# Patient Record
Sex: Male | Born: 1940
Health system: Southern US, Community
[De-identification: ages and names within clinical notes are randomized; demographics above are authoritative.]

## PROBLEM LIST (undated history)

## (undated) DIAGNOSIS — M48062 Spinal stenosis, lumbar region with neurogenic claudication: Secondary | ICD-10-CM

## (undated) DIAGNOSIS — Z974 Presence of external hearing-aid: Secondary | ICD-10-CM

## (undated) DIAGNOSIS — R011 Cardiac murmur, unspecified: Secondary | ICD-10-CM

## (undated) DIAGNOSIS — K5792 Diverticulitis of intestine, part unspecified, without perforation or abscess without bleeding: Secondary | ICD-10-CM

## (undated) DIAGNOSIS — M1710 Unilateral primary osteoarthritis, unspecified knee: Secondary | ICD-10-CM

## (undated) DIAGNOSIS — H9190 Unspecified hearing loss, unspecified ear: Secondary | ICD-10-CM

## (undated) DIAGNOSIS — M179 Osteoarthritis of knee, unspecified: Secondary | ICD-10-CM

## (undated) DIAGNOSIS — I10 Essential (primary) hypertension: Secondary | ICD-10-CM

## (undated) DIAGNOSIS — E785 Hyperlipidemia, unspecified: Secondary | ICD-10-CM

## (undated) DIAGNOSIS — R7303 Prediabetes: Secondary | ICD-10-CM

## (undated) DIAGNOSIS — K279 Peptic ulcer, site unspecified, unspecified as acute or chronic, without hemorrhage or perforation: Secondary | ICD-10-CM

## (undated) HISTORY — DX: Osteoarthritis of knee, unspecified: M17.9

## (undated) HISTORY — DX: Unspecified hearing loss, unspecified ear: H91.90

## (undated) HISTORY — DX: Unilateral primary osteoarthritis, unspecified knee: M17.10

## (undated) HISTORY — PX: OTHER SURGICAL HISTORY: SHX169

## (undated) HISTORY — DX: Hyperlipidemia, unspecified: E78.5

## (undated) HISTORY — DX: Essential (primary) hypertension: I10

---

## 1964-04-25 HISTORY — PX: TONSILLECTOMY AND ADENOIDECTOMY: SUR1326

## 2007-11-12 ENCOUNTER — Ambulatory Visit: Payer: Self-pay | Admitting: Gastroenterology

## 2008-04-25 HISTORY — PX: OTHER SURGICAL HISTORY: SHX169

## 2010-04-27 ENCOUNTER — Ambulatory Visit: Payer: Self-pay | Admitting: Internal Medicine

## 2010-05-26 HISTORY — PX: MENISCUS DEBRIDEMENT: SHX5178

## 2010-05-31 ENCOUNTER — Ambulatory Visit: Payer: Self-pay | Admitting: Anesthesiology

## 2010-06-02 ENCOUNTER — Ambulatory Visit: Payer: Self-pay | Admitting: General Practice

## 2010-07-25 HISTORY — PX: RETINAL LASER PROCEDURE: SHX2339

## 2010-08-06 ENCOUNTER — Ambulatory Visit: Payer: Self-pay | Admitting: Ophthalmology

## 2010-11-11 ENCOUNTER — Ambulatory Visit: Payer: Self-pay | Admitting: Ophthalmology

## 2010-11-17 ENCOUNTER — Ambulatory Visit: Payer: Self-pay | Admitting: Ophthalmology

## 2010-11-24 HISTORY — PX: CATARACT EXTRACTION W/ INTRAOCULAR LENS IMPLANT: SHX1309

## 2011-06-20 ENCOUNTER — Ambulatory Visit: Payer: Self-pay | Admitting: General Practice

## 2011-06-20 LAB — URINALYSIS, COMPLETE
Bilirubin,UR: NEGATIVE
Glucose,UR: NEGATIVE mg/dL (ref 0–75)
Ketone: NEGATIVE
Nitrite: NEGATIVE
Ph: 6 (ref 4.5–8.0)
Protein: NEGATIVE
RBC,UR: <1 /HPF (ref 0–5)
Specific Gravity: 1.014 (ref 1.003–1.030)
WBC UR: <1 /HPF (ref 0–5)

## 2011-06-20 LAB — BASIC METABOLIC PANEL
Calcium, Total: 9.2 mg/dL (ref 8.5–10.1)
Co2: 29 mmol/L (ref 21–32)
EGFR (African American): >60
EGFR (Non-African Amer.): >60
Glucose: 98 mg/dL (ref 65–99)
Osmolality: 278 (ref 275–301)
Potassium: 4 mmol/L (ref 3.5–5.1)
Sodium: 138 mmol/L (ref 136–145)

## 2011-06-20 LAB — CBC
HCT: 42.3 % (ref 40.0–52.0)
HGB: 14.1 g/dL (ref 13.0–18.0)
MCH: 28.8 pg (ref 26.0–34.0)
MCHC: 33.3 g/dL (ref 32.0–36.0)
MCV: 87 fL (ref 80–100)
RBC: 4.88 10*6/uL (ref 4.40–5.90)
RDW: 14.1 % (ref 11.5–14.5)
WBC: 8.4 10*3/uL (ref 3.8–10.6)

## 2011-06-20 LAB — APTT: Activated PTT: 26.2 secs (ref 23.6–35.9)

## 2011-06-21 LAB — URINE CULTURE

## 2011-06-24 HISTORY — PX: JOINT REPLACEMENT: SHX530

## 2011-07-06 ENCOUNTER — Inpatient Hospital Stay: Payer: Self-pay | Admitting: General Practice

## 2011-07-06 LAB — PLATELET COUNT: Platelet: 262 10*3/uL (ref 150–440)

## 2011-07-06 LAB — CREATININE, SERUM
EGFR (African American): >60
EGFR (Non-African Amer.): >60

## 2011-07-07 LAB — BASIC METABOLIC PANEL
Anion Gap: 8 (ref 7–16)
Calcium, Total: 8.2 mg/dL — ABNORMAL LOW (ref 8.5–10.1)
Chloride: 98 mmol/L (ref 98–107)
Co2: 29 mmol/L (ref 21–32)

## 2011-07-07 LAB — HEMOGLOBIN: HGB: 12 g/dL — ABNORMAL LOW (ref 13.0–18.0)

## 2011-07-07 LAB — PLATELET COUNT: Platelet: 221 10*3/uL (ref 150–440)

## 2011-07-08 LAB — BASIC METABOLIC PANEL
Anion Gap: 10 (ref 7–16)
Calcium, Total: 8.5 mg/dL (ref 8.5–10.1)
Chloride: 99 mmol/L (ref 98–107)
Co2: 28 mmol/L (ref 21–32)
Creatinine: 0.85 mg/dL (ref 0.60–1.30)
EGFR (African American): >60
EGFR (Non-African Amer.): >60
Glucose: 116 mg/dL — ABNORMAL HIGH (ref 65–99)
Osmolality: 275 (ref 275–301)
Potassium: 4 mmol/L (ref 3.5–5.1)
Sodium: 137 mmol/L (ref 136–145)

## 2011-07-27 ENCOUNTER — Encounter: Payer: Self-pay | Admitting: Internal Medicine

## 2011-07-27 ENCOUNTER — Ambulatory Visit (INDEPENDENT_AMBULATORY_CARE_PROVIDER_SITE_OTHER): Payer: Medicare Other | Admitting: Internal Medicine

## 2011-07-27 VITALS — BP 128/70 | HR 69 | Temp 98.5°F | Ht 70.5 in | Wt 249.0 lb

## 2011-07-27 DIAGNOSIS — I10 Essential (primary) hypertension: Secondary | ICD-10-CM

## 2011-07-27 DIAGNOSIS — E785 Hyperlipidemia, unspecified: Secondary | ICD-10-CM

## 2011-07-27 DIAGNOSIS — M1710 Unilateral primary osteoarthritis, unspecified knee: Secondary | ICD-10-CM

## 2011-07-27 DIAGNOSIS — M179 Osteoarthritis of knee, unspecified: Secondary | ICD-10-CM

## 2011-07-27 NOTE — Assessment & Plan Note (Signed)
BP Readings from Last 3 Encounters:  07/27/11 128/70   Good control No changes needed Will request and review past records

## 2011-07-27 NOTE — Patient Instructions (Signed)
Please request records from [facility]---3 years notes and labs, all consults and x-rays and procedures

## 2011-07-27 NOTE — Assessment & Plan Note (Signed)
Dicussed primary prevention Will continue the statin No sure he needs fish oil or niacin Will review next time

## 2011-07-27 NOTE — Progress Notes (Signed)
Subjective:    Patient ID: Evan Giles, male    DOB: Sep 19, 1940, 71 y.o.   MRN: [id number]  HPI Here to establish Had 3 different doctors in 3 years at [facility]  Has high blood pressure that goes back 3 years Does try to exercise regularly at [employer] (though not since knee surgery) Has tried to eat better---weight down 12# in past 3 months  Has high cholesterol Rx for about 3 years Had trouble with pravastatin and one other generic  Takes it only 3 days per week  Osteoarthritis in left knee Just had TKR 3 weeks ago--Dr [facility]  Current Outpatient Prescriptions on File Prior to Visit  Medication Sig Dispense Refill  . CRESTOR 5 MG tablet Take 5 mg by mouth daily.       Marland Kitchen lisinopril-hydrochlorothiazide (PRINZIDE,ZESTORETIC) 20-12.5 MG per tablet Take 1 tablet by mouth daily.        No Known Allergies  Past Medical History  Diagnosis Date  . Hyperlipidemia   . Hypertension   . Osteoarthrosis of knee     Past Surgical History  Procedure Date  . Meniscus debridement 2/12    Dr Dorise Bullion  . Joint replacement 3/12    Left knee---Dr Hooten  . Retinal laser procedure 4/12    Dr Monte Fantasia  . Cataract extraction w/ intraocular lens implant 8/12  . Tonsillectomy and adenoidectomy 1966    Family History  Problem Relation Age of Onset  . Heart disease Father   . Stroke Father     not certain about this  . Cancer Sister     unknown  . Diabetes Maternal Grandmother     History   Social History  . Marital Status: Married    Spouse Name: N/A    Number of Children: 2  . 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 Smoking status: Never Smoker   . Smokeless tobacco: Never Used  . Alcohol Use: No  . Drug Use: No  . Sexually Active: Not on file   Other Topics Concern  . Not on file   Social History Narrative   I son and 1 daughterNo living willRequests  wife as health care POAWould accept resuscitationProbably would accept feeding tube   Review of Systems  Constitutional: Negative for fatigue and unexpected weight change.       Has lost some weight trying to eat better  HENT: Positive for hearing loss. Negative for dental problem.        Regular with dentist  Eyes:       Retina surgery and cataract surgery on left eye  Respiratory: Negative for cough, chest tightness and shortness of breath.   Cardiovascular: Negative for chest pain, palpitations and leg swelling.  Gastrointestinal: Positive for constipation. Negative for nausea, vomiting and blood in stool.       Some constipation with the pain meds  Genitourinary: Negative for frequency and difficulty urinating.  Musculoskeletal: Positive for arthralgias. Negative for back pain.  Skin: Negative for rash.       Sees derm regularly Actinics Rx'd regularly  Neurological: Negative for dizziness, syncope, light-headedness and headaches.  Hematological: Negative for adenopathy. Does not bruise/bleed easily.  Psychiatric/Behavioral: Positive for sleep disturbance. Negative for dysphoric mood. The patient is not nervous/anxious.        Some sleep problems due to the surgery  Objective:   Physical Exam  Constitutional: He appears well-developed and well-nourished. No distress.  HENT:  Mouth/Throat: Oropharynx is clear and moist. No oropharyngeal exudate.  Eyes: Conjunctivae and EOM are normal. Pupils are equal, round, and reactive to light.  Neck: Normal range of motion. Neck supple. No thyromegaly present.  Cardiovascular: Normal rate, regular rhythm, normal heart sounds and intact distal pulses.  Exam reveals no gallop.   No murmur heard. Pulmonary/Chest: Effort normal and breath sounds normal. No respiratory distress. He has no wheezes. He has no rales.  Abdominal: Soft. There is no tenderness.  Musculoskeletal: He exhibits no edema and no tenderness.  Lymphadenopathy:    He  has no cervical adenopathy.  Skin: No rash noted. No erythema.       Multiple benign lesions--mostly seb keratoses, several nevi  Psychiatric: He has a normal mood and affect. His behavior is normal. Judgment and thought content normal.          Assessment & Plan:

## 2011-07-27 NOTE — Assessment & Plan Note (Signed)
Doing well post-op 

## 2011-08-23 ENCOUNTER — Encounter: Payer: Self-pay | Admitting: Internal Medicine

## 2012-04-04 ENCOUNTER — Encounter: Payer: Self-pay | Admitting: Internal Medicine

## 2012-04-04 ENCOUNTER — Ambulatory Visit (INDEPENDENT_AMBULATORY_CARE_PROVIDER_SITE_OTHER): Payer: Medicare Other | Admitting: Internal Medicine

## 2012-04-04 VITALS — BP 138/76 | HR 71 | Temp 98.4°F | Ht 71.0 in | Wt 262.0 lb

## 2012-04-04 DIAGNOSIS — I10 Essential (primary) hypertension: Secondary | ICD-10-CM

## 2012-04-04 DIAGNOSIS — E785 Hyperlipidemia, unspecified: Secondary | ICD-10-CM

## 2012-04-04 DIAGNOSIS — Z0000 Encounter for general adult medical examination without abnormal findings: Secondary | ICD-10-CM

## 2012-04-04 DIAGNOSIS — E669 Obesity, unspecified: Secondary | ICD-10-CM

## 2012-04-04 LAB — CBC WITH DIFFERENTIAL/PLATELET
Basophils Relative: 0.5 % (ref 0.0–3.0)
Eosinophils Absolute: 0.3 10*3/uL (ref 0.0–0.7)
HCT: 41.7 % (ref 39.0–52.0)
Lymphs Abs: 1.7 10*3/uL (ref 0.7–4.0)
MCHC: 33.9 g/dL (ref 30.0–36.0)
MCV: 85.6 fl (ref 78.0–100.0)
Monocytes Absolute: 0.7 10*3/uL (ref 0.1–1.0)
Neutrophils Relative %: 68.5 % (ref 43.0–77.0)
Platelets: 292 10*3/uL (ref 150.0–400.0)

## 2012-04-04 LAB — BASIC METABOLIC PANEL
BUN: 15 mg/dL (ref 6–23)
CO2: 29 mEq/L (ref 19–32)
Chloride: 103 mEq/L (ref 96–112)
Creatinine, Ser: 1 mg/dL (ref 0.4–1.5)

## 2012-04-04 LAB — HEPATIC FUNCTION PANEL
ALT: 13 U/L (ref 0–53)
Albumin: 4.2 g/dL (ref 3.5–5.2)
Total Bilirubin: 0.6 mg/dL (ref 0.3–1.2)

## 2012-04-04 LAB — TSH: TSH: 0.95 u[IU]/mL (ref 0.35–5.50)

## 2012-04-04 LAB — LDL CHOLESTEROL, DIRECT: Direct LDL: 135.9 mg/dL

## 2012-04-04 LAB — LIPID PANEL: Cholesterol: 194 mg/dL (ref 0–200)

## 2012-04-04 MED ORDER — LOSARTAN POTASSIUM-HCTZ 100-12.5 MG PO TABS: 1.0000 | ORAL_TABLET | Freq: Every day | ORAL | Status: DC

## 2012-04-04 NOTE — Assessment & Plan Note (Signed)
Discussed  Gave booklet--"On your Way to Fitness"

## 2012-04-04 NOTE — Assessment & Plan Note (Signed)
Healthy but out of shape Discussed PSA--he doesn't want to check UTD with colon and imms

## 2012-04-04 NOTE — Assessment & Plan Note (Signed)
BP Readings from Last 3 Encounters:  04/04/12 138/76  07/27/11 128/70   Will change to losartan due to cough
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
## 2012-04-04 NOTE — Assessment & Plan Note (Signed)
Takes crestor 3 days per week Check labs

## 2012-06-09 ENCOUNTER — Other Ambulatory Visit: Payer: Self-pay

## 2013-01-21 ENCOUNTER — Other Ambulatory Visit: Payer: Self-pay

## 2013-01-21 MED ORDER — ROSUVASTATIN CALCIUM 5 MG PO TABS: 5.0000 mg | ORAL_TABLET | Freq: Every day | ORAL | Status: DC

## 2013-01-21 NOTE — Telephone Encounter (Signed)
Pt request 90 day rx sent to [facility] 5 mg taking one tab daily. Advised done.

## 2013-02-28 ENCOUNTER — Other Ambulatory Visit: Payer: Self-pay

## 2013-04-04 ENCOUNTER — Encounter: Payer: Self-pay | Admitting: Internal Medicine

## 2013-04-04 ENCOUNTER — Ambulatory Visit (INDEPENDENT_AMBULATORY_CARE_PROVIDER_SITE_OTHER): Payer: Medicare Other | Admitting: Internal Medicine

## 2013-04-04 VITALS — BP 140/80 | HR 86 | Temp 98.5°F | Ht 71.0 in | Wt 257.0 lb

## 2013-04-04 DIAGNOSIS — I10 Essential (primary) hypertension: Secondary | ICD-10-CM

## 2013-04-04 DIAGNOSIS — R7303 Prediabetes: Secondary | ICD-10-CM | POA: Insufficient documentation

## 2013-04-04 DIAGNOSIS — Z0000 Encounter for general adult medical examination without abnormal findings: Secondary | ICD-10-CM

## 2013-04-04 DIAGNOSIS — E785 Hyperlipidemia, unspecified: Secondary | ICD-10-CM

## 2013-04-04 DIAGNOSIS — R7301 Impaired fasting glucose: Secondary | ICD-10-CM

## 2013-04-04 LAB — BASIC METABOLIC PANEL
BUN: 18 mg/dL (ref 6–23)
CO2: 30 mEq/L (ref 19–32)
Calcium: 9.1 mg/dL (ref 8.4–10.5)
Chloride: 103 mEq/L (ref 96–112)
GFR: 72.46 mL/min (ref >60.00)
Glucose, Bld: 115 mg/dL — ABNORMAL HIGH (ref 70–99)
Sodium: 139 mEq/L (ref 135–145)

## 2013-04-04 LAB — CBC WITH DIFFERENTIAL/PLATELET
Basophils Absolute: 0 10*3/uL (ref 0.0–0.1)
Eosinophils Absolute: 0.3 10*3/uL (ref 0.0–0.7)
HCT: 40.4 % (ref 39.0–52.0)
Hemoglobin: 13.4 g/dL (ref 13.0–17.0)
Lymphocytes Relative: 25.7 % (ref 12.0–46.0)
Lymphs Abs: 2.1 10*3/uL (ref 0.7–4.0)
MCHC: 33.3 g/dL (ref 30.0–36.0)
Monocytes Relative: 10.7 % (ref 3.0–12.0)
Platelets: 315 10*3/uL (ref 150.0–400.0)
RDW: 14 % (ref 11.5–14.6)

## 2013-04-04 LAB — HEMOGLOBIN A1C: Hgb A1c MFr Bld: 6.4 % (ref 4.6–6.5)

## 2013-04-04 LAB — HEPATIC FUNCTION PANEL
Albumin: 4.3 g/dL (ref 3.5–5.2)
Alkaline Phosphatase: 62 U/L (ref 39–117)
Total Protein: 7.2 g/dL (ref 6.0–8.3)

## 2013-04-04 LAB — LIPID PANEL
Cholesterol: 201 mg/dL — ABNORMAL HIGH (ref 0–200)
Total CHOL/HDL Ratio: 6

## 2013-04-04 LAB — LDL CHOLESTEROL, DIRECT: Direct LDL: 138.5 mg/dL

## 2013-04-04 LAB — TSH: TSH: 0.42 u[IU]/mL (ref 0.35–5.50)

## 2013-04-04 MED ORDER — LOSARTAN POTASSIUM-HCTZ 100-12.5 MG PO TABS: 1.0000 | ORAL_TABLET | Freq: Every day | ORAL | Status: DC

## 2013-04-04 NOTE — Assessment & Plan Note (Signed)
BP Readings from Last 3 Encounters:  04/04/13 140/80  04/04/12 138/76  07/27/11 128/70   Cough with lisinopril---thought he had itching with losartan so went back to lisinopril  Now wants to go back to losartan

## 2013-04-04 NOTE — Progress Notes (Signed)
Pre-visit discussion using our clinic review tool. No additional management support is needed unless otherwise documented below in the visit note.  

## 2013-04-04 NOTE — Patient Instructions (Signed)
DASH Diet  The DASH diet stands for "Dietary Approaches to Stop Hypertension." It is a healthy eating plan that has been shown to reduce high blood pressure (hypertension) in as little as 14 days, while also possibly providing other significant health benefits. These other health benefits include reducing the risk of breast cancer after menopause and reducing the risk of type 2 diabetes, heart disease, colon cancer, and stroke. Health benefits also include weight loss and slowing kidney failure in patients with chronic kidney disease.   DIET GUIDELINES  · Limit salt (sodium). Your diet should contain less than 1500 mg of sodium daily.  · Limit refined or processed carbohydrates. Your diet should include mostly whole grains. Desserts and added sugars should be used sparingly.  · Include small amounts of heart-healthy fats. These types of fats include nuts, oils, and tub margarine. Limit saturated and trans fats. These fats have been shown to be harmful in the body.  CHOOSING FOODS   The following food groups are based on a 2000 calorie diet. See your Registered Dietitian for individual calorie needs.  Grains and Grain Products (6 to 8 servings daily)  · Eat More Often: Whole-wheat bread, brown rice, whole-grain or wheat pasta, quinoa, popcorn without added fat or salt (air popped).  · Eat Less Often: White bread, white pasta, white rice, cornbread.  Vegetables (4 to 5 servings daily)  · Eat More Often: Fresh, frozen, and canned vegetables. Vegetables may be raw, steamed, roasted, or grilled with a minimal amount of fat.  · Eat Less Often/Avoid: Creamed or fried vegetables. Vegetables in a cheese sauce.  Fruit (4 to 5 servings daily)  · Eat More Often: All fresh, canned (in natural juice), or frozen fruits. Dried fruits without added sugar. One hundred percent fruit juice (½ cup [237 mL] daily).  · Eat Less Often: Dried fruits with added sugar. Canned fruit in light or heavy syrup.  Lean Meats, Fish, and Poultry (2  servings or less daily. One serving is 3 to 4 oz [85-114 g]).  · Eat More Often: Ninety percent or leaner ground beef, tenderloin, sirloin. Round cuts of beef, chicken breast, turkey breast. All fish. Grill, bake, or broil your meat. Nothing should be fried.  · Eat Less Often/Avoid: Fatty cuts of meat, turkey, or chicken leg, thigh, or wing. Fried cuts of meat or fish.  Dairy (2 to 3 servings)  · Eat More Often: Low-fat or fat-free milk, low-fat plain or light yogurt, reduced-fat or part-skim cheese.  · Eat Less Often/Avoid: Milk (whole, 2%). Whole milk yogurt. Full-fat cheeses.  Nuts, Seeds, and Legumes (4 to 5 servings per week)  · Eat More Often: All without added salt.  · Eat Less Often/Avoid: Salted nuts and seeds, canned beans with added salt.  Fats and Sweets (limited)  · Eat More Often: Vegetable oils, tub margarines without trans fats, sugar-free gelatin. Mayonnaise and salad dressings.  · Eat Less Often/Avoid: Coconut oils, palm oils, butter, stick margarine, cream, half and half, cookies, candy, pie.  FOR MORE INFORMATION  The Dash Diet Eating Plan: www.dashdiet.org  Document Released: 03/31/2011 Document Revised: 07/04/2011 Document Reviewed: 03/31/2011  Exit[employer]® Patient Information ©2014 [employer].

## 2013-04-04 NOTE — Assessment & Plan Note (Signed)
Will check A1c
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
## 2013-04-04 NOTE — Assessment & Plan Note (Addendum)
Healthy but still needs to work on fitness NO PSA due to age

## 2013-04-04 NOTE — Assessment & Plan Note (Signed)
No problems with statin 

## 2013-12-14 ENCOUNTER — Other Ambulatory Visit: Payer: Self-pay | Admitting: Internal Medicine

## 2014-04-07 ENCOUNTER — Ambulatory Visit (INDEPENDENT_AMBULATORY_CARE_PROVIDER_SITE_OTHER): Payer: Medicare Other | Admitting: Internal Medicine

## 2014-04-07 ENCOUNTER — Encounter: Payer: Self-pay | Admitting: Internal Medicine

## 2014-04-07 VITALS — BP 128/70 | HR 64 | Temp 98.5°F | Ht 71.5 in | Wt 261.0 lb

## 2014-04-07 DIAGNOSIS — R7301 Impaired fasting glucose: Secondary | ICD-10-CM

## 2014-04-07 DIAGNOSIS — I10 Essential (primary) hypertension: Secondary | ICD-10-CM

## 2014-04-07 DIAGNOSIS — Z23 Encounter for immunization: Secondary | ICD-10-CM

## 2014-04-07 DIAGNOSIS — Z0000 Encounter for general adult medical examination without abnormal findings: Secondary | ICD-10-CM

## 2014-04-07 DIAGNOSIS — E785 Hyperlipidemia, unspecified: Secondary | ICD-10-CM

## 2014-04-07 MED ORDER — ATORVASTATIN CALCIUM 20 MG PO TABS: 20.0000 mg | ORAL_TABLET | Freq: Every day | ORAL | Status: DC

## 2014-04-07 NOTE — Progress Notes (Signed)
Pre visit review using our clinic review tool, if applicable. No additional management support is needed unless otherwise documented below in the visit note. 

## 2014-04-07 NOTE — Addendum Note (Signed)
Addended by: Despina Hidden on: 04/07/2014 12:51 PM   Modules accepted: Orders, Medications

## 2014-04-07 NOTE — Assessment & Plan Note (Signed)
BP Readings from Last 3 Encounters:  04/07/14 128/70  04/04/13 140/80  04/04/12 138/76   Good control No changes needed

## 2014-04-07 NOTE — Patient Instructions (Addendum)
Please set up blood work in about 6 weeks--- Met C, CBC, free T4, lipid, A1c   DASH Eating Plan DASH stands for "Dietary Approaches to Stop Hypertension." The DASH eating plan is a healthy eating plan that has been shown to reduce high blood pressure (hypertension). Additional health benefits may include reducing the risk of type 2 diabetes mellitus, heart disease, and stroke. The DASH eating plan may also help with weight loss. WHAT DO I NEED TO KNOW ABOUT THE DASH EATING PLAN? For the DASH eating plan, you will follow these general guidelines:  Choose foods with a percent daily value for sodium of less than 5% (as listed on the food label).  Use salt-free seasonings or herbs instead of table salt or sea salt.  Check with your health care provider or pharmacist before using salt substitutes.  Eat lower-sodium products, often labeled as "lower sodium" or "no salt added."  Eat fresh foods.  Eat more vegetables, fruits, and low-fat dairy products.  Choose whole grains. Look for the word "whole" as the first word in the ingredient list.  Choose fish and skinless chicken or [place] more often than red meat. Limit fish, poultry, and meat to 6 oz (170 g) each day.  Limit sweets, desserts, sugars, and sugary drinks.  Choose heart-healthy fats.  Limit cheese to 1 oz (28 g) per day.  Eat more home-cooked food and less restaurant, buffet, and fast food.  Limit fried foods.  Cook foods using methods other than frying.  Limit canned vegetables. If you do use them, rinse them well to decrease the sodium.  When eating at a restaurant, ask that your food be prepared with less salt, or no salt if possible. WHAT FOODS CAN I EAT? Seek help from a dietitian for individual calorie needs. Grains Whole grain or whole wheat bread. Brown rice. Whole grain or whole wheat pasta. Quinoa, bulgur, and whole grain cereals. Low-sodium cereals. Corn or whole wheat flour tortillas. Whole grain cornbread.  Whole grain crackers. Low-sodium crackers. Vegetables Fresh or frozen vegetables (raw, steamed, roasted, or grilled). Low-sodium or reduced-sodium tomato and vegetable juices. Low-sodium or reduced-sodium tomato sauce and paste. Low-sodium or reduced-sodium canned vegetables.  Fruits All fresh, canned (in natural juice), or frozen fruits. Meat and Other Protein Products Ground beef (85% or leaner), grass-fed beef, or beef trimmed of fat. Skinless chicken or [place]. Ground chicken or [place]. Pork trimmed of fat. All fish and seafood. Eggs. Dried beans, peas, or lentils. Unsalted nuts and seeds. Unsalted canned beans. Dairy Low-fat dairy products, such as skim or 1% milk, 2% or reduced-fat cheeses, low-fat ricotta or cottage cheese, or plain low-fat yogurt. Low-sodium or reduced-sodium cheeses. Fats and Oils Tub margarines without trans fats. Light or reduced-fat mayonnaise and salad dressings (reduced sodium). Avocado. Safflower, olive, or canola oils. Natural peanut or almond butter. Other Unsalted popcorn and pretzels. The items listed above may not be a complete list of recommended foods or beverages. Contact your dietitian for more options. WHAT FOODS ARE NOT RECOMMENDED? Grains White bread. White pasta. White rice. Refined cornbread. Bagels and croissants. Crackers that contain trans fat. Vegetables Creamed or fried vegetables. Vegetables in a cheese sauce. Regular canned vegetables. Regular canned tomato sauce and paste. Regular tomato and vegetable juices. Fruits Dried fruits. Canned fruit in light or heavy syrup. Fruit juice. Meat and Other Protein Products Fatty cuts of meat. Ribs, chicken wings, bacon, sausage, bologna, salami, chitterlings, fatback, hot dogs, bratwurst, and packaged luncheon meats. Salted nuts and seeds. Canned  beans with salt. Dairy Whole or 2% milk, cream, half-and-half, and cream cheese. Whole-fat or sweetened yogurt. Full-fat cheeses or blue cheese. Nondairy  creamers and whipped toppings. Processed cheese, cheese spreads, or cheese curds. Condiments Onion and garlic salt, seasoned salt, table salt, and sea salt. Canned and packaged gravies. Worcestershire sauce. Tartar sauce. Barbecue sauce. Teriyaki sauce. Soy sauce, including reduced sodium. Steak sauce. Fish sauce. Oyster sauce. Cocktail sauce. Horseradish. Ketchup and mustard. Meat flavorings and tenderizers. Bouillon cubes. Hot sauce. Tabasco sauce. Marinades. Taco seasonings. Relishes. Fats and Oils Butter, stick margarine, lard, shortening, ghee, and bacon fat. Coconut, palm kernel, or palm oils. Regular salad dressings. Other Pickles and olives. Salted popcorn and pretzels. The items listed above may not be a complete list of foods and beverages to avoid. Contact your dietitian for more information. WHERE CAN I FIND MORE INFORMATION? National Heart, Lung, and Blood Institute: travelstabloid.com Document Released: 03/31/2011 Document Revised: 08/26/2013 Document Reviewed: 02/13/2013 [facility] Patient Information 2015 [place], [place]. This information is not intended to replace advice given to you by your health care provider. Make sure you discuss any questions you have with your health care provider.

## 2014-04-07 NOTE — Assessment & Plan Note (Signed)
Hopefully not diabetic  Will check labs soon

## 2014-04-07 NOTE — Assessment & Plan Note (Signed)
I have personally reviewed the Medicare Annual Wellness questionnaire and have noted 1. The patient's medical and social history 2. Their use of alcohol, tobacco or illicit drugs 3. Their current medications and supplements 4. The patient's functional ability including ADL's, fall risks, home safety risks and hearing or visual             impairment. 5. Diet and physical activities 6. Evidence for depression or mood disorders  The patients weight, height, BMI and visual acuity have been recorded in the chart I have made referrals, counseling and provided education to the patient based review of the above and I have provided the pt with a written personalized care plan for preventive services.  I have provided you with a copy of your personalized plan for preventive services. Please take the time to review along with your updated medication list.  Will give prevnar Colonoscopy due 2019 Discussed fitness and lifestyle No PSA due to age

## 2014-04-07 NOTE — Assessment & Plan Note (Signed)
He requests change to generic LDL was ~130 last year Will change to atorvastatin 20

## 2014-04-07 NOTE — Progress Notes (Signed)
Subjective:    Patient ID: Evan Giles, male    DOB: July 15, 1940, 73 y.o.   MRN: [id number]  HPI Here for Medicare wellness and physical Reviewed form and advanced directives Reviewed other physicians No alcohol or tobacco Does do some exercise 5 days per week. Plans to start "diet" Hearing is poor---got hearing aides Vision is fine Independent with all instrumental ADLs No cognitive problems No falls No depression or anhedonia  Discussed his sugars Needs to work on his eating Fairly consistent with exercise  Current Outpatient Prescriptions on File Prior to Visit  Medication Sig Dispense Refill  . aspirin 81 MG tablet Take 81 mg by mouth daily.    . CRESTOR 5 MG tablet TAKE ONE TABLET BY MOUTH EVERY DAY 90 tablet 0  . losartan-hydrochlorothiazide (HYZAAR) 100-12.5 MG per tablet Take 1 tablet by mouth daily. 90 tablet 3   No current facility-administered medications on file prior to visit.    No Known Allergies  Past Medical History  Diagnosis Date  . Hyperlipidemia   . Hypertension   . Osteoarthrosis of knee     Past Surgical History  Procedure Laterality Date  . Meniscus debridement  2/12    Dr Roberts Gaudy  . Joint replacement  3/13    Left knee---Dr Hooten  . Retinal laser procedure  4/12    Dr Darrick Grinder  . Cataract extraction w/ intraocular lens implant  8/12  . Tonsillectomy and adenoidectomy  1966    Family History  Problem Relation Age of Onset  . Heart disease Father   . Stroke Father     not certain about this  . Cancer Sister     unknown  . Diabetes Maternal Grandmother     History   Social History  . Marital Status: Married    Spouse Name: N/A    Number of Children: 2  . 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 Smoking status: Never Smoker   . Smokeless tobacco: Never Used  . Alcohol Use: No  . Drug Use: No  . Sexual  Activity: Not on file   Other Topics Concern  . Not on file   Social History Narrative   I son and 1 daughter   No living will   Requests wife as health care POA--plans to formalize   Would accept resuscitation   Probably would accept feeding tube--at least for a limited time   Review of Systems  Constitutional: Negative for fatigue and unexpected weight change.  HENT: Negative for dental problem, hearing loss and tinnitus.        Regular with dentist  Eyes: Negative for visual disturbance.       No diplopia or unilateral vision loss  Respiratory: Negative for cough, chest tightness and shortness of breath.   Cardiovascular: Negative for chest pain, palpitations and leg swelling.  Gastrointestinal: Negative for nausea and vomiting.       Rare heartburn --tums helps (once a month or less) Colonoscopy 4/09--benign  Endocrine: Negative for polydipsia and polyuria.  Genitourinary: Negative for urgency, frequency and difficulty urinating.       No sexual problems  Musculoskeletal: Negative for back pain, joint swelling and arthralgias.  Skin: Negative for rash.       Sees Dr Phillip Heal for derm---excision of benign lesion on back in past year  Allergic/Immunologic: Negative for environmental allergies and immunocompromised state.  Neurological: Negative for dizziness, syncope, weakness, light-headedness, numbness and headaches.  Hematological: Negative for adenopathy. Does not bruise/bleed easily.  Psychiatric/Behavioral: Negative for suicidal ideas and dysphoric mood. The patient is not nervous/anxious.        Objective:   Physical Exam  Constitutional: He is oriented to person, place, and time. He appears well-developed and well-nourished. No distress.  HENT:  Head: Normocephalic and atraumatic.  Right Ear: External ear normal.  Left Ear: External ear normal.  Mouth/Throat: Oropharynx is clear and moist. No oropharyngeal exudate.  Eyes: Conjunctivae and EOM are normal. Pupils are  equal, round, and reactive to light.  Neck: Normal range of motion. Neck supple. No thyromegaly present.  Cardiovascular: Normal rate, regular rhythm, normal heart sounds and intact distal pulses.  Exam reveals no gallop.   No murmur heard. Pulmonary/Chest: Effort normal and breath sounds normal. No respiratory distress. He has no wheezes. He has no rales.  Abdominal: Soft. There is no tenderness.  Musculoskeletal: He exhibits no edema or tenderness.  Lymphadenopathy:    He has no cervical adenopathy.  Neurological: He is alert and oriented to person, place, and time.  President-- "Obama, Bush, CLinton" [phone] [id number] Recall 3/3  Skin: No rash noted. No erythema.  Psychiatric: He has a normal mood and affect. His behavior is normal.          Assessment & Plan:

## 2014-04-29 ENCOUNTER — Other Ambulatory Visit: Payer: Self-pay | Admitting: Internal Medicine

## 2014-04-29 DIAGNOSIS — M5412 Radiculopathy, cervical region: Secondary | ICD-10-CM | POA: Diagnosis not present

## 2014-04-29 DIAGNOSIS — M9903 Segmental and somatic dysfunction of lumbar region: Secondary | ICD-10-CM | POA: Diagnosis not present

## 2014-04-29 DIAGNOSIS — M5136 Other intervertebral disc degeneration, lumbar region: Secondary | ICD-10-CM | POA: Diagnosis not present

## 2014-04-29 DIAGNOSIS — M9901 Segmental and somatic dysfunction of cervical region: Secondary | ICD-10-CM | POA: Diagnosis not present

## 2014-05-19 ENCOUNTER — Other Ambulatory Visit: Payer: Self-pay | Admitting: Internal Medicine

## 2014-05-19 ENCOUNTER — Other Ambulatory Visit: Payer: Medicare Other

## 2014-05-19 DIAGNOSIS — I10 Essential (primary) hypertension: Secondary | ICD-10-CM

## 2014-06-03 DIAGNOSIS — M5412 Radiculopathy, cervical region: Secondary | ICD-10-CM | POA: Diagnosis not present

## 2014-06-03 DIAGNOSIS — M9903 Segmental and somatic dysfunction of lumbar region: Secondary | ICD-10-CM | POA: Diagnosis not present

## 2014-06-03 DIAGNOSIS — M9901 Segmental and somatic dysfunction of cervical region: Secondary | ICD-10-CM | POA: Diagnosis not present

## 2014-06-03 DIAGNOSIS — M5136 Other intervertebral disc degeneration, lumbar region: Secondary | ICD-10-CM | POA: Diagnosis not present

## 2014-07-02 DIAGNOSIS — M9901 Segmental and somatic dysfunction of cervical region: Secondary | ICD-10-CM | POA: Diagnosis not present

## 2014-07-02 DIAGNOSIS — M5412 Radiculopathy, cervical region: Secondary | ICD-10-CM | POA: Diagnosis not present

## 2014-07-02 DIAGNOSIS — M5136 Other intervertebral disc degeneration, lumbar region: Secondary | ICD-10-CM | POA: Diagnosis not present

## 2014-07-02 DIAGNOSIS — M9903 Segmental and somatic dysfunction of lumbar region: Secondary | ICD-10-CM | POA: Diagnosis not present

## 2014-07-30 DIAGNOSIS — M5136 Other intervertebral disc degeneration, lumbar region: Secondary | ICD-10-CM | POA: Diagnosis not present

## 2014-07-30 DIAGNOSIS — M9903 Segmental and somatic dysfunction of lumbar region: Secondary | ICD-10-CM | POA: Diagnosis not present

## 2014-07-30 DIAGNOSIS — M9901 Segmental and somatic dysfunction of cervical region: Secondary | ICD-10-CM | POA: Diagnosis not present

## 2014-07-30 DIAGNOSIS — M5412 Radiculopathy, cervical region: Secondary | ICD-10-CM | POA: Diagnosis not present

## 2014-08-11 DIAGNOSIS — D2371 Other benign neoplasm of skin of right lower limb, including hip: Secondary | ICD-10-CM | POA: Diagnosis not present

## 2014-08-11 DIAGNOSIS — L821 Other seborrheic keratosis: Secondary | ICD-10-CM | POA: Diagnosis not present

## 2014-08-17 NOTE — Discharge Summary (Signed)
PATIENT NAME:  Evan Giles, Evan Giles MR#:  [id number] DATE OF BIRTH:  [birth year]  DATE OF ADMISSION:  07/06/2011 DATE OF DISCHARGE:  07/09/2011  ADMITTING DIAGNOSIS: Degenerative arthrosis of the left knee.   DISCHARGE DIAGNOSIS: Degenerative arthrosis of the left knee.   HISTORY: The patient is a pleasant 74 year old who has been followed at [facility] for progression of left knee discomfort. The patient is status post left knee arthroscopy for a partial medial meniscectomy as well as chondroplasty. At that time he was noted to have grade IV changes of chondromalacia to the medial compartment space and grade 3 changes to the patellofemoral compartment. He had reported relative constant pain and swelling of the knee that was aggravated with activities. His pain was noted to be increased with walking and prolonged standing. He had localized most of the pain along the medial aspect of the knee. At the time of surgery, he was not using any ambulatory aid. The patient had denied any gross locking or giving away of the knee. He had not seen any significant improvement in his condition despite activity modification as well as over-the-counter anti-inflammatory medications. The pain had increased to the point that it was significantly interfering with his activities of daily living. X-rays taken in the clinic showed narrowing of the medial cartilage space with associated varus alignment. Osteophyte formation was noted as well as subchondral sclerosis. After a discussion of the risks and benefits of surgical intervention, the patient expressed his understanding of the risks and benefits and agreed for plans for surgical intervention.   PROCEDURE: Left total knee arthroplasty using computer-assisted navigation.   ANESTHESIA:  Femoral nerve block with spinal.   SOFT TISSUE RELEASE: Anterior cruciate ligament, posterior cruciate ligament, deep and superficial medial collateral ligament, as well as the  patellofemoral ligament.   IMPLANTS UTILIZED: DePuy PFC Sigma size five posterior stabilized femoral component (cemented), size six MBT tibial component (cemented), 41 mm three pegged oval dome patella (cemented), and a 10 mm stabilized rotating platform polyethylene insert.   HOSPITAL COURSE: The patient tolerated the procedure very well. He had no complications. He was then taken to the PAC-U where he was stabilized and then transferred to the orthopedic floor. The patient began receiving anticoagulation therapy of Lovenox 30 mg subcutaneous every 12 hours per anesthesia and pharmacy protocol. He was fitted with TED stockings bilaterally. These were allowed to be removed one hour per eight hour shift. The left one was applied on day two following removal of the Hemovac and dressing change. He was also fitted with the AV-I compression foot pump set at 80 mmHg. His calves have been nontender, free of any evidence of any deep venous thromboses. Negative Homans sign. Heels were elevated off the bed using rolled towels. Heels have been nontender.   The patient has denied any chest pain or shortness of breath. Vital signs have been stable. He has been afebrile. Hemodynamically, he was stable. No transfusions were given other than the Autovac transfusion given the first six hours postoperatively.   Physical therapy was initiated on day one for gait training and transfers. He has done extremely well. Upon being discharged, was ambulating greater than 200 feet. He was able to go up and down four sets of steps. He was independent with bed to chair transfers. Occupational therapy was also initiated on day one for activities of daily living and assistive devices.   DISPOSITION: The patient was noted to be nauseous on several occasions during the hospital course, but  not on a constant basis. Upon being discharged, this appeared to have cleared.   The patient's IV, catheter, and Hemovac were all discontinued on day  two along with a dressing change. The Polar Care was reapplied to the surgical leg maintaining a temperature of 40 to 50 degrees Fahrenheit. The wound has been free of any drainage or any signs of infection.   DISPOSITION: The patient is being discharged to home in improved stable condition.   DISCHARGE INSTRUCTIONS:  1. He will continue weight-bearing as tolerated.  2. Continue to use a walker until cleared by physical therapy to go to a quad cane.  3. He will wear TED stockings during the day but may remove these at night.  4. He will receive home health physical therapy.  5. He is placed on a regular diet.  6. He is to resume his regular medications that he was on prior to admission.  7. He was given a prescription for Lovenox 40 mg subcutaneously daily for 14 days, then discontinue and begin taking one 81 mg enteric-coated aspirin.  8. He was given a prescription for oxycodone 1 to 2 tablets every 4 to 6 hours p.r.n. for pain.  9. The patient has a follow-up appointment in two weeks for dressing change. He is to call the clinic sooner if any temperatures of 101.5 or greater or excessive bleeding.   PAST MEDICAL HISTORY:  1. Chickenpox. 2. Hypertension.  3. Hyperlipidemia.   ____________________________ Vance Peper, PA jrw:ap D: 07/09/2011 07:54:25 ET T: 07/09/2011 14:14:58 ET JOB#: [id number]  cc: Vance Peper, PA, <Dictator> Earnesteen Birnie PA ELECTRONICALLY SIGNED 07/12/2011 20:52

## 2014-08-17 NOTE — Op Note (Signed)
PATIENT NAME:  Evan Giles, Evan Giles MR#:  [id number] DATE OF BIRTH:  Mar 01, 1941  DATE OF PROCEDURE:  07/06/2011  PREOPERATIVE DIAGNOSIS: Degenerative arthrosis of the left knee.   POSTOPERATIVE DIAGNOSIS: Degenerative arthrosis of the left  knee.   PROCEDURE PERFORMED: Left total knee arthroplasty using computer-assisted navigation.   SURGEON: Laurice Record. Holley Bouche., MD  ASSISTANT: Vance Peper, PA-C (required to maintain retraction throughout the procedure)   ANESTHESIA: Femoral nerve block and spinal.   ESTIMATED BLOOD LOSS: 50 mL.   FLUIDS REPLACED: 1300 mL of crystalloid.   TOURNIQUET TIME: 100 minutes.   DRAINS: Two medium drains to reinfusion system.   SOFT TISSUE RELEASES: Anterior cruciate ligament, posterior cruciate ligament, deep and superficial medial collateral ligament, patellofemoral ligament.   IMPLANTS UTILIZED: DePuy PFC Sigma size 5 posterior stabilized femoral component (cemented), size 6 MBT tibial component (cemented), 41 mm three-peg oval dome patella (cemented), and a 10 mm stabilized rotating platform polyethylene insert.   INDICATIONS FOR SURGERY: The patient is a 74 year old male who has been seen for complaints of progressive left knee pain. X-rays demonstrated significant degenerative changes with tricompartmental arthrosis and relative varus deformity. After a discussion of the risks and benefits of surgical intervention, the patient expressed understanding of the risks and benefits and agreed with plans for surgical intervention.   PROCEDURE IN DETAIL: The patient was brought into the Operating Room, and after adequate femoral nerve block and spinal anesthesia was achieved, a tourniquet was placed on the patient's upper left thigh. The patient's left knee and leg were cleaned and prepped with alcohol and DuraPrep and draped in the usual sterile fashion. A "timeout" was performed as per usual protocol. The left lower extremity was exsanguinated using an Esmarch,  and the tourniquet was inflated to 300 mmHg. An anterior longitudinal incision was made, followed by a standard mid vastus approach. A small effusion was evacuated. The deep fibers of the medial collateral ligament were elevated in a subperiosteal fashion off the medial flare of the tibia so as to maintain a continuous soft tissue sleeve. The patella was subluxed laterally and the patellofemoral ligament was incised. Inspection of the knee demonstrated severe degenerative changes most notably to the medial compartment. Anterior and posterior cruciate ligaments were excised. Two 4.0 mm Schanz pins were inserted into the femur and into the tibia for attachment of the array of spheres used for computer-assisted navigation. Hip center was identified using the circumduction technique. Distal landmarks were mapped using the computer. The distal femur and proximal tibia were mapped using the computer. The distal femoral cutting guide was positioned using computer-assisted navigation so as to achieve a 5-degree distal valgus cut. Cut was performed and verified using the computer. The distal femur was sized, and it was felt that a size 5 femoral component was appropriate. A size 5 cutting guide was positioned using computer-assisted navigation, and the anterior cut was performed and verified using the computer. This was followed by completion of the posterior and chamfer cuts. The cutting guide for the femoral box was positioned, and a femoral box cut was performed.   Attention was then directed to the proximal tibia. Medial and lateral menisci were excised. The extramedullary tibial cutting guide was positioned using computer-assisted navigation so as to achieve 0-degree varus-valgus alignment and 0-degree posterior slope. Cut was performed and verified using the computer. The proximal tibia was sized, and it was felt that a size 6 tibial tray was appropriate. Tibial and femoral trials were inserted, followed by insertion  of a 10 mm polyethylene insert. The knee was felt to be tight medially. A Cobb elevator was used to elevate the superficial fibers of the medial collateral ligament. This allowed for good medial and lateral soft tissue balancing both in full extension and in 90 degrees of flexion. Finally, the patella was cut and prepared so as to accommodate a 41 mm three-peg oval dome patella. Patellar trial was placed, and the knee was placed through a range of motion. Excellent patellar tracking was appreciated. The femoral trial was removed after debridement of osteophyte off of the posterior medial femoral condyle. The central post hole for the tibial component was reamed followed by insertion of a keel punch. The tibial trial was then removed. Cut surfaces of bone were irrigated with copious amounts of normal saline with antibiotic solution using pulsatile lavage and then suctioned dry. Polymethylmethacrylate cement was prepared in the usual fashion using a vacuum mixer. Cement was applied to the cut surface of the proximal tibia as well as along the undersurface of a size 6 MBT tibial component. The tibial component was positioned and impacted into place. Excess cement was removed using freer elevators. Cement was then applied to the cut surface of the femur as well as along the posterior flanges of a size 5 posterior stabilized femoral component. The femoral component was positioned and impacted into place. Excess cement was removed using freer elevators. A 10 mm polyethylene trial was inserted, and the knee was brought in full extension with steady axial compression applied. Finally, cement was applied to the backside of a 41 mm three-peg oval dome patella, and the patellar component was positioned and patellar clamp applied. Excess cement was removed using freer elevators.   After adequate curing of cement, the tourniquet was deflated after a total tourniquet time of 100 minutes. Good hemostasis was appreciated. The  knee was irrigated with copious amounts of normal saline with antibiotic solution using pulsatile lavage and then suctioned dry. The knee was inspected for any residual cement debris, and 30 mL of 0.25% Marcaine with epinephrine was injected along the posterior capsule. A 10 mm stabilized rotating platform polyethylene insert was inserted, and the knee was placed through a range of motion. Excellent patellar tracking was appreciated, and good medial and lateral soft tissue balancing was appreciated both in full extension and in 90 degrees of flexion.   Two medium drains were placed in the wound bed and brought out through a separate stab incision to be attached to a reinfusion system. The medial parapatellar portion of the incision was reapproximated using interrupted sutures of #1 Vicryl. The subcutaneous tissue was approximated in layers using first #0 Vicryl, followed by #2-0 Vicryl. Skin was closed with skin staples. A sterile dressing was applied.   The patient tolerated the procedure well. He was transported to the recovery room in stable condition.    ____________________________ Laurice Record. Holley Bouche., MD [email or web address]:cbb D: 07/06/2011 13:55:20 ET T: 07/06/2011 16:06:24 ET JOB#: [id number]  cc: Jeneen Rinks P. Holley Bouche., MD, <Dictator> Laurice Record Holley Bouche MD ELECTRONICALLY SIGNED 07/07/2011 18:10

## 2014-08-26 DIAGNOSIS — Z96652 Presence of left artificial knee joint: Secondary | ICD-10-CM | POA: Diagnosis not present

## 2014-08-26 DIAGNOSIS — M25552 Pain in left hip: Secondary | ICD-10-CM | POA: Diagnosis not present

## 2014-09-04 DIAGNOSIS — M5136 Other intervertebral disc degeneration, lumbar region: Secondary | ICD-10-CM | POA: Diagnosis not present

## 2014-09-04 DIAGNOSIS — M5412 Radiculopathy, cervical region: Secondary | ICD-10-CM | POA: Diagnosis not present

## 2014-09-04 DIAGNOSIS — M9903 Segmental and somatic dysfunction of lumbar region: Secondary | ICD-10-CM | POA: Diagnosis not present

## 2014-09-04 DIAGNOSIS — M9901 Segmental and somatic dysfunction of cervical region: Secondary | ICD-10-CM | POA: Diagnosis not present

## 2014-09-16 DIAGNOSIS — Z961 Presence of intraocular lens: Secondary | ICD-10-CM | POA: Diagnosis not present

## 2014-10-01 DIAGNOSIS — M9903 Segmental and somatic dysfunction of lumbar region: Secondary | ICD-10-CM | POA: Diagnosis not present

## 2014-10-01 DIAGNOSIS — M5136 Other intervertebral disc degeneration, lumbar region: Secondary | ICD-10-CM | POA: Diagnosis not present

## 2014-10-01 DIAGNOSIS — M5412 Radiculopathy, cervical region: Secondary | ICD-10-CM | POA: Diagnosis not present

## 2014-10-01 DIAGNOSIS — M9901 Segmental and somatic dysfunction of cervical region: Secondary | ICD-10-CM | POA: Diagnosis not present

## 2014-10-29 DIAGNOSIS — M9903 Segmental and somatic dysfunction of lumbar region: Secondary | ICD-10-CM | POA: Diagnosis not present

## 2014-10-29 DIAGNOSIS — M5136 Other intervertebral disc degeneration, lumbar region: Secondary | ICD-10-CM | POA: Diagnosis not present

## 2014-10-29 DIAGNOSIS — M5412 Radiculopathy, cervical region: Secondary | ICD-10-CM | POA: Diagnosis not present

## 2014-10-29 DIAGNOSIS — M9901 Segmental and somatic dysfunction of cervical region: Secondary | ICD-10-CM | POA: Diagnosis not present

## 2014-11-06 DIAGNOSIS — M5412 Radiculopathy, cervical region: Secondary | ICD-10-CM | POA: Diagnosis not present

## 2014-11-06 DIAGNOSIS — M9903 Segmental and somatic dysfunction of lumbar region: Secondary | ICD-10-CM | POA: Diagnosis not present

## 2014-11-06 DIAGNOSIS — M5136 Other intervertebral disc degeneration, lumbar region: Secondary | ICD-10-CM | POA: Diagnosis not present

## 2014-11-06 DIAGNOSIS — M9901 Segmental and somatic dysfunction of cervical region: Secondary | ICD-10-CM | POA: Diagnosis not present

## 2014-11-17 DIAGNOSIS — S61411A Laceration without foreign body of right hand, initial encounter: Secondary | ICD-10-CM | POA: Diagnosis not present

## 2014-11-17 DIAGNOSIS — Z23 Encounter for immunization: Secondary | ICD-10-CM | POA: Diagnosis not present

## 2014-11-25 DIAGNOSIS — M5412 Radiculopathy, cervical region: Secondary | ICD-10-CM | POA: Diagnosis not present

## 2014-11-25 DIAGNOSIS — M9901 Segmental and somatic dysfunction of cervical region: Secondary | ICD-10-CM | POA: Diagnosis not present

## 2014-11-25 DIAGNOSIS — M5136 Other intervertebral disc degeneration, lumbar region: Secondary | ICD-10-CM | POA: Diagnosis not present

## 2014-11-25 DIAGNOSIS — M9903 Segmental and somatic dysfunction of lumbar region: Secondary | ICD-10-CM | POA: Diagnosis not present

## 2014-11-27 DIAGNOSIS — S61411A Laceration without foreign body of right hand, initial encounter: Secondary | ICD-10-CM | POA: Diagnosis not present

## 2014-12-30 DIAGNOSIS — M5412 Radiculopathy, cervical region: Secondary | ICD-10-CM | POA: Diagnosis not present

## 2014-12-30 DIAGNOSIS — M9903 Segmental and somatic dysfunction of lumbar region: Secondary | ICD-10-CM | POA: Diagnosis not present

## 2014-12-30 DIAGNOSIS — M5136 Other intervertebral disc degeneration, lumbar region: Secondary | ICD-10-CM | POA: Diagnosis not present

## 2014-12-30 DIAGNOSIS — M9901 Segmental and somatic dysfunction of cervical region: Secondary | ICD-10-CM | POA: Diagnosis not present

## 2015-01-28 DIAGNOSIS — D485 Neoplasm of uncertain behavior of skin: Secondary | ICD-10-CM | POA: Diagnosis not present

## 2015-01-28 DIAGNOSIS — C44629 Squamous cell carcinoma of skin of left upper limb, including shoulder: Secondary | ICD-10-CM | POA: Diagnosis not present

## 2015-01-28 DIAGNOSIS — L249 Irritant contact dermatitis, unspecified cause: Secondary | ICD-10-CM | POA: Diagnosis not present

## 2015-01-28 DIAGNOSIS — Z872 Personal history of diseases of the skin and subcutaneous tissue: Secondary | ICD-10-CM | POA: Diagnosis not present

## 2015-01-28 DIAGNOSIS — Z1283 Encounter for screening for malignant neoplasm of skin: Secondary | ICD-10-CM | POA: Diagnosis not present

## 2015-02-03 DIAGNOSIS — M9903 Segmental and somatic dysfunction of lumbar region: Secondary | ICD-10-CM | POA: Diagnosis not present

## 2015-02-03 DIAGNOSIS — M9901 Segmental and somatic dysfunction of cervical region: Secondary | ICD-10-CM | POA: Diagnosis not present

## 2015-02-03 DIAGNOSIS — M5136 Other intervertebral disc degeneration, lumbar region: Secondary | ICD-10-CM | POA: Diagnosis not present

## 2015-02-03 DIAGNOSIS — M5412 Radiculopathy, cervical region: Secondary | ICD-10-CM | POA: Diagnosis not present

## 2015-03-16 DIAGNOSIS — C44629 Squamous cell carcinoma of skin of left upper limb, including shoulder: Secondary | ICD-10-CM | POA: Diagnosis not present

## 2015-03-16 DIAGNOSIS — C44622 Squamous cell carcinoma of skin of right upper limb, including shoulder: Secondary | ICD-10-CM | POA: Diagnosis not present

## 2015-04-02 DIAGNOSIS — M9901 Segmental and somatic dysfunction of cervical region: Secondary | ICD-10-CM | POA: Diagnosis not present

## 2015-04-02 DIAGNOSIS — M5412 Radiculopathy, cervical region: Secondary | ICD-10-CM | POA: Diagnosis not present

## 2015-04-02 DIAGNOSIS — M5136 Other intervertebral disc degeneration, lumbar region: Secondary | ICD-10-CM | POA: Diagnosis not present

## 2015-04-02 DIAGNOSIS — M9903 Segmental and somatic dysfunction of lumbar region: Secondary | ICD-10-CM | POA: Diagnosis not present

## 2015-04-14 ENCOUNTER — Encounter: Payer: Self-pay | Admitting: Internal Medicine

## 2015-04-14 ENCOUNTER — Ambulatory Visit (INDEPENDENT_AMBULATORY_CARE_PROVIDER_SITE_OTHER): Payer: Medicare Other | Admitting: Internal Medicine

## 2015-04-14 VITALS — BP 138/70 | HR 61 | Temp 97.8°F | Ht 72.0 in | Wt 260.0 lb

## 2015-04-14 DIAGNOSIS — Z0000 Encounter for general adult medical examination without abnormal findings: Secondary | ICD-10-CM | POA: Diagnosis not present

## 2015-04-14 DIAGNOSIS — R7301 Impaired fasting glucose: Secondary | ICD-10-CM | POA: Diagnosis not present

## 2015-04-14 DIAGNOSIS — I10 Essential (primary) hypertension: Secondary | ICD-10-CM | POA: Diagnosis not present

## 2015-04-14 DIAGNOSIS — Z7189 Other specified counseling: Secondary | ICD-10-CM

## 2015-04-14 DIAGNOSIS — E785 Hyperlipidemia, unspecified: Secondary | ICD-10-CM

## 2015-04-14 LAB — COMPREHENSIVE METABOLIC PANEL
ALT: 17 U/L (ref 0–53)
AST: 22 U/L (ref 0–37)
Albumin: 4.2 g/dL (ref 3.5–5.2)
Alkaline Phosphatase: 77 U/L (ref 39–117)
BUN: 21 mg/dL (ref 6–23)
CALCIUM: 9.3 mg/dL (ref 8.4–10.5)
CHLORIDE: 102 meq/L (ref 96–112)
CO2: 29 meq/L (ref 19–32)
Creatinine, Ser: 0.91 mg/dL (ref 0.40–1.50)
GFR: 86.86 mL/min (ref >60.00)
Glucose, Bld: 102 mg/dL — ABNORMAL HIGH (ref 70–99)
POTASSIUM: 4.3 meq/L (ref 3.5–5.1)
Sodium: 137 mEq/L (ref 135–145)
Total Bilirubin: 0.5 mg/dL (ref 0.2–1.2)
Total Protein: 7.1 g/dL (ref 6.0–8.3)

## 2015-04-14 LAB — LIPID PANEL
CHOLESTEROL: 185 mg/dL (ref 0–200)
HDL: 35.1 mg/dL — ABNORMAL LOW (ref >39.00)
NonHDL: 149.75
TRIGLYCERIDES: 207 mg/dL — AB (ref 0.0–149.0)
Total CHOL/HDL Ratio: 5
VLDL: 41.4 mg/dL — AB (ref 0.0–40.0)

## 2015-04-14 LAB — LDL CHOLESTEROL, DIRECT: LDL DIRECT: 122 mg/dL

## 2015-04-14 LAB — CBC WITH DIFFERENTIAL/PLATELET
BASOS ABS: 0 10*3/uL (ref 0.0–0.1)
Basophils Relative: 0.6 % (ref 0.0–3.0)
EOS ABS: 0.3 10*3/uL (ref 0.0–0.7)
EOS PCT: 4 % (ref 0.0–5.0)
HCT: 43 % (ref 39.0–52.0)
HEMOGLOBIN: 14.1 g/dL (ref 13.0–17.0)
LYMPHS ABS: 1.9 10*3/uL (ref 0.7–4.0)
LYMPHS PCT: 23.8 % (ref 12.0–46.0)
MCHC: 32.7 g/dL (ref 30.0–36.0)
MCV: 86.1 fl (ref 78.0–100.0)
MONO ABS: 0.8 10*3/uL (ref 0.1–1.0)
Monocytes Relative: 10.3 % (ref 3.0–12.0)
NEUTROS PCT: 61.3 % (ref 43.0–77.0)
Neutro Abs: 5 10*3/uL (ref 1.4–7.7)
Platelets: 296 10*3/uL (ref 150.0–400.0)
RBC: 4.99 Mil/uL (ref 4.22–5.81)
RDW: 14 % (ref 11.5–15.5)
WBC: 8.2 10*3/uL (ref 4.0–10.5)

## 2015-04-14 LAB — HEMOGLOBIN A1C: Hgb A1c MFr Bld: 6.5 % (ref 4.6–6.5)

## 2015-04-14 NOTE — Assessment & Plan Note (Signed)
BP Readings from Last 3 Encounters:  04/14/15 138/70  04/07/14 128/70  04/04/13 140/80   Good control No change needed

## 2015-04-14 NOTE — Assessment & Plan Note (Signed)
I have personally reviewed the Medicare Annual Wellness questionnaire and have noted 1. The patient's medical and social history 2. Their use of alcohol, tobacco or illicit drugs 3. Their current medications and supplements 4. The patient's functional ability including ADL's, fall risks, home safety risks and hearing or visual             impairment. 5. Diet and physical activities 6. Evidence for depression or mood disorders  The patients weight, height, BMI and visual acuity have been recorded in the chart I have made referrals, counseling and provided education to the patient based review of the above and I have provided the pt with a written personalized care plan for preventive services.  I have provided you with a copy of your personalized plan for preventive services. Please take the time to review along with your updated medication list.  UTD on imms Colon due 2019 No PSA due to age Discussed fitness, etc

## 2015-04-14 NOTE — Assessment & Plan Note (Signed)
Will check labs

## 2015-04-14 NOTE — Progress Notes (Signed)
Pre visit review using our clinic review tool, if applicable. No additional management support is needed unless otherwise documented below in the visit note. 

## 2015-04-14 NOTE — Patient Instructions (Signed)
DASH Eating Plan  DASH stands for "Dietary Approaches to Stop Hypertension." The DASH eating plan is a healthy eating plan that has been shown to reduce high blood pressure (hypertension). Additional health benefits may include reducing the risk of type 2 diabetes mellitus, heart disease, and stroke. The DASH eating plan may also help with weight loss.  WHAT DO I NEED TO KNOW ABOUT THE DASH EATING PLAN?  For the DASH eating plan, you will follow these general guidelines:  · Choose foods with a percent daily value for sodium of less than 5% (as listed on the food label).  · Use salt-free seasonings or herbs instead of table salt or sea salt.  · Check with your health care provider or pharmacist before using salt substitutes.  · Eat lower-sodium products, often labeled as "lower sodium" or "no salt added."  · Eat fresh foods.  · Eat more vegetables, fruits, and low-fat dairy products.  · Choose whole grains. Look for the word "whole" as the first word in the ingredient list.  · Choose fish and skinless chicken or turkey more often than red meat. Limit fish, poultry, and meat to 6 oz (170 g) each day.  · Limit sweets, desserts, sugars, and sugary drinks.  · Choose heart-healthy fats.  · Limit cheese to 1 oz (28 g) per day.  · Eat more home-cooked food and less restaurant, buffet, and fast food.  · Limit fried foods.  · Cook foods using methods other than frying.  · Limit canned vegetables. If you do use them, rinse them well to decrease the sodium.  · When eating at a restaurant, ask that your food be prepared with less salt, or no salt if possible.  WHAT FOODS CAN I EAT?  Seek help from a dietitian for individual calorie needs.  Grains  Whole grain or whole wheat bread. Brown rice. Whole grain or whole wheat pasta. Quinoa, bulgur, and whole grain cereals. Low-sodium cereals. Corn or whole wheat flour tortillas. Whole grain cornbread. Whole grain crackers. Low-sodium crackers.  Vegetables  Fresh or frozen vegetables  (raw, steamed, roasted, or grilled). Low-sodium or reduced-sodium tomato and vegetable juices. Low-sodium or reduced-sodium tomato sauce and paste. Low-sodium or reduced-sodium canned vegetables.   Fruits  All fresh, canned (in natural juice), or frozen fruits.  Meat and Other Protein Products  Ground beef (85% or leaner), grass-fed beef, or beef trimmed of fat. Skinless chicken or turkey. Ground chicken or turkey. Pork trimmed of fat. All fish and seafood. Eggs. Dried beans, peas, or lentils. Unsalted nuts and seeds. Unsalted canned beans.  Dairy  Low-fat dairy products, such as skim or 1% milk, 2% or reduced-fat cheeses, low-fat ricotta or cottage cheese, or plain low-fat yogurt. Low-sodium or reduced-sodium cheeses.  Fats and Oils  Tub margarines without trans fats. Light or reduced-fat mayonnaise and salad dressings (reduced sodium). Avocado. Safflower, olive, or canola oils. Natural peanut or almond butter.  Other  Unsalted popcorn and pretzels.  The items listed above may not be a complete list of recommended foods or beverages. Contact your dietitian for more options.  WHAT FOODS ARE NOT RECOMMENDED?  Grains  White bread. White pasta. White rice. Refined cornbread. Bagels and croissants. Crackers that contain trans fat.  Vegetables  Creamed or fried vegetables. Vegetables in a cheese sauce. Regular canned vegetables. Regular canned tomato sauce and paste. Regular tomato and vegetable juices.  Fruits  Dried fruits. Canned fruit in light or heavy syrup. Fruit juice.  Meat and Other Protein   Products  Fatty cuts of meat. Ribs, chicken wings, bacon, sausage, bologna, salami, chitterlings, fatback, hot dogs, bratwurst, and packaged luncheon meats. Salted nuts and seeds. Canned beans with salt.  Dairy  Whole or 2% milk, cream, half-and-half, and cream cheese. Whole-fat or sweetened yogurt. Full-fat cheeses or blue cheese. Nondairy creamers and whipped toppings. Processed cheese, cheese spreads, or cheese  curds.  Condiments  Onion and garlic salt, seasoned salt, table salt, and sea salt. Canned and packaged gravies. Worcestershire sauce. Tartar sauce. Barbecue sauce. Teriyaki sauce. Soy sauce, including reduced sodium. Steak sauce. Fish sauce. Oyster sauce. Cocktail sauce. Horseradish. Ketchup and mustard. Meat flavorings and tenderizers. Bouillon cubes. Hot sauce. Tabasco sauce. Marinades. Taco seasonings. Relishes.  Fats and Oils  Butter, stick margarine, lard, shortening, ghee, and bacon fat. Coconut, palm kernel, or palm oils. Regular salad dressings.  Other  Pickles and olives. Salted popcorn and pretzels.  The items listed above may not be a complete list of foods and beverages to avoid. Contact your dietitian for more information.  WHERE CAN I FIND MORE INFORMATION?  National Heart, Lung, and [employer] Institute: www.nhlbi.nih.gov/health/health-topics/topics/dash/     This information is not intended to replace advice given to you by your health care provider. Make sure you discuss any questions you have with your health care provider.     Document Released: 03/31/2011 Document Revised: 05/02/2014 Document Reviewed: 02/13/2013  Elsevier Interactive Patient Education ©2016 [employer] Inc.

## 2015-04-14 NOTE — Assessment & Plan Note (Signed)
Getting documents notarized soon

## 2015-04-14 NOTE — Assessment & Plan Note (Signed)
Doing okay on atorvastatin

## 2015-04-14 NOTE — Progress Notes (Signed)
Subjective:    Patient ID: Evan Giles, male    DOB: Aug 09, 1940, 74 y.o.   MRN: [id number]  HPI Here for Medicare wellness and follow up of chronic medical conditions Reviewed form and advanced directives Reviewed other doctors No tobacco or alcohol Tries to exercise regularly--- was busy with work in past 2 months Vision fine Hearing aides---they help No falls No depression or anhedonia Independent with instrumental ADLs No apparent cognitive problems  No new concerns Still works as IT trainer  No chest pain No SOB or change in exercise tolerance No dizziness or syncope No edema No palpitations  On statin No myalgias or GI problems  Current Outpatient Prescriptions on File Prior to Visit  Medication Sig Dispense Refill  . aspirin 81 MG tablet Take 81 mg by mouth daily.    Marland Kitchen atorvastatin (LIPITOR) 20 MG tablet Take 1 tablet (20 mg total) by mouth daily. 90 tablet 3  . CRESTOR 5 MG tablet TAKE ONE TABLET BY MOUTH EVERY DAY 90 tablet 3  . losartan-hydrochlorothiazide (HYZAAR) 100-12.5 MG per tablet TAKE ONE TABLET BY MOUTH EVERY DAY 90 tablet 3   No current facility-administered medications on file prior to visit.    Allergies  Allergen Reactions  . Hydrocodone Itching    Past Medical History  Diagnosis Date  . Hyperlipidemia   . Hypertension   . Osteoarthrosis of knee     Past Surgical History  Procedure Laterality Date  . Meniscus debridement  2/12    Dr Roberts Gaudy  . Joint replacement  3/13    Left knee---Dr Hooten  . Retinal laser procedure  4/12    Dr Darrick Grinder  . Cataract extraction w/ intraocular lens implant  8/12  . Tonsillectomy and adenoidectomy  1966    Family History  Problem Relation Age of Onset  . Heart disease Father   . Stroke Father     not certain about this  . Cancer Sister     unknown  . Diabetes Maternal Grandmother     Social History   Social History  . Marital Status: Married    Spouse Name: N/A  .  Number of Children: 2  . 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 Smoking status: Never Smoker   . Smokeless tobacco: Never Used  . Alcohol Use: No  . Drug Use: No  . Sexual Activity: Not on file   Other Topics Concern  . Not on file   Social History Narrative   I son and 1 daughter   Has living will   Requests wife as health care POA--plans to formalize (just needs notary)   Would accept resuscitation   Probably would accept feeding tube--at least for a limited time   Review of Systems Sleeps fair Appetite is "too good" Weight is stable Wears seat belt Teeth are fine--regular with dentist Bowels are fine Voids okay. Stream is okay. Stable nocturia x 1-2 Some left shoulder pain--known bone spur. No meds Has appt tomorrow with derm--needs cream and UV Rx    Objective:   Physical Exam  Constitutional: He is oriented to person, place, and time. He appears well-developed and well-nourished. No distress.  HENT:  Mouth/Throat: Oropharynx is clear and moist. No oropharyngeal exudate.  Neck: Normal range of motion. Neck supple. No thyromegaly present.  Cardiovascular: Normal rate, regular rhythm, normal heart sounds and intact distal pulses.  Exam reveals no gallop.   No murmur heard. Pulmonary/Chest: Effort normal and breath sounds normal. No respiratory distress. He has no wheezes. He has no rales.  Abdominal: Soft. There is no tenderness.  Musculoskeletal: He exhibits no edema or tenderness.  Lymphadenopathy:    He has no cervical adenopathy.  Neurological: He is alert and oriented to person, place, and time.  President-- "Obama, Bush, CLinton" [phone] [id number] Recall 3/3  Skin: No erythema.  Psychiatric: He has a normal mood and affect. His behavior is normal.          Assessment & Plan:

## 2015-04-15 DIAGNOSIS — C44629 Squamous cell carcinoma of skin of left upper limb, including shoulder: Secondary | ICD-10-CM | POA: Diagnosis not present

## 2015-04-15 DIAGNOSIS — C44622 Squamous cell carcinoma of skin of right upper limb, including shoulder: Secondary | ICD-10-CM | POA: Diagnosis not present

## 2015-04-21 ENCOUNTER — Other Ambulatory Visit: Payer: Self-pay | Admitting: Internal Medicine

## 2015-05-06 DIAGNOSIS — M9901 Segmental and somatic dysfunction of cervical region: Secondary | ICD-10-CM | POA: Diagnosis not present

## 2015-05-06 DIAGNOSIS — M5412 Radiculopathy, cervical region: Secondary | ICD-10-CM | POA: Diagnosis not present

## 2015-05-06 DIAGNOSIS — M5136 Other intervertebral disc degeneration, lumbar region: Secondary | ICD-10-CM | POA: Diagnosis not present

## 2015-05-06 DIAGNOSIS — M9903 Segmental and somatic dysfunction of lumbar region: Secondary | ICD-10-CM | POA: Diagnosis not present

## 2015-05-19 DIAGNOSIS — L2089 Other atopic dermatitis: Secondary | ICD-10-CM | POA: Diagnosis not present

## 2015-05-19 DIAGNOSIS — L821 Other seborrheic keratosis: Secondary | ICD-10-CM | POA: Diagnosis not present

## 2015-05-19 DIAGNOSIS — C44622 Squamous cell carcinoma of skin of right upper limb, including shoulder: Secondary | ICD-10-CM | POA: Diagnosis not present

## 2015-05-19 DIAGNOSIS — C44629 Squamous cell carcinoma of skin of left upper limb, including shoulder: Secondary | ICD-10-CM | POA: Diagnosis not present

## 2015-05-26 DIAGNOSIS — C44622 Squamous cell carcinoma of skin of right upper limb, including shoulder: Secondary | ICD-10-CM | POA: Diagnosis not present

## 2015-05-26 DIAGNOSIS — C44629 Squamous cell carcinoma of skin of left upper limb, including shoulder: Secondary | ICD-10-CM | POA: Diagnosis not present

## 2015-06-09 DIAGNOSIS — M9903 Segmental and somatic dysfunction of lumbar region: Secondary | ICD-10-CM | POA: Diagnosis not present

## 2015-06-09 DIAGNOSIS — M5136 Other intervertebral disc degeneration, lumbar region: Secondary | ICD-10-CM | POA: Diagnosis not present

## 2015-06-09 DIAGNOSIS — M9901 Segmental and somatic dysfunction of cervical region: Secondary | ICD-10-CM | POA: Diagnosis not present

## 2015-06-09 DIAGNOSIS — M5412 Radiculopathy, cervical region: Secondary | ICD-10-CM | POA: Diagnosis not present

## 2015-07-02 DIAGNOSIS — M5136 Other intervertebral disc degeneration, lumbar region: Secondary | ICD-10-CM | POA: Diagnosis not present

## 2015-07-02 DIAGNOSIS — M9903 Segmental and somatic dysfunction of lumbar region: Secondary | ICD-10-CM | POA: Diagnosis not present

## 2015-07-02 DIAGNOSIS — M5412 Radiculopathy, cervical region: Secondary | ICD-10-CM | POA: Diagnosis not present

## 2015-07-02 DIAGNOSIS — M9901 Segmental and somatic dysfunction of cervical region: Secondary | ICD-10-CM | POA: Diagnosis not present

## 2015-07-27 ENCOUNTER — Other Ambulatory Visit: Payer: Self-pay | Admitting: Internal Medicine

## 2015-07-29 DIAGNOSIS — M5412 Radiculopathy, cervical region: Secondary | ICD-10-CM | POA: Diagnosis not present

## 2015-07-29 DIAGNOSIS — M9901 Segmental and somatic dysfunction of cervical region: Secondary | ICD-10-CM | POA: Diagnosis not present

## 2015-07-29 DIAGNOSIS — M9903 Segmental and somatic dysfunction of lumbar region: Secondary | ICD-10-CM | POA: Diagnosis not present

## 2015-07-29 DIAGNOSIS — M5136 Other intervertebral disc degeneration, lumbar region: Secondary | ICD-10-CM | POA: Diagnosis not present

## 2015-08-06 DIAGNOSIS — G8929 Other chronic pain: Secondary | ICD-10-CM | POA: Diagnosis not present

## 2015-08-06 DIAGNOSIS — M25561 Pain in right knee: Secondary | ICD-10-CM | POA: Diagnosis not present

## 2015-08-06 DIAGNOSIS — M2391 Unspecified internal derangement of right knee: Secondary | ICD-10-CM | POA: Diagnosis not present

## 2015-08-06 DIAGNOSIS — Z96652 Presence of left artificial knee joint: Secondary | ICD-10-CM | POA: Diagnosis not present

## 2015-08-10 ENCOUNTER — Other Ambulatory Visit: Payer: Self-pay | Admitting: Orthopedic Surgery

## 2015-08-10 DIAGNOSIS — G8929 Other chronic pain: Secondary | ICD-10-CM

## 2015-08-10 DIAGNOSIS — M25561 Pain in right knee: Principal | ICD-10-CM

## 2015-08-11 ENCOUNTER — Ambulatory Visit
Admission: RE | Admit: 2015-08-11 | Discharge: 2015-08-11 | Disposition: A | Discharge status: Home / Self Care | Payer: Medicare Other | Source: Ambulatory Visit | Attending: Orthopedic Surgery | Admitting: Orthopedic Surgery

## 2015-08-11 DIAGNOSIS — M659 Synovitis and tenosynovitis, unspecified: Secondary | ICD-10-CM | POA: Insufficient documentation

## 2015-08-11 DIAGNOSIS — M1711 Unilateral primary osteoarthritis, right knee: Secondary | ICD-10-CM | POA: Diagnosis not present

## 2015-08-11 DIAGNOSIS — G8929 Other chronic pain: Secondary | ICD-10-CM | POA: Diagnosis not present

## 2015-08-11 DIAGNOSIS — S83281A Other tear of lateral meniscus, current injury, right knee, initial encounter: Secondary | ICD-10-CM | POA: Insufficient documentation

## 2015-08-11 DIAGNOSIS — M25561 Pain in right knee: Secondary | ICD-10-CM | POA: Insufficient documentation

## 2015-08-11 DIAGNOSIS — M25461 Effusion, right knee: Secondary | ICD-10-CM | POA: Diagnosis not present

## 2015-08-17 DIAGNOSIS — C44622 Squamous cell carcinoma of skin of right upper limb, including shoulder: Secondary | ICD-10-CM | POA: Diagnosis not present

## 2015-08-17 DIAGNOSIS — Z872 Personal history of diseases of the skin and subcutaneous tissue: Secondary | ICD-10-CM | POA: Diagnosis not present

## 2015-08-17 DIAGNOSIS — Z1283 Encounter for screening for malignant neoplasm of skin: Secondary | ICD-10-CM | POA: Diagnosis not present

## 2015-08-17 DIAGNOSIS — C44629 Squamous cell carcinoma of skin of left upper limb, including shoulder: Secondary | ICD-10-CM | POA: Diagnosis not present

## 2015-08-17 DIAGNOSIS — D485 Neoplasm of uncertain behavior of skin: Secondary | ICD-10-CM | POA: Diagnosis not present

## 2015-08-17 DIAGNOSIS — Z08 Encounter for follow-up examination after completed treatment for malignant neoplasm: Secondary | ICD-10-CM | POA: Diagnosis not present

## 2015-08-18 DIAGNOSIS — M2391 Unspecified internal derangement of right knee: Secondary | ICD-10-CM | POA: Diagnosis not present

## 2015-08-24 HISTORY — PX: OTHER SURGICAL HISTORY: SHX169

## 2015-08-26 DIAGNOSIS — M9901 Segmental and somatic dysfunction of cervical region: Secondary | ICD-10-CM | POA: Diagnosis not present

## 2015-08-26 DIAGNOSIS — M5412 Radiculopathy, cervical region: Secondary | ICD-10-CM | POA: Diagnosis not present

## 2015-08-26 DIAGNOSIS — M5136 Other intervertebral disc degeneration, lumbar region: Secondary | ICD-10-CM | POA: Diagnosis not present

## 2015-08-26 DIAGNOSIS — M9903 Segmental and somatic dysfunction of lumbar region: Secondary | ICD-10-CM | POA: Diagnosis not present

## 2015-08-31 ENCOUNTER — Encounter
Admission: RE | Admit: 2015-08-31 | Discharge: 2015-08-31 | Disposition: A | Discharge status: Home / Self Care | Payer: Medicare Other | Source: Ambulatory Visit | Attending: Orthopedic Surgery | Admitting: Orthopedic Surgery

## 2015-08-31 DIAGNOSIS — M9901 Segmental and somatic dysfunction of cervical region: Secondary | ICD-10-CM | POA: Diagnosis not present

## 2015-08-31 DIAGNOSIS — M9903 Segmental and somatic dysfunction of lumbar region: Secondary | ICD-10-CM | POA: Diagnosis not present

## 2015-08-31 DIAGNOSIS — M5136 Other intervertebral disc degeneration, lumbar region: Secondary | ICD-10-CM | POA: Diagnosis not present

## 2015-08-31 DIAGNOSIS — M5412 Radiculopathy, cervical region: Secondary | ICD-10-CM | POA: Diagnosis not present

## 2015-08-31 DIAGNOSIS — I10 Essential (primary) hypertension: Secondary | ICD-10-CM | POA: Diagnosis not present

## 2015-08-31 DIAGNOSIS — Z01812 Encounter for preprocedural laboratory examination: Secondary | ICD-10-CM | POA: Insufficient documentation

## 2015-08-31 HISTORY — DX: Peptic ulcer, site unspecified, unspecified as acute or chronic, without hemorrhage or perforation: K27.9

## 2015-08-31 LAB — POTASSIUM: Potassium: 4 mmol/L (ref 3.5–5.1)

## 2015-08-31 NOTE — Patient Instructions (Signed)
  Your procedure is scheduled on: 09-07-15 [facility]) Report to [facility] To find out your arrival time please call [phone] between 1PM - 3PM on 09-04-15 (FRIDAY)  Remember: Instructions that are not followed completely may result in serious medical risk, up to and including death, or upon the discretion of your surgeon and anesthesiologist your surgery may need to be rescheduled.    _X___ 1. Do not eat food or drink liquids after midnight. No gum chewing or hard candies.     _X___ 2. No Alcohol for 24 hours before or after surgery.   ____ 3. Bring all medications with you on the day of surgery if instructed.    _X___ 4. Notify your doctor if there is any change in your medical condition     (cold, fever, infections).     Do not wear jewelry, make-up, hairpins, clips or nail polish.  Do not wear lotions, powders, or perfumes. You may wear deodorant.  Do not shave 48 hours prior to surgery. Men may shave face and neck.  Do not bring valuables to the hospital.    [facility] is not responsible for any belongings or valuables.               Contacts, dentures or bridgework may not be worn into surgery.  Leave your suitcase in the car. After surgery it may be brought to your room.  For patients admitted to the hospital, discharge time is determined by your  treatment team.   Patients discharged the day of surgery will not be allowed to drive home.   Please read over the following fact sheets that you were given:     _X___ Take these medicines the morning of surgery with A SIP OF WATER:    1. CRESTOR (ROSUVASTATIN)  2. MAGNESIUM  3.   4.  5.  6.  ____ Fleet Enema (as directed)   _X___ Use CHG Soap as directed  ____ Use inhalers on the day of surgery  ____ Stop metformin 2 days prior to surgery    ____ Take 1/2 of usual insulin dose the night before surgery and none on the morning of surgery.   _X___ Stop Coumadin/Plavix/aspirin-STOP ASPIRIN  AS DIRECTED(10 DAYS PRIOR TO SURGERY)  _X___ Stop Anti-inflammatories-NO NSAIDS OR ASPIRIN PRODUCTS-TYLENOL OK TO TAKE   ____ Stop supplements until after surgery.    ____ Bring C-Pap to the hospital.

## 2015-09-07 ENCOUNTER — Encounter
Admission: RE | Disposition: A | Discharge status: Home / Self Care | Payer: Self-pay | Source: Ambulatory Visit | Attending: Orthopedic Surgery

## 2015-09-07 ENCOUNTER — Ambulatory Visit: Payer: Medicare Other | Admitting: Anesthesiology

## 2015-09-07 ENCOUNTER — Encounter: Payer: Self-pay | Admitting: *Deleted

## 2015-09-07 ENCOUNTER — Ambulatory Visit
Admission: RE | Admit: 2015-09-07 | Discharge: 2015-09-07 | Disposition: A | Discharge status: Home / Self Care | Payer: Medicare Other | Source: Ambulatory Visit | Attending: Orthopedic Surgery | Admitting: Orthopedic Surgery

## 2015-09-07 DIAGNOSIS — Z8249 Family history of ischemic heart disease and other diseases of the circulatory system: Secondary | ICD-10-CM | POA: Insufficient documentation

## 2015-09-07 DIAGNOSIS — Z96652 Presence of left artificial knee joint: Secondary | ICD-10-CM | POA: Diagnosis not present

## 2015-09-07 DIAGNOSIS — Z79899 Other long term (current) drug therapy: Secondary | ICD-10-CM | POA: Insufficient documentation

## 2015-09-07 DIAGNOSIS — M2391 Unspecified internal derangement of right knee: Secondary | ICD-10-CM | POA: Diagnosis present

## 2015-09-07 DIAGNOSIS — Z8619 Personal history of other infectious and parasitic diseases: Secondary | ICD-10-CM | POA: Diagnosis not present

## 2015-09-07 DIAGNOSIS — E785 Hyperlipidemia, unspecified: Secondary | ICD-10-CM | POA: Diagnosis not present

## 2015-09-07 DIAGNOSIS — Z7982 Long term (current) use of aspirin: Secondary | ICD-10-CM | POA: Diagnosis not present

## 2015-09-07 DIAGNOSIS — X58XXXA Exposure to other specified factors, initial encounter: Secondary | ICD-10-CM | POA: Diagnosis not present

## 2015-09-07 DIAGNOSIS — Z885 Allergy status to narcotic agent status: Secondary | ICD-10-CM | POA: Insufficient documentation

## 2015-09-07 DIAGNOSIS — S83281A Other tear of lateral meniscus, current injury, right knee, initial encounter: Secondary | ICD-10-CM | POA: Insufficient documentation

## 2015-09-07 DIAGNOSIS — M94261 Chondromalacia, right knee: Secondary | ICD-10-CM | POA: Diagnosis not present

## 2015-09-07 DIAGNOSIS — S83241A Other tear of medial meniscus, current injury, right knee, initial encounter: Secondary | ICD-10-CM | POA: Insufficient documentation

## 2015-09-07 DIAGNOSIS — Z9889 Other specified postprocedural states: Secondary | ICD-10-CM | POA: Diagnosis not present

## 2015-09-07 DIAGNOSIS — M23211 Derangement of anterior horn of medial meniscus due to old tear or injury, right knee: Secondary | ICD-10-CM | POA: Diagnosis not present

## 2015-09-07 DIAGNOSIS — I10 Essential (primary) hypertension: Secondary | ICD-10-CM | POA: Diagnosis not present

## 2015-09-07 DIAGNOSIS — M2241 Chondromalacia patellae, right knee: Secondary | ICD-10-CM | POA: Diagnosis not present

## 2015-09-07 DIAGNOSIS — M23241 Derangement of anterior horn of lateral meniscus due to old tear or injury, right knee: Secondary | ICD-10-CM | POA: Diagnosis not present

## 2015-09-07 HISTORY — PX: KNEE ARTHROSCOPY: SHX127

## 2015-09-07 SURGERY — ARTHROSCOPY, KNEE
Anesthesia: General | Site: Knee | Laterality: Right | Wound class: Clean

## 2015-09-07 MED ORDER — FENTANYL CITRATE (PF) 100 MCG/2ML IJ SOLN
INTRAMUSCULAR | Status: DC | PRN
  Administered 2015-09-07: 50 ug via INTRAVENOUS
  Administered 2015-09-07: 100 ug via INTRAVENOUS

## 2015-09-07 MED ORDER — LIDOCAINE HCL (CARDIAC) 20 MG/ML IV SOLN
INTRAVENOUS | Status: DC | PRN
  Administered 2015-09-07: 100 mg via INTRAVENOUS

## 2015-09-07 MED ORDER — BUPIVACAINE-EPINEPHRINE (PF) 0.25% -1:200000 IJ SOLN
INTRAMUSCULAR | Status: DC | PRN
  Administered 2015-09-07: 25 mL via PERINEURAL

## 2015-09-07 MED ORDER — CHLORHEXIDINE GLUCONATE 4 % EX LIQD: 60.0000 mL | Freq: Once | CUTANEOUS | Status: DC

## 2015-09-07 MED ORDER — DEXTROSE 5 % IV SOLN
3.0000 g | INTRAVENOUS | Status: AC
  Administered 2015-09-07: 3 g via INTRAVENOUS
  Filled 2015-09-07: qty 3000

## 2015-09-07 MED ORDER — FAMOTIDINE 20 MG PO TABS
ORAL_TABLET | ORAL | Status: AC
  Administered 2015-09-07: 20 mg via ORAL
  Filled 2015-09-07: qty 1

## 2015-09-07 MED ORDER — FENTANYL CITRATE (PF) 100 MCG/2ML IJ SOLN: 25.0000 ug | INTRAMUSCULAR | Status: DC | PRN

## 2015-09-07 MED ORDER — DEXAMETHASONE SODIUM PHOSPHATE 10 MG/ML IJ SOLN
INTRAMUSCULAR | Status: DC | PRN
  Administered 2015-09-07: 10 mg via INTRAVENOUS

## 2015-09-07 MED ORDER — ONDANSETRON HCL 4 MG/2ML IJ SOLN: 4.0000 mg | Freq: Once | INTRAMUSCULAR | Status: DC | PRN

## 2015-09-07 MED ORDER — PROPOFOL 10 MG/ML IV BOLUS
INTRAVENOUS | Status: DC | PRN
  Administered 2015-09-07: 150 mg via INTRAVENOUS

## 2015-09-07 MED ORDER — MIDAZOLAM HCL 2 MG/2ML IJ SOLN
INTRAMUSCULAR | Status: DC | PRN
  Administered 2015-09-07: 2 mg via INTRAVENOUS

## 2015-09-07 MED ORDER — ACETAMINOPHEN 10 MG/ML IV SOLN
INTRAVENOUS | Status: DC | PRN
  Administered 2015-09-07: 1000 mg via INTRAVENOUS

## 2015-09-07 MED ORDER — BUPIVACAINE-EPINEPHRINE (PF) 0.25% -1:200000 IJ SOLN
INTRAMUSCULAR | Status: AC
  Filled 2015-09-07: qty 30

## 2015-09-07 MED ORDER — ONDANSETRON HCL 4 MG/2ML IJ SOLN
INTRAMUSCULAR | Status: DC | PRN
  Administered 2015-09-07: 4 mg via INTRAVENOUS

## 2015-09-07 MED ORDER — BUPIVACAINE-EPINEPHRINE 0.25% -1:200000 IJ SOLN
INTRAMUSCULAR | Status: DC | PRN
  Administered 2015-09-07: 5 mL

## 2015-09-07 MED ORDER — FAMOTIDINE 20 MG PO TABS
20.0000 mg | ORAL_TABLET | Freq: Once | ORAL | Status: AC
  Administered 2015-09-07: 20 mg via ORAL

## 2015-09-07 MED ORDER — ACETAMINOPHEN 10 MG/ML IV SOLN
INTRAVENOUS | Status: AC
  Filled 2015-09-07: qty 100

## 2015-09-07 MED ORDER — OXYCODONE HCL 5 MG PO TABS: 5.0000 mg | ORAL_TABLET | ORAL | Status: DC | PRN

## 2015-09-07 MED ORDER — MORPHINE SULFATE (PF) 4 MG/ML IV SOLN
INTRAVENOUS | Status: DC | PRN
  Administered 2015-09-07: 4 mg via INTRAVENOUS

## 2015-09-07 MED ORDER — MORPHINE SULFATE (PF) 4 MG/ML IV SOLN
INTRAVENOUS | Status: AC
  Filled 2015-09-07: qty 1

## 2015-09-07 MED ORDER — LACTATED RINGERS IV SOLN
INTRAVENOUS | Status: DC
  Administered 2015-09-07 (×2): via INTRAVENOUS

## 2015-09-07 SURGICAL SUPPLY — 23 items
BLADE SHAVER 4.5 DBL SERAT CV (CUTTER) ×3 IMPLANT
BNDG ESMARK 6X12 TAN STRL LF (GAUZE/BANDAGES/DRESSINGS) ×3 IMPLANT
CUFF TOURN 24 STER (MISCELLANEOUS) IMPLANT
CUFF TOURN 30 STER DUAL PORT (MISCELLANEOUS) ×3 IMPLANT
DRSG DERMACEA 8X12 NADH (GAUZE/BANDAGES/DRESSINGS) ×3 IMPLANT
DURAPREP 26ML APPLICATOR (WOUND CARE) ×6 IMPLANT
GAUZE SPONGE 4X4 12PLY STRL (GAUZE/BANDAGES/DRESSINGS) ×3 IMPLANT
GLOVE BIOGEL M STRL SZ7.5 (GLOVE) ×3 IMPLANT
GLOVE INDICATOR 8.0 STRL GRN (GLOVE) ×3 IMPLANT
GOWN STRL REUS W/ TWL LRG LVL3 (GOWN DISPOSABLE) ×2 IMPLANT
GOWN STRL REUS W/TWL LRG LVL3 (GOWN DISPOSABLE) ×4
IV LACTATED RINGER IRRG 3000ML (IV SOLUTION) ×12
IV LR IRRIG 3000ML ARTHROMATIC (IV SOLUTION) ×6 IMPLANT
KIT RM TURNOVER STRD PROC AR (KITS) ×3 IMPLANT
MANIFOLD NEPTUNE II (INSTRUMENTS) ×3 IMPLANT
PACK ARTHROSCOPY KNEE (MISCELLANEOUS) ×3 IMPLANT
SET TUBE SUCT SHAVER OUTFL 24K (TUBING) ×3 IMPLANT
SET TUBE TIP INTRA-ARTICULAR (MISCELLANEOUS) ×3 IMPLANT
SUT ETHILON 3-0 FS-10 30 BLK (SUTURE) ×3
SUTURE EHLN 3-0 FS-10 30 BLK (SUTURE) ×1 IMPLANT
TUBING ARTHRO INFLOW-ONLY STRL (TUBING) ×3 IMPLANT
WAND HAND CNTRL MULTIVAC 50 (MISCELLANEOUS) ×3 IMPLANT
WRAP KNEE W/COLD PACKS 25.5X14 (SOFTGOODS) ×3 IMPLANT

## 2015-09-07 NOTE — H&P (Signed)
The patient has been re-examined, and the chart reviewed, and there have been no interval changes to the documented history and physical.    The risks, benefits, and alternatives have been discussed at length. The patient expressed understanding of the risks benefits and agreed with plans for surgical intervention.  James P. Hooten, Jr. M.D.    

## 2015-09-07 NOTE — Anesthesia Preprocedure Evaluation (Addendum)
Anesthesia Evaluation  Patient identified by MRN, date of birth, ID band Patient awake    Reviewed: Allergy & Precautions, H&P , NPO status , Patient's Chart, lab work & pertinent test results, reviewed documented beta blocker date and time   History of Anesthesia Complications Negative for: history of anesthetic complications  Airway Mallampati: III  TM Distance: >3 FB Neck ROM: full    Dental no notable dental hx. (+) Implants, Caps, Teeth Intact Patient reports that top right medial incisor is temp cap and is somewhat loose:   Pulmonary neg pulmonary ROS,    Pulmonary exam normal breath sounds clear to auscultation       Cardiovascular Exercise Tolerance: Good hypertension, On Medications (-) angina(-) CAD, (-) Past MI, (-) Cardiac Stents and (-) CABG Normal cardiovascular exam(-) dysrhythmias (-) Valvular Problems/Murmurs Rhythm:regular Rate:Normal     Neuro/Psych negative neurological ROS  negative psych ROS   GI/Hepatic Neg liver ROS, PUD,   Endo/Other  negative endocrine ROS  Renal/GU negative Renal ROS  negative genitourinary   Musculoskeletal  (+) Arthritis ,   Abdominal   Peds  Hematology negative hematology ROS (+)   Anesthesia Other Findings Past Medical History:   Hyperlipidemia                                               Hypertension                                                 Osteoarthrosis of knee                                       Peptic ulcer                                                   Comment:years ago   Reproductive/Obstetrics negative OB ROS                           Anesthesia Physical Anesthesia Plan  ASA: II  Anesthesia Plan: General   Post-op Pain Management:    Induction:   Airway Management Planned:   Additional Equipment:   Intra-op Plan:   Post-operative Plan:   Informed Consent: I have reviewed the patients History and  Physical, chart, labs and discussed the procedure including the risks, benefits and alternatives for the proposed anesthesia with the patient or authorized representative who has indicated his/her understanding and acceptance.   Dental Advisory Given  Plan Discussed with: Anesthesiologist, CRNA and Surgeon  Anesthesia Plan Comments:         Anesthesia Quick Evaluation

## 2015-09-07 NOTE — Anesthesia Postprocedure Evaluation (Signed)
Anesthesia Post Note  Patient: Evan Giles  Procedure(s) Performed: Procedure(s) (LRB): ARTHROSCOPY KNEE, PARTIAL MEDIAL AND LATERAL MENISECTOMY, chondroplasty of patella femoral. (Right)  Patient location during evaluation: PACU Anesthesia Type: General Level of consciousness: awake and alert Pain management: pain level controlled Vital Signs Assessment: post-procedure vital signs reviewed and stable Respiratory status: spontaneous breathing, nonlabored ventilation, respiratory function stable and patient connected to nasal cannula oxygen Cardiovascular status: blood pressure returned to baseline and stable Postop Assessment: no signs of nausea or vomiting Anesthetic complications: no    Last Vitals:  Filed Vitals:   09/07/15 1855 09/07/15 1859  BP: 154/88 146/76  Pulse: 71 66  Temp:  36.6 C  Resp: 16 19    Last Pain:  Filed Vitals:   09/07/15 1900  PainSc: 0-No pain                 Precious Haws Piscitello

## 2015-09-07 NOTE — Brief Op Note (Signed)
09/07/2015  6:36 PM  PATIENT:  Evan Giles  75 y.o. male  PRE-OPERATIVE DIAGNOSIS:  INTERNAL DERANGEMENT RIGHT KNEE  POST-OPERATIVE DIAGNOSIS:  Tear of posterior horn of medial meniscus and tear of anterior horn lateral meniscus.  Chondromalacia  PROCEDURE:  Procedure(s): ARTHROSCOPY KNEE, PARTIAL MEDIAL AND LATERAL MENISECTOMY, chondroplasty of patella femoral. (Right)  SURGEON:  Surgeon(s) and Role:    * Dereck Leep, MD - Primary  ASSISTANTS: none   ANESTHESIA:   general  EBL:  Total I/O In: 1000 [I.V.:1000] Out: -   BLOOD ADMINISTERED:none  DRAINS: none   LOCAL MEDICATIONS USED:  MARCAINE     SPECIMEN:  No Specimen  DISPOSITION OF SPECIMEN:  N/A  COUNTS:  YES  TOURNIQUET:   not used  DICTATION: .Sales executive  PLAN OF CARE: Discharge to home after PACU  PATIENT DISPOSITION:  PACU - hemodynamically stable.   Delay start of Pharmacological VTE agent (>24hrs) due to surgical blood loss or risk of bleeding: not applicable

## 2015-09-07 NOTE — Op Note (Signed)
OPERATIVE NOTE  DATE OF SURGERY:  09/07/2015  PATIENT NAME:  Evan Giles   DOB: Jul 04, 1940  MRN: [id number]   PRE-OPERATIVE DIAGNOSIS:  Internal derangement of the right knee   POST-OPERATIVE DIAGNOSIS:   Tear of the posterior horn of the medial meniscus, right knee Tear of the anterior horn of the lateral meniscus, right knee Grade 2-3 chondromalacia, right knee  PROCEDURE:  Right knee arthroscopy, partial medial and lateral meniscectomies, and chondroplasty  SURGEON:  Marciano Sequin., M.D.   ASSISTANT: none  ANESTHESIA: general  ESTIMATED BLOOD LOSS: Minimal  FLUIDS REPLACED: 1000 mL of crystalloid  TOURNIQUET TIME: Not used   DRAINS: none  IMPLANTS UTILIZED: None  INDICATIONS FOR SURGERY: Evan Giles is a 75 y.o. year old male who has been seen for complaints of right knee pain. MRI demonstrated findings consistent with meniscal pathology. After discussion of the risks and benefits of surgical intervention, the patient expressed understanding of the risks benefits and agree with plans for right knee arthroscopy.   PROCEDURE IN DETAIL: The patient was brought into the operating room and, after adequate general anesthesia was achieved, a tourniquet was applied to the right thigh and the leg was placed in the leg holder. All bony prominences were well padded. The patient's right knee was cleaned and prepped with alcohol and Duraprep and draped in the usual sterile fashion. A "timeout" was performed as per usual protocol. The anticipated portal sites were injected with 0.25% Marcaine with epinephrine. An anterolateral incision was made and a cannula was inserted. A small effusion was evacuated and the knee was distended with fluid using the pump. The scope was advanced down the medial gutter into the medial compartment. Under visualization with the scope, an anteromedial portal was created and a hooked probe was inserted. The medial meniscus was visualized and  probed. There was a degenerative tear of the posterior horn of the medial meniscus. The tear was debrided using meniscal punches and a 4.5 mm incisor shaver. Final contouring was performed using an ArthroCare wand. The articular cartilage was visualized. Several small focal areas of grade 2-3 chondromalacia were noted primarily involving the medial femoral condyle. These areas were debrided using the ArthroCare wand.  The scope was then advanced into the intercondylar notch. The anterior cruciate ligament was visualized and probed and felt to be intact. The scope was removed from the lateral portal and reinserted via the anteromedial portal to better visualize the lateral compartment. The lateral meniscus was visualized and probed. There was a tear of the anterior horn of the lateral meniscus. The tear was debrided using the 4.5 mm incisor shaver and then contoured using the ArthroCare wand. The articular cartilage of the lateral compartment was visualized. Minimal chondral changes were noted. Finally, the scope was advanced so as to visualize the patellofemoral articulation. Good patellar tracking was appreciated. There was an area of grade 2-3 chondromalacia involving the intercondylar sulcus. This area was debrided and contoured using the ArthroCare wand.  The knee was irrigated with copius amounts of fluid and suctioned dry. The anterolateral portal was re-approximated with #3-0 nylon. A combination of 0.25% Marcaine with epinephrine and 4 mg of Morphine were injected via the scope. The scope was removed and the anteromedial portal was re-approximated with #3-0 nylon. A sterile dressing was applied followed by application of an ice wrap.  The patient tolerated the procedure well and was transported to the PACU in stable condition.  James P. Holley Bouche., M.D.

## 2015-09-07 NOTE — Transfer of Care (Signed)
Immediate Anesthesia Transfer of Care Note  Patient: Evan Giles  Procedure(s) Performed: Procedure(s): ARTHROSCOPY KNEE, PARTIAL MEDIAL AND LATERAL MENISECTOMY, chondroplasty of patella femoral. (Right)  Patient Location: PACU  Anesthesia Type:General  Level of Consciousness: sedated  Airway & Oxygen Therapy: Patient Spontanous Breathing and Patient connected to face mask oxygen  Post-op Assessment: Post -op Vital signs reviewed and stable  Post vital signs: stable  Last Vitals:  Filed Vitals:   09/07/15 1425 09/07/15 1828  BP: 140/61 152/81  Pulse: 78 77  Temp: 36.8 C 36.7 C  Resp: 18 13    Last Pain:  Filed Vitals:   09/07/15 1830  PainSc: Asleep         Complications: No apparent anesthesia complications

## 2015-09-07 NOTE — Discharge Instructions (Signed)
°  Instructions after Knee Arthroscopy    James P. Holley Bouche., M.D.     Dept. of [facility]  [place], [place]  [address]   Phone: [phone]   Fax: [phone]   DIET:  Drink plenty of non-alcoholic fluids & begin a light diet.  Resume your normal diet the day after surgery.  ACTIVITY:   You may use crutches or a walker with weight-bearing as tolerated, unless instructed otherwise.  You may wean yourself off of the walker or crutches as tolerated.   Begin doing gentle exercises. Exercising will reduce the pain and swelling, increase motion, and prevent muscle weakness.    Avoid strenuous activities or athletics for a minimum of 4-6 weeks after arthroscopic surgery.  Do not drive or operate any equipment until instructed.  WOUND CARE:   Place one to two pillows under the knee the first day or two when sitting or lying.   Continue to use the ice packs periodically to reduce pain and swelling.  The small incisions in your knee are closed with nylon stitches. The stitches will be removed in the office.  The bulky dressing may be removed on the second day after surgery. DO NOT TOUCH THE STITCHES. Put a Band-Aid over each stitch. Do NOT use any ointments or creams on the incisions.   You may bathe or shower after the stitches are removed at the first office visit following surgery.  MEDICATIONS:  You may resume your regular medications.  Please take the pain medication as prescribed.  Do not take pain medication on an empty stomach.  Do not drive or drink alcoholic beverages when taking pain medications.  CALL THE OFFICE FOR:  Temperature above 101 degrees  Excessive bleeding or drainage on the dressing.  Excessive swelling, coldness, or paleness of the toes.  Persistent nausea and vomiting.  FOLLOW-UP:   You should have an appointment to return to the office in 7-10 days after surgery.    [place] [address]. [place]   1. Take your medication as prescribed.  Pain medication should be taken only as needed.  2. Keep the dressing clean, dry and intact.  3. Keep your foot elevated above the heart level for the first 48 hours.  4. Walking to the bathroom and brief periods of walking are acceptable, unless we have instructed you to be non-weight bearing.  5. Always wear your post-op shoe when walking.  Always use your crutches if you are to be non-weight bearing.  6. Do not take a shower. Baths are permissible as long as the foot is kept out of the water.   7. Every hour you are awake:  - Bend your knee 15 times. - Flex foot 15 times - Massage calf 15 times  8. Call [facility] [phone]) if any of the following problems occur: - You develop a temperature or fever. - The bandage becomes saturated with blood. - Medication does not stop your pain. - Injury of the foot occurs. - Any symptoms of infection including redness, odor, or red streaks running from wound. -

## 2015-09-08 ENCOUNTER — Encounter: Payer: Self-pay | Admitting: Orthopedic Surgery

## 2015-10-16 NOTE — Progress Notes (Signed)
Chief Complaint  Patient presents with  . feeling "Hot"  . Fatigue    HPI: Evan Giles 75 y.o.  Patient comes in today for [facility] Saturday clinic for  new problem evaluation.PCP Dr Silvio Pate .  Hot and  fatigue   Power surges  On going but worse in last month .  Yesterday felt extremetly bad and lethargic and no energy   no fever.  Here with wife  Gets tired about half day into work   [employer]  And goes home and gets i recliner    Knee surgery may 15    Pain med for knee   No sob   Stopped up cycodone hydrocodone  tranadol now   1-2 per day .  Snoring   No stopped breathing .  ROS: See pertinent positives and negatives per HPI. No cp sob  ? If minor cough  No weight loss appetite changes gi gu changes .   Past Medical History  Diagnosis Date  . Hyperlipidemia   . Hypertension   . Osteoarthrosis of knee   . Peptic ulcer     years ago    Family History  Problem Relation Age of Onset  . Heart disease Father   . Stroke Father     not certain about this  . Cancer Sister     unknown  . Diabetes Maternal Grandmother     Social History   Social History  . Marital Status: Married    Spouse Name: N/A  . Number of Children: 2  . 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 Smoking status: Never Smoker   . Smokeless tobacco: Never Used  . Alcohol Use: No  . Drug Use: No  . Sexual Activity: Not Asked   Other Topics Concern  . None   Social History Narrative   I son and 1 daughter   Has living will   Requests wife as health care POA--plans to formalize (just needs notary)   Would accept resuscitation   Probably would accept feeding tube--at least for a limited time    Outpatient Prescriptions Prior to Visit  Medication Sig Dispense Refill  . amoxicillin (AMOXIL) 250 MG capsule Take by mouth 3 (three) times daily. Prior to Dental Procedure    . aspirin 81 MG tablet Take  81 mg by mouth daily.    . Calcium Citrate-Vitamin D (CALCIUM + D PO) Take 1 tablet by mouth every morning.    Marland Kitchen losartan-hydrochlorothiazide (HYZAAR) 100-12.5 MG tablet TAKE ONE TABLET BY MOUTH EVERY DAY 90 tablet 2  . MAGNESIUM CHLORIDE PO Take 1 tablet by mouth every morning.    . rosuvastatin (CRESTOR) 5 MG tablet Take 5 mg by mouth every morning.    Marland Kitchen oxyCODONE (ROXICODONE) 5 MG immediate release tablet Take 1-2 tablets (5-10 mg total) by mouth every 4 (four) hours as needed for severe pain. 30 tablet 0   No facility-administered medications prior to visit.     EXAM:  BP 132/74 mmHg  Pulse 68  Temp(Src) 98 F (36.7 C) (Oral)  Resp 13  Wt 258 lb 4 oz (117.141 kg)  SpO2 97%  Body mass index is 35.02 kg/(m^2).  GENERAL: vitals reviewed and listed above, alert, oriented, appears well hydrated and in no acute distress HEENT: atraumatic, conjunctiva  clear, no obvious abnormalities on inspection of external nose and ears hearing  aids  ocass dry irritaative  Cough  OP : no lesion edema or exudate  NECK: no obvious masses on inspection palpation  LUNGS: clear to auscultation bilaterally, no wheezes, rales or rhonchi, good air movement CV: HRRR, no clubbing cyanosis or  peripheral edema nl cap refill  Abdomen:  Sof,t normal bowel sounds without hepatosplenomegaly, no guarding rebound or masses no CVA tenderness  Skin: normal capillary refill ,turgor , color: No acute rashes ,petechiae or small bruise  abd superficial  MS: moves all extremities without noticeable focal  Abnormality vv healed knee scar  PSYCH: pleasant and cooperative, no obvious depression or anxiety Lab Results  Component Value Date   WBC 8.2 04/14/2015   HGB 14.1 04/14/2015   HCT 43.0 04/14/2015   PLT 296.0 04/14/2015   GLUCOSE 102* 04/14/2015   CHOL 185 04/14/2015   TRIG 207.0* 04/14/2015   HDL 35.10* 04/14/2015   LDLDIRECT 122.0 04/14/2015   ALT 17 04/14/2015   AST 22 04/14/2015   NA 137 04/14/2015   K  4.0 08/31/2015   CL 102 04/14/2015   CREATININE 0.91 04/14/2015   BUN 21 04/14/2015   CO2 29 04/14/2015   TSH 0.42 04/04/2013   INR 0.9 06/20/2011   HGBA1C 6.5 04/14/2015   Wt Readings from Last 3 Encounters:  10/17/15 258 lb 4 oz (117.141 kg)  09/07/15 261 lb (118.389 kg)  08/31/15 260 lb (117.935 kg)   Pp bg is only 125  ASSESSMENT AND PLAN:  Discussed the following assessment and plan:  Other fatigue  Hot flashes  Fasting hyperglycemia - Plan: POCT Glucose (CBG) Uncertain cause   Advise  Further work up best done at   [employer] office .  Total visit 11mins > 50% spent counseling and coordinating care as indicated in above note and in instructions to patient .  -Patient advised to return or notify health care team  if symptoms worsen ,persist or new concerns arise.  Patient Instructions   Uncertain cause    Of  Your fatigue . Reassuring that  Exam is good  Today .  Take temperature to see if having fever100.3    And   Make appt with dr Silvio Pate for more evaluation .  Blood test  Thyroid tests ? If chest x ray .   More work up . Pain and meds can cause fatigue but not sure the only factor.   BP Readings from Last 3 Encounters:  10/17/15 132/74  09/07/15 149/84  08/31/15 142/77          Mariann Laster K. Panosh M.D.

## 2015-10-17 ENCOUNTER — Ambulatory Visit (INDEPENDENT_AMBULATORY_CARE_PROVIDER_SITE_OTHER): Payer: Medicare Other | Admitting: Internal Medicine

## 2015-10-17 ENCOUNTER — Encounter: Payer: Self-pay | Admitting: Internal Medicine

## 2015-10-17 VITALS — BP 132/74 | HR 68 | Temp 98.0°F | Resp 13 | Wt 258.2 lb

## 2015-10-17 DIAGNOSIS — R7301 Impaired fasting glucose: Secondary | ICD-10-CM | POA: Diagnosis not present

## 2015-10-17 DIAGNOSIS — R232 Flushing: Secondary | ICD-10-CM

## 2015-10-17 DIAGNOSIS — R5383 Other fatigue: Secondary | ICD-10-CM

## 2015-10-17 LAB — GLUCOSE, POCT (MANUAL RESULT ENTRY): POC Glucose: 125 mg/dl — AB (ref 70–99)

## 2015-10-17 NOTE — Progress Notes (Signed)
Pre visit review using our clinic review tool, if applicable. No additional management support is needed unless otherwise documented below in the visit note. 

## 2015-10-17 NOTE — Patient Instructions (Addendum)
Uncertain cause    Of  Your fatigue . Reassuring that  Exam is good  Today .  Take temperature to see if having fever100.3    And   Make appt with dr Silvio Pate for more evaluation .  Blood test  Thyroid tests ? If chest x ray .   More work up . Pain and meds can cause fatigue but not sure the only factor.   BP Readings from Last 3 Encounters:  10/17/15 132/74  09/07/15 149/84  08/31/15 142/77

## 2015-10-21 ENCOUNTER — Encounter: Payer: Self-pay | Admitting: Internal Medicine

## 2015-10-21 ENCOUNTER — Ambulatory Visit (INDEPENDENT_AMBULATORY_CARE_PROVIDER_SITE_OTHER): Payer: Medicare Other | Admitting: Internal Medicine

## 2015-10-21 VITALS — BP 118/80 | HR 67 | Temp 98.2°F | Wt 257.0 lb

## 2015-10-21 DIAGNOSIS — R5383 Other fatigue: Secondary | ICD-10-CM | POA: Insufficient documentation

## 2015-10-21 LAB — COMPREHENSIVE METABOLIC PANEL
ALBUMIN: 4.4 g/dL (ref 3.5–5.2)
ALK PHOS: 69 U/L (ref 39–117)
ALT: 12 U/L (ref 0–53)
AST: 17 U/L (ref 0–37)
BUN: 21 mg/dL (ref 6–23)
CO2: 29 mEq/L (ref 19–32)
CREATININE: 0.81 mg/dL (ref 0.40–1.50)
Calcium: 9.4 mg/dL (ref 8.4–10.5)
Chloride: 103 mEq/L (ref 96–112)
GFR: 99.2 mL/min (ref >60.00)
Glucose, Bld: 98 mg/dL (ref 70–99)
Potassium: 4.6 mEq/L (ref 3.5–5.1)
SODIUM: 138 meq/L (ref 135–145)
TOTAL PROTEIN: 7.2 g/dL (ref 6.0–8.3)
Total Bilirubin: 0.4 mg/dL (ref 0.2–1.2)

## 2015-10-21 LAB — CBC WITH DIFFERENTIAL/PLATELET
BASOS PCT: 0.5 % (ref 0.0–3.0)
Basophils Absolute: 0 10*3/uL (ref 0.0–0.1)
EOS ABS: 0.2 10*3/uL (ref 0.0–0.7)
Eosinophils Relative: 2.4 % (ref 0.0–5.0)
HCT: 39.5 % (ref 39.0–52.0)
HEMOGLOBIN: 13.1 g/dL (ref 13.0–17.0)
Lymphocytes Relative: 22.6 % (ref 12.0–46.0)
Lymphs Abs: 2 10*3/uL (ref 0.7–4.0)
MCHC: 33.1 g/dL (ref 30.0–36.0)
MCV: 85.5 fl (ref 78.0–100.0)
MONO ABS: 0.7 10*3/uL (ref 0.1–1.0)
Monocytes Relative: 8 % (ref 3.0–12.0)
Neutro Abs: 5.9 10*3/uL (ref 1.4–7.7)
Neutrophils Relative %: 66.5 % (ref 43.0–77.0)
PLATELETS: 309 10*3/uL (ref 150.0–400.0)
RBC: 4.62 Mil/uL (ref 4.22–5.81)
RDW: 14.1 % (ref 11.5–15.5)
WBC: 8.8 10*3/uL (ref 4.0–10.5)

## 2015-10-21 LAB — LIPID PANEL
CHOLESTEROL: 153 mg/dL (ref 0–200)
HDL: 35 mg/dL — ABNORMAL LOW (ref >39.00)
LDL CALC: 85 mg/dL (ref 0–99)
NONHDL: 118.47
Total CHOL/HDL Ratio: 4
Triglycerides: 167 mg/dL — ABNORMAL HIGH (ref 0.0–149.0)
VLDL: 33.4 mg/dL (ref 0.0–40.0)

## 2015-10-21 LAB — T4, FREE: Free T4: 0.96 ng/dL (ref 0.60–1.60)

## 2015-10-21 NOTE — Patient Instructions (Signed)
Please stay off the rosuvastatin for the next couple of weeks--to see if that helps how you feel.

## 2015-10-21 NOTE — Assessment & Plan Note (Signed)
Vague and goes back to January--but worse recently (seems to be related to poor outcome with arthroscopy) Exam benign (no nodes, HSM, etc) No worrisome history Will check labs Discussed trying to exercise Better sleep off the tramadol

## 2015-10-21 NOTE — Progress Notes (Signed)
Pre visit review using our clinic review tool, if applicable. No additional management support is needed unless otherwise documented below in the visit note. 

## 2015-10-21 NOTE — Progress Notes (Signed)
Subjective:    Patient ID: Evan Giles, male    DOB: 22-Aug-1940, 75 y.o.   MRN: [id number]  HPI Here for follow up of urgent visit for fatigue  Has noticed a problem since January Had been very busy with Medicare enrollment period--then should have had more time But felt lethargic and no energy Knee arthroscopy 5/17 on right---still having pain  Off and on with energy issues Seems worse in last few weeks--?related to surgery Still needs pain meds  Not really able to exercise due to knee Just starting over the past week--only upper body  No chest pain No SOB No dizziness or syncope  No depression or hopeless feelings Has started multivitamin  Current Outpatient Prescriptions on File Prior to Visit  Medication Sig Dispense Refill  . aspirin 81 MG tablet Take 81 mg by mouth daily.    . Calcium Citrate-Vitamin D (CALCIUM + D PO) Take 1 tablet by mouth every morning.    Marland Kitchen losartan-hydrochlorothiazide (HYZAAR) 100-12.5 MG tablet TAKE ONE TABLET BY MOUTH EVERY DAY 90 tablet 2  . MAGNESIUM CHLORIDE PO Take 1 tablet by mouth every morning.    Marland Kitchen amoxicillin (AMOXIL) 250 MG capsule Take by mouth 3 (three) times daily. Reported on 10/21/2015     No current facility-administered medications on file prior to visit.    Allergies  Allergen Reactions  . Hydrocodone Itching    Past Medical History  Diagnosis Date  . Hyperlipidemia   . Hypertension   . Osteoarthrosis of knee   . Peptic ulcer     years ago    Past Surgical History  Procedure Laterality Date  . Meniscus debridement  2/12    Dr Roberts Gaudy  . Joint replacement  3/13    Left knee---Dr Hooten  . Retinal laser procedure  4/12    Dr Darrick Grinder  . Cataract extraction w/ intraocular lens implant  8/12  . Tonsillectomy and adenoidectomy  1966  . Dental implant  08/2015  . Knee arthroscopy Right 09/07/2015    Procedure: ARTHROSCOPY KNEE, PARTIAL MEDIAL AND LATERAL MENISECTOMY, chondroplasty of patella  femoral.;  Surgeon: Dereck Leep, MD;  Location: [facility] ORS;  Service: Orthopedics;  Laterality: Right;    Family History  Problem Relation Age of Onset  . Heart disease Father   . Stroke Father     not certain about this  . Cancer Sister     unknown  . Diabetes Maternal Grandmother     Social History   Social History  . Marital Status: Married    Spouse Name: N/A  . Number of Children: 2  . 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 Smoking status: Never Smoker   . Smokeless tobacco: Never Used  . Alcohol Use: No  . Drug Use: No  . Sexual Activity: Not on file   Other Topics Concern  . Not on file   Social History Narrative   I son and 1 daughter   Has living will   Requests wife as health care POA--plans to formalize (just needs notary)   Would accept resuscitation   Probably would accept feeding tube--at least for a limited time   Review of Systems Appetite is fine Trying to slowly take off weight Not sleeping well since the surgery--- tramadol actually made it worse. Was sleeping okay before that    Objective:   Physical  Exam  Constitutional: He appears well-developed and well-nourished. No distress.  HENT:  Mouth/Throat: Oropharynx is clear and moist. No oropharyngeal exudate.  Neck: Normal range of motion. Neck supple. No thyromegaly present.  Cardiovascular: Normal rate, regular rhythm, normal heart sounds and intact distal pulses.  Exam reveals no gallop.   No murmur heard. Pulmonary/Chest: Effort normal and breath sounds normal. No respiratory distress. He has no wheezes. He has no rales.  Abdominal: Soft. There is no tenderness.  Musculoskeletal: He exhibits no edema or tenderness.  Lymphadenopathy:    He has no cervical adenopathy.  Psychiatric: He has a normal mood and affect. His behavior is normal.          Assessment & Plan:

## 2015-10-26 ENCOUNTER — Ambulatory Visit: Payer: Medicare Other | Admitting: Internal Medicine

## 2015-11-25 DIAGNOSIS — M5412 Radiculopathy, cervical region: Secondary | ICD-10-CM | POA: Diagnosis not present

## 2015-11-25 DIAGNOSIS — M9903 Segmental and somatic dysfunction of lumbar region: Secondary | ICD-10-CM | POA: Diagnosis not present

## 2015-11-25 DIAGNOSIS — M9901 Segmental and somatic dysfunction of cervical region: Secondary | ICD-10-CM | POA: Diagnosis not present

## 2015-11-25 DIAGNOSIS — M5136 Other intervertebral disc degeneration, lumbar region: Secondary | ICD-10-CM | POA: Diagnosis not present

## 2016-01-05 DIAGNOSIS — M5136 Other intervertebral disc degeneration, lumbar region: Secondary | ICD-10-CM | POA: Diagnosis not present

## 2016-01-05 DIAGNOSIS — M9903 Segmental and somatic dysfunction of lumbar region: Secondary | ICD-10-CM | POA: Diagnosis not present

## 2016-01-05 DIAGNOSIS — M5412 Radiculopathy, cervical region: Secondary | ICD-10-CM | POA: Diagnosis not present

## 2016-01-05 DIAGNOSIS — M9901 Segmental and somatic dysfunction of cervical region: Secondary | ICD-10-CM | POA: Diagnosis not present

## 2016-01-27 DIAGNOSIS — M5136 Other intervertebral disc degeneration, lumbar region: Secondary | ICD-10-CM | POA: Diagnosis not present

## 2016-01-27 DIAGNOSIS — M5412 Radiculopathy, cervical region: Secondary | ICD-10-CM | POA: Diagnosis not present

## 2016-01-27 DIAGNOSIS — M9903 Segmental and somatic dysfunction of lumbar region: Secondary | ICD-10-CM | POA: Diagnosis not present

## 2016-01-27 DIAGNOSIS — M9901 Segmental and somatic dysfunction of cervical region: Secondary | ICD-10-CM | POA: Diagnosis not present

## 2016-04-04 ENCOUNTER — Other Ambulatory Visit: Payer: Self-pay | Admitting: Internal Medicine

## 2016-04-04 DIAGNOSIS — Z1283 Encounter for screening for malignant neoplasm of skin: Secondary | ICD-10-CM | POA: Diagnosis not present

## 2016-04-04 DIAGNOSIS — Z85828 Personal history of other malignant neoplasm of skin: Secondary | ICD-10-CM | POA: Diagnosis not present

## 2016-04-04 DIAGNOSIS — Z08 Encounter for follow-up examination after completed treatment for malignant neoplasm: Secondary | ICD-10-CM | POA: Diagnosis not present

## 2016-04-04 DIAGNOSIS — D2272 Melanocytic nevi of left lower limb, including hip: Secondary | ICD-10-CM | POA: Diagnosis not present

## 2016-04-05 DIAGNOSIS — M9901 Segmental and somatic dysfunction of cervical region: Secondary | ICD-10-CM | POA: Diagnosis not present

## 2016-04-05 DIAGNOSIS — M5412 Radiculopathy, cervical region: Secondary | ICD-10-CM | POA: Diagnosis not present

## 2016-04-05 DIAGNOSIS — M9903 Segmental and somatic dysfunction of lumbar region: Secondary | ICD-10-CM | POA: Diagnosis not present

## 2016-04-05 DIAGNOSIS — M5136 Other intervertebral disc degeneration, lumbar region: Secondary | ICD-10-CM | POA: Diagnosis not present

## 2016-04-19 ENCOUNTER — Telehealth: Payer: Self-pay | Admitting: *Deleted

## 2016-04-19 ENCOUNTER — Encounter: Payer: Self-pay | Admitting: Internal Medicine

## 2016-04-19 ENCOUNTER — Ambulatory Visit (INDEPENDENT_AMBULATORY_CARE_PROVIDER_SITE_OTHER): Payer: Medicare Other | Admitting: Internal Medicine

## 2016-04-19 VITALS — BP 108/70 | HR 72 | Temp 98.2°F | Ht 70.75 in | Wt 259.0 lb

## 2016-04-19 DIAGNOSIS — E785 Hyperlipidemia, unspecified: Secondary | ICD-10-CM | POA: Diagnosis not present

## 2016-04-19 DIAGNOSIS — R5383 Other fatigue: Secondary | ICD-10-CM | POA: Diagnosis not present

## 2016-04-19 DIAGNOSIS — I10 Essential (primary) hypertension: Secondary | ICD-10-CM

## 2016-04-19 DIAGNOSIS — Z0000 Encounter for general adult medical examination without abnormal findings: Secondary | ICD-10-CM

## 2016-04-19 DIAGNOSIS — Z7189 Other specified counseling: Secondary | ICD-10-CM

## 2016-04-19 LAB — LIPID PANEL
CHOLESTEROL: 169 mg/dL (ref 0–200)
HDL: 41 mg/dL (ref >39.00)
LDL Cholesterol: 100 mg/dL — ABNORMAL HIGH (ref 0–99)
NonHDL: 127.56
TRIGLYCERIDES: 139 mg/dL (ref 0.0–149.0)
Total CHOL/HDL Ratio: 4
VLDL: 27.8 mg/dL (ref 0.0–40.0)

## 2016-04-19 LAB — CBC WITH DIFFERENTIAL/PLATELET
BASOS PCT: 0.6 % (ref 0.0–3.0)
Basophils Absolute: 0.1 10*3/uL (ref 0.0–0.1)
EOS ABS: 0.4 10*3/uL (ref 0.0–0.7)
EOS PCT: 3.8 % (ref 0.0–5.0)
HEMATOCRIT: 41.1 % (ref 39.0–52.0)
HEMOGLOBIN: 14.1 g/dL (ref 13.0–17.0)
LYMPHS PCT: 18.7 % (ref 12.0–46.0)
Lymphs Abs: 1.9 10*3/uL (ref 0.7–4.0)
MCHC: 34.2 g/dL (ref 30.0–36.0)
MCV: 85.2 fl (ref 78.0–100.0)
Monocytes Absolute: 0.8 10*3/uL (ref 0.1–1.0)
Monocytes Relative: 8.4 % (ref 3.0–12.0)
Neutro Abs: 6.8 10*3/uL (ref 1.4–7.7)
Neutrophils Relative %: 68.5 % (ref 43.0–77.0)
Platelets: 298 10*3/uL (ref 150.0–400.0)
RBC: 4.83 Mil/uL (ref 4.22–5.81)
RDW: 13.7 % (ref 11.5–15.5)
WBC: 10 10*3/uL (ref 4.0–10.5)

## 2016-04-19 LAB — COMPREHENSIVE METABOLIC PANEL
ALBUMIN: 4.4 g/dL (ref 3.5–5.2)
ALK PHOS: 77 U/L (ref 39–117)
ALT: 12 U/L (ref 0–53)
AST: 19 U/L (ref 0–37)
BILIRUBIN TOTAL: 0.5 mg/dL (ref 0.2–1.2)
BUN: 18 mg/dL (ref 6–23)
CALCIUM: 9.7 mg/dL (ref 8.4–10.5)
CHLORIDE: 103 meq/L (ref 96–112)
CO2: 31 mEq/L (ref 19–32)
CREATININE: 0.88 mg/dL (ref 0.40–1.50)
GFR: 90.03 mL/min (ref >60.00)
Glucose, Bld: 120 mg/dL — ABNORMAL HIGH (ref 70–99)
Potassium: 4.5 mEq/L (ref 3.5–5.1)
Sodium: 141 mEq/L (ref 135–145)
TOTAL PROTEIN: 7.2 g/dL (ref 6.0–8.3)

## 2016-04-19 LAB — T4, FREE: Free T4: 0.76 ng/dL (ref 0.60–1.60)

## 2016-04-19 NOTE — Telephone Encounter (Signed)
Error

## 2016-04-19 NOTE — Assessment & Plan Note (Signed)
Satisfied with the primary prevention

## 2016-04-19 NOTE — Assessment & Plan Note (Signed)
BP Readings from Last 3 Encounters:  04/19/16 108/70  10/21/15 118/80  10/17/15 132/74   Good control No dizziness Continue current dose

## 2016-04-19 NOTE — Progress Notes (Signed)
Pre visit review using our clinic review tool, if applicable. No additional management support is needed unless otherwise documented below in the visit note. 

## 2016-04-19 NOTE — Patient Instructions (Signed)
DASH Eating Plan DASH stands for "Dietary Approaches to Stop Hypertension." The DASH eating plan is a healthy eating plan that has been shown to reduce high blood pressure (hypertension). Additional health benefits may include reducing the risk of type 2 diabetes mellitus, heart disease, and stroke. The DASH eating plan may also help with weight loss. What do I need to know about the DASH eating plan? For the DASH eating plan, you will follow these general guidelines:  Choose foods with less than 150 milligrams of sodium per serving (as listed on the food label).  Use salt-free seasonings or herbs instead of table salt or sea salt.  Check with your health care provider or pharmacist before using salt substitutes.  Eat lower-sodium products. These are often labeled as "low-sodium" or "no salt added."  Eat fresh foods. Avoid eating a lot of canned foods.  Eat more vegetables, fruits, and low-fat dairy products.  Choose whole grains. Look for the word "whole" as the first word in the ingredient list.  Choose fish and skinless chicken or turkey more often than red meat. Limit fish, poultry, and meat to 6 oz (170 g) each day.  Limit sweets, desserts, sugars, and sugary drinks.  Choose heart-healthy fats.  Eat more home-cooked food and less restaurant, buffet, and fast food.  Limit fried foods.  Do not fry foods. Cook foods using methods such as baking, boiling, grilling, and broiling instead.  When eating at a restaurant, ask that your food be prepared with less salt, or no salt if possible. What foods can I eat? Seek help from a dietitian for individual calorie needs. Grains  Whole grain or whole wheat bread. Brown rice. Whole grain or whole wheat pasta. Quinoa, bulgur, and whole grain cereals. Low-sodium cereals. Corn or whole wheat flour tortillas. Whole grain cornbread. Whole grain crackers. Low-sodium crackers. Vegetables  Fresh or frozen vegetables (raw, steamed, roasted, or  grilled). Low-sodium or reduced-sodium tomato and vegetable juices. Low-sodium or reduced-sodium tomato sauce and paste. Low-sodium or reduced-sodium canned vegetables. Fruits  All fresh, canned (in natural juice), or frozen fruits. Meat and Other Protein Products  Ground beef (85% or leaner), grass-fed beef, or beef trimmed of fat. Skinless chicken or turkey. Ground chicken or turkey. Pork trimmed of fat. All fish and seafood. Eggs. Dried beans, peas, or lentils. Unsalted nuts and seeds. Unsalted canned beans. Dairy  Low-fat dairy products, such as skim or 1% milk, 2% or reduced-fat cheeses, low-fat ricotta or cottage cheese, or plain low-fat yogurt. Low-sodium or reduced-sodium cheeses. Fats and Oils  Tub margarines without trans fats. Light or reduced-fat mayonnaise and salad dressings (reduced sodium). Avocado. Safflower, olive, or canola oils. Natural peanut or almond butter. Other  Unsalted popcorn and pretzels. The items listed above may not be a complete list of recommended foods or beverages. Contact your dietitian for more options.  What foods are not recommended? Grains  White bread. White pasta. White rice. Refined cornbread. Bagels and croissants. Crackers that contain trans fat. Vegetables  Creamed or fried vegetables. Vegetables in a cheese sauce. Regular canned vegetables. Regular canned tomato sauce and paste. Regular tomato and vegetable juices. Fruits  Canned fruit in light or heavy syrup. Fruit juice. Meat and Other Protein Products  Fatty cuts of meat. Ribs, chicken wings, bacon, sausage, bologna, salami, chitterlings, fatback, hot dogs, bratwurst, and packaged luncheon meats. Salted nuts and seeds. Canned beans with salt. Dairy  Whole or 2% milk, cream, half-and-half, and cream cheese. Whole-fat or sweetened yogurt. Full-fat cheeses   or blue cheese. Nondairy creamers and whipped toppings. Processed cheese, cheese spreads, or cheese curds. Condiments  Onion and garlic  salt, seasoned salt, table salt, and sea salt. Canned and packaged gravies. Worcestershire sauce. Tartar sauce. Barbecue sauce. Teriyaki sauce. Soy sauce, including reduced sodium. Steak sauce. Fish sauce. Oyster sauce. Cocktail sauce. Horseradish. Ketchup and mustard. Meat flavorings and tenderizers. Bouillon cubes. Hot sauce. Tabasco sauce. Marinades. Taco seasonings. Relishes. Fats and Oils  Butter, stick margarine, lard, shortening, ghee, and bacon fat. Coconut, palm kernel, or palm oils. Regular salad dressings. Other  Pickles and olives. Salted popcorn and pretzels. The items listed above may not be a complete list of foods and beverages to avoid. Contact your dietitian for more information.  Where can I find more information? National Heart, Lung, and Blood Institute: www.nhlbi.nih.gov/health/health-topics/topics/dash/ This information is not intended to replace advice given to you by your health care provider. Make sure you discuss any questions you have with your health care provider. Document Released: 03/31/2011 Document Revised: 09/17/2015 Document Reviewed: 02/13/2013 Elsevier Interactive Patient Education  2017 Elsevier Inc.  

## 2016-04-19 NOTE — Assessment & Plan Note (Signed)
Vague but continues Some better than the summer No findings or worrisome history features Will just recheck labs

## 2016-04-19 NOTE — Assessment & Plan Note (Signed)
Now has formal POA--wife

## 2016-04-19 NOTE — Progress Notes (Signed)
Subjective:    Patient ID: Evan Giles, male    DOB: June 16, 1940, 75 y.o.   MRN: [id number]  HPI Here for Medicare wellness and follow up on chronic health conditions Reviewed form and advanced directives Reviewed other doctors No alcohol or tobacco Now better about regular exercise again Vision is fine Hearing aides---they are okay No falls No depression or anhedonia Independent with instrumental ADLs No apparent memory problems  Still feels tired Doesn't have as much energy as in the past Feels that it is better than this summer Did try off the statin for a few weeks--no change so restarted Does notice more muscle cramps--and "deeper"  No dizziness or syncope No chest pain Rare headaches Feels his exercise tolerance is improving No edema Breathing is okay  Knee is better "It is better than it has been for 10 years" No medications   . Current Outpatient Prescriptions on File Prior to Visit  Medication Sig Dispense Refill  . aspirin 81 MG tablet Take 81 mg by mouth daily.    Marland Kitchen atorvastatin (LIPITOR) 20 MG tablet TAKE ONE TABLET EVERY DAY 90 tablet 2  . Calcium Citrate-Vitamin D (CALCIUM + D PO) Take 1 tablet by mouth every morning.    Marland Kitchen losartan-hydrochlorothiazide (HYZAAR) 100-12.5 MG tablet TAKE ONE TABLET BY MOUTH EVERY DAY 90 tablet 2  . MAGNESIUM CHLORIDE PO Take 1 tablet by mouth every morning.     No current facility-administered medications on file prior to visit.     Allergies  Allergen Reactions  . Hydrocodone Itching    Past Medical History:  Diagnosis Date  . Hyperlipidemia   . Hypertension   . Osteoarthrosis of knee   . Peptic ulcer    years ago    Past Surgical History:  Procedure Laterality Date  . CATARACT EXTRACTION W/ INTRAOCULAR LENS IMPLANT  8/12  . dental implant  08/2015  . JOINT REPLACEMENT  3/13   Left knee---Dr Hooten  . KNEE ARTHROSCOPY Right 09/07/2015   Procedure: ARTHROSCOPY KNEE, PARTIAL MEDIAL AND LATERAL  MENISECTOMY, chondroplasty of patella femoral.;  Surgeon: Dereck Leep, MD;  Location: [facility] ORS;  Service: Orthopedics;  Laterality: Right;  . MENISCUS DEBRIDEMENT  2/12   Dr Roberts Gaudy  . RETINAL LASER PROCEDURE  4/12   Dr Darrick Grinder  . TONSILLECTOMY AND ADENOIDECTOMY  1966    Family History  Problem Relation Age of Onset  . Heart disease Father   . Stroke Father     not certain about this  . Cancer Sister     unknown  . Diabetes Maternal Grandmother   . Obesity Sister     Social History   Social History  . Marital status: Married    Spouse name: N/A  . Number of children: 2  . 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 Smoking status: Never Smoker  . Smokeless tobacco: Never Used  . Alcohol use No  . Drug use: No  . Sexual activity: Not on file   Other Topics Concern  . Not on file   Social History Narrative   I son and 1 daughter   Has living will   Wife is health care POA   Would accept resuscitation   Probably would accept feeding tube--at least for a limited time   Review of Systems Appetite is good Weight is about the same--had lost 10# till  the holidays Sleeps fair--awakens at least once Takes super beta prostate-- seems to help his voiding (nocturia down to 1 again) No daytime problems voiding Wears seat belt Teeth are okay--- had implant in May (Dr Carman Ching). Regular dentist-- Dr Nicola Girt No back pain. Some shoulder pain Sees dermatologist-- Dr Phillip Heal Bowels are fine--no blood in stool Rare heartburn (tums)--no dysphagia    Objective:   Physical Exam  Constitutional: He is oriented to person, place, and time. He appears well-developed and well-nourished. No distress.  HENT:  Mouth/Throat: Oropharynx is clear and moist. No oropharyngeal exudate.  Neck: Normal range of motion. Neck supple. No thyromegaly present.  Cardiovascular: Normal rate, regular  rhythm, normal heart sounds and intact distal pulses.  Exam reveals no gallop.   No murmur heard. Pulmonary/Chest: Effort normal and breath sounds normal. No respiratory distress. He has no wheezes. He has no rales.  Abdominal: Soft. There is no tenderness.  Musculoskeletal: He exhibits no edema or tenderness.  Lymphadenopathy:    He has no cervical adenopathy.  Neurological: He is alert and oriented to person, place, and time.  President--- "Dwaine Deter, Bush" [id number] Recall 3/3  Skin: No rash noted. No erythema.  Psychiatric: He has a normal mood and affect. His behavior is normal.          Assessment & Plan:

## 2016-04-19 NOTE — Assessment & Plan Note (Signed)
I have personally reviewed the Medicare Annual Wellness questionnaire and have noted 1. The patient's medical and social history 2. Their use of alcohol, tobacco or illicit drugs 3. Their current medications and supplements 4. The patient's functional ability including ADL's, fall risks, home safety risks and hearing or visual             impairment. 5. Diet and physical activities 6. Evidence for depression or mood disorders  The patients weight, height, BMI and visual acuity have been recorded in the chart I have made referrals, counseling and provided education to the patient based review of the above and I have provided the pt with a written personalized care plan for preventive services.  I have provided you with a copy of your personalized plan for preventive services. Please take the time to review along with your updated medication list.  UTD on imms No PSA due to age Due for colon screening next year--will discuss then

## 2016-04-25 HISTORY — PX: OTHER SURGICAL HISTORY: SHX169

## 2016-04-27 DIAGNOSIS — M9901 Segmental and somatic dysfunction of cervical region: Secondary | ICD-10-CM | POA: Diagnosis not present

## 2016-04-27 DIAGNOSIS — M5136 Other intervertebral disc degeneration, lumbar region: Secondary | ICD-10-CM | POA: Diagnosis not present

## 2016-04-27 DIAGNOSIS — M9903 Segmental and somatic dysfunction of lumbar region: Secondary | ICD-10-CM | POA: Diagnosis not present

## 2016-04-27 DIAGNOSIS — M5412 Radiculopathy, cervical region: Secondary | ICD-10-CM | POA: Diagnosis not present

## 2016-06-01 DIAGNOSIS — M9903 Segmental and somatic dysfunction of lumbar region: Secondary | ICD-10-CM | POA: Diagnosis not present

## 2016-06-01 DIAGNOSIS — M5136 Other intervertebral disc degeneration, lumbar region: Secondary | ICD-10-CM | POA: Diagnosis not present

## 2016-06-01 DIAGNOSIS — M5412 Radiculopathy, cervical region: Secondary | ICD-10-CM | POA: Diagnosis not present

## 2016-06-01 DIAGNOSIS — M9901 Segmental and somatic dysfunction of cervical region: Secondary | ICD-10-CM | POA: Diagnosis not present

## 2016-06-07 ENCOUNTER — Other Ambulatory Visit: Payer: Self-pay | Admitting: Internal Medicine

## 2016-06-29 DIAGNOSIS — M9901 Segmental and somatic dysfunction of cervical region: Secondary | ICD-10-CM | POA: Diagnosis not present

## 2016-06-29 DIAGNOSIS — M5412 Radiculopathy, cervical region: Secondary | ICD-10-CM | POA: Diagnosis not present

## 2016-06-29 DIAGNOSIS — M9903 Segmental and somatic dysfunction of lumbar region: Secondary | ICD-10-CM | POA: Diagnosis not present

## 2016-06-29 DIAGNOSIS — M5136 Other intervertebral disc degeneration, lumbar region: Secondary | ICD-10-CM | POA: Diagnosis not present

## 2016-08-03 DIAGNOSIS — M5412 Radiculopathy, cervical region: Secondary | ICD-10-CM | POA: Diagnosis not present

## 2016-08-03 DIAGNOSIS — M9901 Segmental and somatic dysfunction of cervical region: Secondary | ICD-10-CM | POA: Diagnosis not present

## 2016-08-03 DIAGNOSIS — M5136 Other intervertebral disc degeneration, lumbar region: Secondary | ICD-10-CM | POA: Diagnosis not present

## 2016-08-03 DIAGNOSIS — M9903 Segmental and somatic dysfunction of lumbar region: Secondary | ICD-10-CM | POA: Diagnosis not present

## 2016-08-24 DIAGNOSIS — M5412 Radiculopathy, cervical region: Secondary | ICD-10-CM | POA: Diagnosis not present

## 2016-08-24 DIAGNOSIS — M9901 Segmental and somatic dysfunction of cervical region: Secondary | ICD-10-CM | POA: Diagnosis not present

## 2016-08-24 DIAGNOSIS — M9903 Segmental and somatic dysfunction of lumbar region: Secondary | ICD-10-CM | POA: Diagnosis not present

## 2016-08-24 DIAGNOSIS — M5136 Other intervertebral disc degeneration, lumbar region: Secondary | ICD-10-CM | POA: Diagnosis not present

## 2016-09-12 ENCOUNTER — Other Ambulatory Visit: Payer: Self-pay

## 2016-09-12 NOTE — Patient Outreach (Signed)
[facility]) Care Management  09/12/2016  Evan Giles 1940-07-09 [id number]   Medication Adherence call to [name] made a call to [name] because he is past do on losartan 100/12.5 and atorvastatin 20 mg patient said he already pick up medication and does not want a 90 days supply.

## 2016-09-15 DIAGNOSIS — Z96652 Presence of left artificial knee joint: Secondary | ICD-10-CM | POA: Diagnosis not present

## 2016-09-15 DIAGNOSIS — G8929 Other chronic pain: Secondary | ICD-10-CM | POA: Diagnosis not present

## 2016-09-15 DIAGNOSIS — M25561 Pain in right knee: Secondary | ICD-10-CM | POA: Diagnosis not present

## 2016-09-28 DIAGNOSIS — M9903 Segmental and somatic dysfunction of lumbar region: Secondary | ICD-10-CM | POA: Diagnosis not present

## 2016-09-28 DIAGNOSIS — M9901 Segmental and somatic dysfunction of cervical region: Secondary | ICD-10-CM | POA: Diagnosis not present

## 2016-09-28 DIAGNOSIS — M5136 Other intervertebral disc degeneration, lumbar region: Secondary | ICD-10-CM | POA: Diagnosis not present

## 2016-09-28 DIAGNOSIS — M5412 Radiculopathy, cervical region: Secondary | ICD-10-CM | POA: Diagnosis not present

## 2016-11-01 DIAGNOSIS — M9901 Segmental and somatic dysfunction of cervical region: Secondary | ICD-10-CM | POA: Diagnosis not present

## 2016-11-01 DIAGNOSIS — M5136 Other intervertebral disc degeneration, lumbar region: Secondary | ICD-10-CM | POA: Diagnosis not present

## 2016-11-01 DIAGNOSIS — M5412 Radiculopathy, cervical region: Secondary | ICD-10-CM | POA: Diagnosis not present

## 2016-11-01 DIAGNOSIS — M9903 Segmental and somatic dysfunction of lumbar region: Secondary | ICD-10-CM | POA: Diagnosis not present

## 2016-11-30 DIAGNOSIS — M9901 Segmental and somatic dysfunction of cervical region: Secondary | ICD-10-CM | POA: Diagnosis not present

## 2016-11-30 DIAGNOSIS — M5136 Other intervertebral disc degeneration, lumbar region: Secondary | ICD-10-CM | POA: Diagnosis not present

## 2016-11-30 DIAGNOSIS — M9903 Segmental and somatic dysfunction of lumbar region: Secondary | ICD-10-CM | POA: Diagnosis not present

## 2016-11-30 DIAGNOSIS — M9902 Segmental and somatic dysfunction of thoracic region: Secondary | ICD-10-CM | POA: Diagnosis not present

## 2016-12-13 DIAGNOSIS — L280 Lichen simplex chronicus: Secondary | ICD-10-CM | POA: Diagnosis not present

## 2016-12-13 DIAGNOSIS — L918 Other hypertrophic disorders of the skin: Secondary | ICD-10-CM | POA: Diagnosis not present

## 2017-01-03 DIAGNOSIS — M9903 Segmental and somatic dysfunction of lumbar region: Secondary | ICD-10-CM | POA: Diagnosis not present

## 2017-01-03 DIAGNOSIS — M9901 Segmental and somatic dysfunction of cervical region: Secondary | ICD-10-CM | POA: Diagnosis not present

## 2017-01-03 DIAGNOSIS — M9902 Segmental and somatic dysfunction of thoracic region: Secondary | ICD-10-CM | POA: Diagnosis not present

## 2017-01-03 DIAGNOSIS — M5136 Other intervertebral disc degeneration, lumbar region: Secondary | ICD-10-CM | POA: Diagnosis not present

## 2017-01-04 DIAGNOSIS — L280 Lichen simplex chronicus: Secondary | ICD-10-CM | POA: Diagnosis not present

## 2017-02-01 DIAGNOSIS — M9902 Segmental and somatic dysfunction of thoracic region: Secondary | ICD-10-CM | POA: Diagnosis not present

## 2017-02-01 DIAGNOSIS — M9903 Segmental and somatic dysfunction of lumbar region: Secondary | ICD-10-CM | POA: Diagnosis not present

## 2017-02-01 DIAGNOSIS — M5136 Other intervertebral disc degeneration, lumbar region: Secondary | ICD-10-CM | POA: Diagnosis not present

## 2017-02-01 DIAGNOSIS — M9901 Segmental and somatic dysfunction of cervical region: Secondary | ICD-10-CM | POA: Diagnosis not present

## 2017-02-02 DIAGNOSIS — S80812A Abrasion, left lower leg, initial encounter: Secondary | ICD-10-CM | POA: Diagnosis not present

## 2017-02-02 DIAGNOSIS — L089 Local infection of the skin and subcutaneous tissue, unspecified: Secondary | ICD-10-CM | POA: Diagnosis not present

## 2017-02-14 ENCOUNTER — Other Ambulatory Visit: Payer: Self-pay | Admitting: Internal Medicine

## 2017-03-18 ENCOUNTER — Other Ambulatory Visit: Payer: Self-pay | Admitting: Internal Medicine

## 2017-04-10 DIAGNOSIS — Z86018 Personal history of other benign neoplasm: Secondary | ICD-10-CM | POA: Diagnosis not present

## 2017-04-10 DIAGNOSIS — L2089 Other atopic dermatitis: Secondary | ICD-10-CM | POA: Diagnosis not present

## 2017-04-10 DIAGNOSIS — Z859 Personal history of malignant neoplasm, unspecified: Secondary | ICD-10-CM | POA: Diagnosis not present

## 2017-04-10 DIAGNOSIS — D485 Neoplasm of uncertain behavior of skin: Secondary | ICD-10-CM | POA: Diagnosis not present

## 2017-04-11 DIAGNOSIS — M9902 Segmental and somatic dysfunction of thoracic region: Secondary | ICD-10-CM | POA: Diagnosis not present

## 2017-04-11 DIAGNOSIS — M9903 Segmental and somatic dysfunction of lumbar region: Secondary | ICD-10-CM | POA: Diagnosis not present

## 2017-04-11 DIAGNOSIS — M9901 Segmental and somatic dysfunction of cervical region: Secondary | ICD-10-CM | POA: Diagnosis not present

## 2017-04-11 DIAGNOSIS — M5136 Other intervertebral disc degeneration, lumbar region: Secondary | ICD-10-CM | POA: Diagnosis not present

## 2017-04-21 ENCOUNTER — Encounter: Payer: Self-pay | Admitting: Internal Medicine

## 2017-04-21 ENCOUNTER — Ambulatory Visit (INDEPENDENT_AMBULATORY_CARE_PROVIDER_SITE_OTHER): Payer: Medicare Other | Admitting: Internal Medicine

## 2017-04-21 VITALS — BP 138/86 | HR 70 | Temp 98.1°F | Ht 71.0 in | Wt 258.0 lb

## 2017-04-21 DIAGNOSIS — Z0000 Encounter for general adult medical examination without abnormal findings: Secondary | ICD-10-CM

## 2017-04-21 DIAGNOSIS — E785 Hyperlipidemia, unspecified: Secondary | ICD-10-CM

## 2017-04-21 DIAGNOSIS — Z23 Encounter for immunization: Secondary | ICD-10-CM

## 2017-04-21 DIAGNOSIS — R5383 Other fatigue: Secondary | ICD-10-CM

## 2017-04-21 DIAGNOSIS — I10 Essential (primary) hypertension: Secondary | ICD-10-CM | POA: Diagnosis not present

## 2017-04-21 DIAGNOSIS — R7301 Impaired fasting glucose: Secondary | ICD-10-CM

## 2017-04-21 DIAGNOSIS — Z7189 Other specified counseling: Secondary | ICD-10-CM | POA: Diagnosis not present

## 2017-04-21 DIAGNOSIS — Z1211 Encounter for screening for malignant neoplasm of colon: Secondary | ICD-10-CM

## 2017-04-21 LAB — CBC
HCT: 41.2 % (ref 39.0–52.0)
Hemoglobin: 13.7 g/dL (ref 13.0–17.0)
MCHC: 33.2 g/dL (ref 30.0–36.0)
MCV: 87 fl (ref 78.0–100.0)
Platelets: 310 K/uL (ref 150.0–400.0)
RBC: 4.74 Mil/uL (ref 4.22–5.81)
RDW: 14.2 % (ref 11.5–15.5)
WBC: 7.1 K/uL (ref 4.0–10.5)

## 2017-04-21 LAB — COMPREHENSIVE METABOLIC PANEL WITH GFR
ALT: 10 U/L (ref 0–53)
AST: 15 U/L (ref 0–37)
Albumin: 4.4 g/dL (ref 3.5–5.2)
Alkaline Phosphatase: 75 U/L (ref 39–117)
BUN: 15 mg/dL (ref 6–23)
CO2: 30 meq/L (ref 19–32)
Calcium: 9.2 mg/dL (ref 8.4–10.5)
Chloride: 103 meq/L (ref 96–112)
Creatinine, Ser: 0.9 mg/dL (ref 0.40–1.50)
GFR: 87.48 mL/min
Glucose, Bld: 114 mg/dL — ABNORMAL HIGH (ref 70–99)
Potassium: 4.5 meq/L (ref 3.5–5.1)
Sodium: 139 meq/L (ref 135–145)
Total Bilirubin: 0.6 mg/dL (ref 0.2–1.2)
Total Protein: 7.1 g/dL (ref 6.0–8.3)

## 2017-04-21 LAB — VITAMIN B12: Vitamin B-12: 572 pg/mL (ref 211–911)

## 2017-04-21 LAB — LIPID PANEL
CHOL/HDL RATIO: 5
Cholesterol: 181 mg/dL (ref 0–200)
HDL: 33.3 mg/dL — ABNORMAL LOW (ref >39.00)
LDL Cholesterol: 110 mg/dL — ABNORMAL HIGH (ref 0–99)
NONHDL: 147.47
Triglycerides: 185 mg/dL — ABNORMAL HIGH (ref 0.0–149.0)
VLDL: 37 mg/dL (ref 0.0–40.0)

## 2017-04-21 LAB — T4, FREE: FREE T4: 0.73 ng/dL (ref 0.60–1.60)

## 2017-04-21 LAB — HEMOGLOBIN A1C: HEMOGLOBIN A1C: 6.6 % — AB (ref 4.6–6.5)

## 2017-04-21 NOTE — Assessment & Plan Note (Signed)
Hopefully not worse

## 2017-04-21 NOTE — Assessment & Plan Note (Signed)
No problems with statin for primary prevention 

## 2017-04-21 NOTE — Addendum Note (Signed)
Addended by: Pilar Grammes on: 04/21/2017 09:29 AM   Modules accepted: Orders

## 2017-04-21 NOTE — Assessment & Plan Note (Signed)
BP Readings from Last 3 Encounters:  04/21/17 138/86  04/19/16 108/70  10/21/15 118/80   Good control Due for labs

## 2017-04-21 NOTE — Assessment & Plan Note (Signed)
I have personally reviewed the Medicare Annual Wellness questionnaire and have noted 1. The patient's medical and social history 2. Their use of alcohol, tobacco or illicit drugs 3. Their current medications and supplements 4. The patient's functional ability including ADL's, fall risks, home safety risks and hearing or visual             impairment. 5. Diet and physical activities 6. Evidence for depression or mood disorders  The patients weight, height, BMI and visual acuity have been recorded in the chart I have made referrals, counseling and provided education to the patient based review of the above and I have provided the pt with a written personalized care plan for preventive services.  I have provided you with a copy of your personalized plan for preventive services. Please take the time to review along with your updated medication list.  Yearly flu vaccine Pneumovax booster today Will check FIT over the next few years Discussed DASH and fitness

## 2017-04-21 NOTE — Patient Instructions (Signed)
DASH Eating Plan DASH stands for "Dietary Approaches to Stop Hypertension." The DASH eating plan is a healthy eating plan that has been shown to reduce high blood pressure (hypertension). It may also reduce your risk for type 2 diabetes, heart disease, and stroke. The DASH eating plan may also help with weight loss. What are tips for following this plan? General guidelines  Avoid eating more than 2,300 mg (milligrams) of salt (sodium) a day. If you have hypertension, you may need to reduce your sodium intake to 1,500 mg a day.  Limit alcohol intake to no more than 1 drink a day for nonpregnant women and 2 drinks a day for men. One drink equals 12 oz of beer, 5 oz of wine, or 1 oz of hard liquor.  Work with your health care provider to maintain a healthy body weight or to lose weight. Ask what an ideal weight is for you.  Get at least 30 minutes of exercise that causes your heart to beat faster (aerobic exercise) most days of the week. Activities may include walking, swimming, or biking.  Work with your health care provider or diet and nutrition specialist (dietitian) to adjust your eating plan to your individual calorie needs. Reading food labels  Check food labels for the amount of sodium per serving. Choose foods with less than 5 percent of the Daily Value of sodium. Generally, foods with less than 300 mg of sodium per serving fit into this eating plan.  To find whole grains, look for the word "whole" as the first word in the ingredient list. Shopping  Buy products labeled as "low-sodium" or "no salt added."  Buy fresh foods. Avoid canned foods and premade or frozen meals. Cooking  Avoid adding salt when cooking. Use salt-free seasonings or herbs instead of table salt or sea salt. Check with your health care provider or pharmacist before using salt substitutes.  Do not fry foods. Cook foods using healthy methods such as baking, boiling, grilling, and broiling instead.  Cook with  heart-healthy oils, such as olive, canola, soybean, or sunflower oil. Meal planning   Eat a balanced diet that includes: ? 5 or more servings of fruits and vegetables each day. At each meal, try to fill half of your plate with fruits and vegetables. ? Up to 6-8 servings of whole grains each day. ? Less than 6 oz of lean meat, poultry, or fish each day. A 3-oz serving of meat is about the same size as a deck of cards. One egg equals 1 oz. ? 2 servings of low-fat dairy each day. ? A serving of nuts, seeds, or beans 5 times each week. ? Heart-healthy fats. Healthy fats called Omega-3 fatty acids are found in foods such as flaxseeds and coldwater fish, like sardines, salmon, and mackerel.  Limit how much you eat of the following: ? Canned or prepackaged foods. ? Food that is high in trans fat, such as fried foods. ? Food that is high in saturated fat, such as fatty meat. ? Sweets, desserts, sugary drinks, and other foods with added sugar. ? Full-fat dairy products.  Do not salt foods before eating.  Try to eat at least 2 vegetarian meals each week.  Eat more home-cooked food and less restaurant, buffet, and fast food.  When eating at a restaurant, ask that your food be prepared with less salt or no salt, if possible. What foods are recommended? The items listed may not be a complete list. Talk with your dietitian about what   dietary choices are best for you. Grains Whole-grain or whole-wheat bread. Whole-grain or whole-wheat pasta. Brown rice. Oatmeal. Quinoa. Bulgur. Whole-grain and low-sodium cereals. Pita bread. Low-fat, low-sodium crackers. Whole-wheat flour tortillas. Vegetables Fresh or frozen vegetables (raw, steamed, roasted, or grilled). Low-sodium or reduced-sodium tomato and vegetable juice. Low-sodium or reduced-sodium tomato sauce and tomato paste. Low-sodium or reduced-sodium canned vegetables. Fruits All fresh, dried, or frozen fruit. Canned fruit in natural juice (without  added sugar). Meat and other protein foods Skinless chicken or turkey. Ground chicken or turkey. Pork with fat trimmed off. Fish and seafood. Egg whites. Dried beans, peas, or lentils. Unsalted nuts, nut butters, and seeds. Unsalted canned beans. Lean cuts of beef with fat trimmed off. Low-sodium, lean deli meat. Dairy Low-fat (1%) or fat-free (skim) milk. Fat-free, low-fat, or reduced-fat cheeses. Nonfat, low-sodium ricotta or cottage cheese. Low-fat or nonfat yogurt. Low-fat, low-sodium cheese. Fats and oils Soft margarine without trans fats. Vegetable oil. Low-fat, reduced-fat, or light mayonnaise and salad dressings (reduced-sodium). Canola, safflower, olive, soybean, and sunflower oils. Avocado. Seasoning and other foods Herbs. Spices. Seasoning mixes without salt. Unsalted popcorn and pretzels. Fat-free sweets. What foods are not recommended? The items listed may not be a complete list. Talk with your dietitian about what dietary choices are best for you. Grains Baked goods made with fat, such as croissants, muffins, or some breads. Dry pasta or rice meal packs. Vegetables Creamed or fried vegetables. Vegetables in a cheese sauce. Regular canned vegetables (not low-sodium or reduced-sodium). Regular canned tomato sauce and paste (not low-sodium or reduced-sodium). Regular tomato and vegetable juice (not low-sodium or reduced-sodium). Pickles. Olives. Fruits Canned fruit in a light or heavy syrup. Fried fruit. Fruit in cream or butter sauce. Meat and other protein foods Fatty cuts of meat. Ribs. Fried meat. Bacon. Sausage. Bologna and other processed lunch meats. Salami. Fatback. Hotdogs. Bratwurst. Salted nuts and seeds. Canned beans with added salt. Canned or smoked fish. Whole eggs or egg yolks. Chicken or turkey with skin. Dairy Whole or 2% milk, cream, and half-and-half. Whole or full-fat cream cheese. Whole-fat or sweetened yogurt. Full-fat cheese. Nondairy creamers. Whipped toppings.  Processed cheese and cheese spreads. Fats and oils Butter. Stick margarine. Lard. Shortening. Ghee. Bacon fat. Tropical oils, such as coconut, palm kernel, or palm oil. Seasoning and other foods Salted popcorn and pretzels. Onion salt, garlic salt, seasoned salt, table salt, and sea salt. Worcestershire sauce. Tartar sauce. Barbecue sauce. Teriyaki sauce. Soy sauce, including reduced-sodium. Steak sauce. Canned and packaged gravies. Fish sauce. Oyster sauce. Cocktail sauce. Horseradish that you find on the shelf. Ketchup. Mustard. Meat flavorings and tenderizers. Bouillon cubes. Hot sauce and Tabasco sauce. Premade or packaged marinades. Premade or packaged taco seasonings. Relishes. Regular salad dressings. Where to find more information:  National Heart, Lung, and Blood Institute: www.nhlbi.nih.gov  American Heart Association: www.heart.org Summary  The DASH eating plan is a healthy eating plan that has been shown to reduce high blood pressure (hypertension). It may also reduce your risk for type 2 diabetes, heart disease, and stroke.  With the DASH eating plan, you should limit salt (sodium) intake to 2,300 mg a day. If you have hypertension, you may need to reduce your sodium intake to 1,500 mg a day.  When on the DASH eating plan, aim to eat more fresh fruits and vegetables, whole grains, lean proteins, low-fat dairy, and heart-healthy fats.  Work with your health care provider or diet and nutrition specialist (dietitian) to adjust your eating plan to your individual   calorie needs. This information is not intended to replace advice given to you by your health care provider. Make sure you discuss any questions you have with your health care provider. Document Released: 03/31/2011 Document Revised: 04/04/2016 Document Reviewed: 04/04/2016 Elsevier Interactive Patient Education  2018 Elsevier Inc.  

## 2017-04-21 NOTE — Progress Notes (Signed)
Visual Acuity Screening   Right eye Left eye Both eyes  Without correction:     With correction: 20/25 20/20 20/20   Hearing Screening Comments: Has hearing aids. Not wearing them today

## 2017-04-21 NOTE — Assessment & Plan Note (Signed)
Mild and chronic Will just check labs

## 2017-04-21 NOTE — Assessment & Plan Note (Signed)
See social history 

## 2017-04-21 NOTE — Progress Notes (Signed)
Subjective:    Patient ID: Evan Giles, male    DOB: [birth year], 76 y.o.   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 No syncope No edema Stable exercise tolerance Rare headaches  Current Outpatient Medications on File Prior to Visit  Medication Sig Dispense Refill  . aspirin 81 MG tablet Take 81 mg by mouth daily.    Marland Kitchen atorvastatin (LIPITOR) 20 MG tablet TAKE 1 TABLET BY MOUTH DAILY 90 tablet 0  . Calcium Citrate-Vitamin D (CALCIUM + D PO) Take 1 tablet by mouth every morning.    Marland Kitchen losartan-hydrochlorothiazide (HYZAAR) 100-12.5 MG tablet TAKE ONE TABLET EVERY DAY 90 tablet 3  . MAGNESIUM CHLORIDE PO Take 1 tablet by mouth every morning.    Marland Kitchen OVER THE COUNTER MEDICATION Super Beta Prostate     No current facility-administered medications on file prior to visit.     Allergies  Allergen Reactions  . Hydrocodone Itching    Past Medical History:  Diagnosis Date  . Hyperlipidemia   . Hypertension   . Osteoarthrosis of knee   . Peptic ulcer    years ago    Past Surgical History:  Procedure Laterality Date  . CATARACT EXTRACTION W/ INTRAOCULAR LENS IMPLANT  8/12  . dental implant  08/2015  . JOINT REPLACEMENT  3/13   Left knee---Dr Hooten  . KNEE  ARTHROSCOPY Right 09/07/2015   Procedure: ARTHROSCOPY KNEE, PARTIAL MEDIAL AND LATERAL MENISECTOMY, chondroplasty of patella femoral.;  Surgeon: Dereck Leep, MD;  Location: [facility] ORS;  Service: Orthopedics;  Laterality: Right;  . MENISCUS DEBRIDEMENT  2/12   Dr Roberts Gaudy  . RETINAL LASER PROCEDURE  4/12   Dr Darrick Grinder  . TONSILLECTOMY AND ADENOIDECTOMY  1966    Family History  Problem Relation Age of Onset  . Heart disease Father   . Stroke Father        not certain about this  . Cancer Sister        unknown  . Diabetes Maternal Grandmother   . Obesity Sister     Social History   Socioeconomic History  . Marital status: Married    Spouse name: Not on file  . Number of children: 2  . Years of education: Not on file  . Highest education level: Not on file  Social Needs  . Financial resource strain: Not on file  . Food insecurity - worry: Not on file  . Food insecurity - inability: Not on file  . Transportation needs - medical: Not on file  . Transportation needs - non-medical: Not on file  Occupational History  . Occupation: Engineer, drilling    Comment: mostly Medicare advantage  Tobacco Use  . Smoking status: Never Smoker  . Smokeless tobacco: Never Used  Substance and Sexual Activity  . Alcohol use: No  Alcohol/week: 0.0 oz  . Drug use: No  . Sexual activity: Not on file  Other Topics Concern  . Not on file  Social History Narrative   I son and 1 daughter   Has living will   Wife is health care POA --- alternate would be son   Would accept resuscitation---but no prolonged ventilation   Probably would accept feeding tube--at least for a limited time   Review of Systems Appetite is good Weight stable--trying to watch Sleeps fair--awakens once usually, not clearly for voiding Urine flow is fine in day Bowels are fine--no blood No significant back pain. Occasional mild hip pain--favoring due to knee? Keeps up with dermatologist--due for biopsy of  spot on right arm Wears seat belt Teeth are okay---keeps up with dentist Occasional heartburn---tums help. No dysphagia    Objective:   Physical Exam  Constitutional: He is oriented to person, place, and time. No distress.  HENT:  Mouth/Throat: Oropharynx is clear and moist. No oropharyngeal exudate.  Neck: No thyromegaly present.  Cardiovascular: Normal rate, regular rhythm, normal heart sounds and intact distal pulses. Exam reveals no gallop.  No murmur heard. Pulmonary/Chest: Effort normal and breath sounds normal. No respiratory distress. He has no wheezes. He has no rales.  Abdominal: Soft. He exhibits no distension. There is no tenderness. There is no rebound and no guarding.  Musculoskeletal: He exhibits no edema or tenderness.  Lymphadenopathy:    He has no cervical adenopathy.  Neurological: He is alert and oriented to person, place, and time.  President--- "Dwaine Deter, Bush" [phone] [id number] Recall 3/3  Skin: No rash noted. No erythema.  Psychiatric: He has a normal mood and affect. His behavior is normal.          Assessment & Plan:

## 2017-05-01 DIAGNOSIS — D485 Neoplasm of uncertain behavior of skin: Secondary | ICD-10-CM | POA: Diagnosis not present

## 2017-05-02 DIAGNOSIS — M9902 Segmental and somatic dysfunction of thoracic region: Secondary | ICD-10-CM | POA: Diagnosis not present

## 2017-05-02 DIAGNOSIS — M9903 Segmental and somatic dysfunction of lumbar region: Secondary | ICD-10-CM | POA: Diagnosis not present

## 2017-05-02 DIAGNOSIS — M5136 Other intervertebral disc degeneration, lumbar region: Secondary | ICD-10-CM | POA: Diagnosis not present

## 2017-05-02 DIAGNOSIS — M9901 Segmental and somatic dysfunction of cervical region: Secondary | ICD-10-CM | POA: Diagnosis not present

## 2017-05-05 ENCOUNTER — Other Ambulatory Visit (INDEPENDENT_AMBULATORY_CARE_PROVIDER_SITE_OTHER): Payer: Medicare Other

## 2017-05-05 DIAGNOSIS — Z1211 Encounter for screening for malignant neoplasm of colon: Secondary | ICD-10-CM | POA: Diagnosis not present

## 2017-05-05 LAB — FECAL OCCULT BLOOD, IMMUNOCHEMICAL: FECAL OCCULT BLD: NEGATIVE

## 2017-05-18 DIAGNOSIS — H35371 Puckering of macula, right eye: Secondary | ICD-10-CM | POA: Diagnosis not present

## 2017-05-19 ENCOUNTER — Other Ambulatory Visit: Payer: Self-pay | Admitting: Internal Medicine

## 2017-05-23 DIAGNOSIS — Z9889 Other specified postprocedural states: Secondary | ICD-10-CM | POA: Diagnosis not present

## 2017-05-23 DIAGNOSIS — G8929 Other chronic pain: Secondary | ICD-10-CM | POA: Diagnosis not present

## 2017-05-23 DIAGNOSIS — Z96652 Presence of left artificial knee joint: Secondary | ICD-10-CM | POA: Diagnosis not present

## 2017-05-23 DIAGNOSIS — M25561 Pain in right knee: Secondary | ICD-10-CM | POA: Diagnosis not present

## 2017-05-23 DIAGNOSIS — Z96653 Presence of artificial knee joint, bilateral: Secondary | ICD-10-CM | POA: Diagnosis not present

## 2017-06-20 ENCOUNTER — Other Ambulatory Visit: Payer: Self-pay | Admitting: Internal Medicine

## 2017-08-08 DIAGNOSIS — M25561 Pain in right knee: Secondary | ICD-10-CM | POA: Diagnosis not present

## 2017-08-08 DIAGNOSIS — G8929 Other chronic pain: Secondary | ICD-10-CM | POA: Diagnosis not present

## 2017-08-21 ENCOUNTER — Other Ambulatory Visit: Payer: Self-pay | Admitting: Orthopedic Surgery

## 2017-08-21 DIAGNOSIS — M25561 Pain in right knee: Principal | ICD-10-CM

## 2017-08-21 DIAGNOSIS — G8929 Other chronic pain: Secondary | ICD-10-CM

## 2017-08-29 ENCOUNTER — Ambulatory Visit
Admission: RE | Admit: 2017-08-29 | Discharge: 2017-08-29 | Disposition: A | Discharge status: Home / Self Care | Payer: Medicare Other | Source: Ambulatory Visit | Attending: Orthopedic Surgery | Admitting: Orthopedic Surgery

## 2017-08-29 DIAGNOSIS — M25561 Pain in right knee: Secondary | ICD-10-CM

## 2017-08-29 DIAGNOSIS — M23221 Derangement of posterior horn of medial meniscus due to old tear or injury, right knee: Secondary | ICD-10-CM | POA: Diagnosis not present

## 2017-08-29 DIAGNOSIS — M23321 Other meniscus derangements, posterior horn of medial meniscus, right knee: Secondary | ICD-10-CM | POA: Diagnosis not present

## 2017-08-29 DIAGNOSIS — M2410 Other articular cartilage disorders, unspecified site: Secondary | ICD-10-CM | POA: Insufficient documentation

## 2017-08-29 DIAGNOSIS — G8929 Other chronic pain: Secondary | ICD-10-CM

## 2017-09-01 DIAGNOSIS — M2391 Unspecified internal derangement of right knee: Secondary | ICD-10-CM | POA: Diagnosis not present

## 2017-09-19 ENCOUNTER — Encounter
Admission: RE | Admit: 2017-09-19 | Discharge: 2017-09-19 | Disposition: A | Discharge status: Home / Self Care | Payer: Medicare Other | Source: Ambulatory Visit | Attending: Orthopedic Surgery | Admitting: Orthopedic Surgery

## 2017-09-19 ENCOUNTER — Other Ambulatory Visit: Payer: Self-pay

## 2017-09-19 DIAGNOSIS — I10 Essential (primary) hypertension: Secondary | ICD-10-CM | POA: Diagnosis not present

## 2017-09-19 DIAGNOSIS — Z01810 Encounter for preprocedural cardiovascular examination: Secondary | ICD-10-CM | POA: Insufficient documentation

## 2017-09-19 DIAGNOSIS — E786 Lipoprotein deficiency: Secondary | ICD-10-CM | POA: Diagnosis not present

## 2017-09-19 DIAGNOSIS — Z01812 Encounter for preprocedural laboratory examination: Secondary | ICD-10-CM | POA: Insufficient documentation

## 2017-09-19 LAB — CBC
HEMATOCRIT: 38 % — AB (ref 40.0–52.0)
HEMOGLOBIN: 13.4 g/dL (ref 13.0–18.0)
MCH: 30.1 pg (ref 26.0–34.0)
MCHC: 35.3 g/dL (ref 32.0–36.0)
MCV: 85.2 fL (ref 80.0–100.0)
Platelets: 278 10*3/uL (ref 150–440)
RBC: 4.46 MIL/uL (ref 4.40–5.90)
RDW: 13.9 % (ref 11.5–14.5)
WBC: 8.9 10*3/uL (ref 3.8–10.6)

## 2017-09-19 NOTE — Patient Instructions (Signed)
Your procedure is scheduled on: Monday 09/25/17 Report to [place]. To find out your arrival time please call [phone] between 1PM - 3PM on Friday 09/22/17.  Remember: Instructions that are not followed completely may result in serious medical risk, up to and including death, or upon the discretion of your surgeon and anesthesiologist your surgery may need to be rescheduled.     _X__ 1. Do not eat food after midnight the night before your procedure.                 No gum chewing or hard candies. You may drink clear liquids up to 2 hours                 before you are scheduled to arrive for your surgery- DO not drink clear                 liquids within 2 hours of the start of your surgery.                 Clear Liquids include:  water, apple juice without pulp, clear carbohydrate                 drink such as Clearfast or Gatorade, Black Coffee or Tea (Do not add                 anything to coffee or tea).  __X__2.  On the morning of surgery brush your teeth with toothpaste and water, you                 may rinse your mouth with mouthwash if you wish.  Do not swallow any              toothpaste of mouthwash.     _X__ 3.  No Alcohol for 24 hours before or after surgery.   _X__ 4.  Do Not Smoke or use e-cigarettes For 24 Hours Prior to Your Surgery.                 Do not use any chewable tobacco products for at least 6 hours prior to                 surgery.  ____  5.  Bring all medications with you on the day of surgery if instructed.   __X__  6.  Notify your doctor if there is any change in your medical condition      (cold, fever, infections).     Do not wear jewelry, make-up, hairpins, clips or nail polish. Do not wear lotions, powders, or perfumes.  Do not shave 48 hours prior to surgery. Men may shave face and neck. Do not bring valuables to the hospital.    [facility] is not responsible for any belongings or  valuables.  Contacts, dentures/partials or body piercings may not be worn into surgery. Bring a case for your contacts, glasses or hearing aids, a denture cup will be supplied. Leave your suitcase in the car. After surgery it may be brought to your room. For patients admitted to the hospital, discharge time is determined by your treatment team.   Patients discharged the day of surgery will not be allowed to drive home.   Please read over the following fact sheets that you were given:   MRSA Information  __X__ Take these medicines the morning of surgery with A SIP OF WATER:  1. ATORVASTATIN  2.   3.   4.  5.  6.  ____ Fleet Enema (as directed)   __X__ Use CHG Soap/SAGE wipes as directed  ____ Use inhalers on the day of surgery  ____ Stop metformin/Janumet/Farxiga 2 days prior to surgery    ____ Take 1/2 of usual insulin dose the night before surgery. No insulin the morning          of surgery.   __X__ Stop Blood Thinners Coumadin/Plavix/Xarelto/Pleta/Pradaxa/Eliquis/Effient/Aspirin  on TODAY  Or contact your Surgeon, Cardiologist or Medical Doctor regarding  ability to stop your blood thinners  __X__ Stop Anti-inflammatories 7 days before surgery such as Advil, Ibuprofen, Motrin,  BC or Goodies Powder, Naprosyn, Naproxen, Aleve, Aspirin  TODAY MAY TAKE TYLENOL   __X__ Stop all herbal supplements, fish oil or vitamin E until after surgery.    ____ Bring C-Pap to the hospital.

## 2017-09-24 MED ORDER — CEFAZOLIN SODIUM-DEXTROSE 2-4 GM/100ML-% IV SOLN
2.0000 g | INTRAVENOUS | Status: AC
  Administered 2017-09-25: 2 g via INTRAVENOUS

## 2017-09-25 ENCOUNTER — Encounter: Payer: Self-pay | Admitting: Orthopedic Surgery

## 2017-09-25 ENCOUNTER — Other Ambulatory Visit: Payer: Self-pay

## 2017-09-25 ENCOUNTER — Encounter
Admission: RE | Disposition: A | Discharge status: Home / Self Care | Payer: Self-pay | Source: Ambulatory Visit | Attending: Orthopedic Surgery

## 2017-09-25 ENCOUNTER — Ambulatory Visit: Payer: Medicare Other | Admitting: Certified Registered Nurse Anesthetist

## 2017-09-25 ENCOUNTER — Ambulatory Visit
Admission: RE | Admit: 2017-09-25 | Discharge: 2017-09-25 | Disposition: A | Discharge status: Home / Self Care | Payer: Medicare Other | Source: Ambulatory Visit | Attending: Orthopedic Surgery | Admitting: Orthopedic Surgery

## 2017-09-25 DIAGNOSIS — M94261 Chondromalacia, right knee: Secondary | ICD-10-CM | POA: Diagnosis not present

## 2017-09-25 DIAGNOSIS — Z8711 Personal history of peptic ulcer disease: Secondary | ICD-10-CM | POA: Insufficient documentation

## 2017-09-25 DIAGNOSIS — Z9889 Other specified postprocedural states: Secondary | ICD-10-CM

## 2017-09-25 DIAGNOSIS — M23221 Derangement of posterior horn of medial meniscus due to old tear or injury, right knee: Secondary | ICD-10-CM | POA: Diagnosis not present

## 2017-09-25 DIAGNOSIS — E785 Hyperlipidemia, unspecified: Secondary | ICD-10-CM | POA: Insufficient documentation

## 2017-09-25 DIAGNOSIS — M23241 Derangement of anterior horn of lateral meniscus due to old tear or injury, right knee: Secondary | ICD-10-CM | POA: Diagnosis not present

## 2017-09-25 DIAGNOSIS — M1710 Unilateral primary osteoarthritis, unspecified knee: Secondary | ICD-10-CM | POA: Insufficient documentation

## 2017-09-25 DIAGNOSIS — Z79899 Other long term (current) drug therapy: Secondary | ICD-10-CM | POA: Diagnosis not present

## 2017-09-25 DIAGNOSIS — Z7982 Long term (current) use of aspirin: Secondary | ICD-10-CM | POA: Insufficient documentation

## 2017-09-25 DIAGNOSIS — I10 Essential (primary) hypertension: Secondary | ICD-10-CM | POA: Diagnosis not present

## 2017-09-25 DIAGNOSIS — Z885 Allergy status to narcotic agent status: Secondary | ICD-10-CM | POA: Insufficient documentation

## 2017-09-25 DIAGNOSIS — M1990 Unspecified osteoarthritis, unspecified site: Secondary | ICD-10-CM | POA: Insufficient documentation

## 2017-09-25 HISTORY — PX: KNEE ARTHROSCOPY: SHX127

## 2017-09-25 HISTORY — PX: KNEE ARTHROSCOPY WITH MEDIAL MENISECTOMY: SHX5651

## 2017-09-25 SURGERY — ARTHROSCOPY, KNEE, WITH MEDIAL MENISCECTOMY
Anesthesia: General | Site: Knee | Laterality: Right | Wound class: Clean

## 2017-09-25 MED ORDER — KETAMINE HCL 50 MG/ML IJ SOLN
INTRAMUSCULAR | Status: DC | PRN
  Administered 2017-09-25: 35 mg via INTRAMUSCULAR

## 2017-09-25 MED ORDER — FENTANYL CITRATE (PF) 100 MCG/2ML IJ SOLN
INTRAMUSCULAR | Status: AC
  Administered 2017-09-25: 25 ug via INTRAVENOUS
  Filled 2017-09-25: qty 2

## 2017-09-25 MED ORDER — ONDANSETRON HCL 4 MG/2ML IJ SOLN
INTRAMUSCULAR | Status: AC
  Filled 2017-09-25: qty 2

## 2017-09-25 MED ORDER — FAMOTIDINE 20 MG PO TABS
20.0000 mg | ORAL_TABLET | Freq: Once | ORAL | Status: AC
  Administered 2017-09-25: 20 mg via ORAL

## 2017-09-25 MED ORDER — HYDROCODONE-ACETAMINOPHEN 5-325 MG PO TABS: 1.0000 | ORAL_TABLET | ORAL | 0 refills | Status: DC | PRN

## 2017-09-25 MED ORDER — SEVOFLURANE IN SOLN
RESPIRATORY_TRACT | Status: AC
  Filled 2017-09-25: qty 250

## 2017-09-25 MED ORDER — PROPOFOL 10 MG/ML IV BOLUS
INTRAVENOUS | Status: AC
  Filled 2017-09-25: qty 20

## 2017-09-25 MED ORDER — FENTANYL CITRATE (PF) 100 MCG/2ML IJ SOLN
INTRAMUSCULAR | Status: DC | PRN
  Administered 2017-09-25 (×4): 25 ug via INTRAVENOUS

## 2017-09-25 MED ORDER — CEFAZOLIN SODIUM-DEXTROSE 2-4 GM/100ML-% IV SOLN
INTRAVENOUS | Status: AC
  Filled 2017-09-25: qty 100

## 2017-09-25 MED ORDER — FENTANYL CITRATE (PF) 100 MCG/2ML IJ SOLN
25.0000 ug | INTRAMUSCULAR | Status: AC | PRN
  Administered 2017-09-25 (×6): 25 ug via INTRAVENOUS

## 2017-09-25 MED ORDER — HYDROCODONE-ACETAMINOPHEN 5-325 MG PO TABS
ORAL_TABLET | ORAL | Status: AC
  Administered 2017-09-25: 1
  Filled 2017-09-25: qty 1

## 2017-09-25 MED ORDER — LIDOCAINE HCL (CARDIAC) PF 100 MG/5ML IV SOSY
PREFILLED_SYRINGE | INTRAVENOUS | Status: DC | PRN
Start: 2017-09-25 — End: 2017-09-25
  Administered 2017-09-25: 100 mg via INTRAVENOUS

## 2017-09-25 MED ORDER — LIDOCAINE HCL (PF) 2 % IJ SOLN
INTRAMUSCULAR | Status: AC
  Filled 2017-09-25: qty 10

## 2017-09-25 MED ORDER — PROPOFOL 10 MG/ML IV BOLUS
INTRAVENOUS | Status: DC | PRN
  Administered 2017-09-25: 180 mg via INTRAVENOUS

## 2017-09-25 MED ORDER — BUPIVACAINE-EPINEPHRINE (PF) 0.25% -1:200000 IJ SOLN
INTRAMUSCULAR | Status: AC
  Filled 2017-09-25: qty 30

## 2017-09-25 MED ORDER — MORPHINE SULFATE (PF) 4 MG/ML IV SOLN
INTRAVENOUS | Status: AC
  Filled 2017-09-25: qty 1

## 2017-09-25 MED ORDER — MORPHINE SULFATE 4 MG/ML IJ SOLN
INTRAMUSCULAR | Status: DC | PRN
  Administered 2017-09-25: 4 mg via INTRAVENOUS

## 2017-09-25 MED ORDER — GLYCOPYRROLATE 0.2 MG/ML IJ SOLN
INTRAMUSCULAR | Status: AC
  Filled 2017-09-25: qty 1

## 2017-09-25 MED ORDER — ONDANSETRON HCL 4 MG/2ML IJ SOLN
INTRAMUSCULAR | Status: DC | PRN
Start: 2017-09-25 — End: 2017-09-25
  Administered 2017-09-25: 4 mg via INTRAVENOUS

## 2017-09-25 MED ORDER — ACETAMINOPHEN 10 MG/ML IV SOLN
INTRAVENOUS | Status: DC | PRN
  Administered 2017-09-25: 1000 mg via INTRAVENOUS

## 2017-09-25 MED ORDER — GLYCOPYRROLATE 0.2 MG/ML IJ SOLN
INTRAMUSCULAR | Status: DC | PRN
  Administered 2017-09-25: 0.2 mg via INTRAVENOUS

## 2017-09-25 MED ORDER — BUPIVACAINE-EPINEPHRINE (PF) 0.25% -1:200000 IJ SOLN
INTRAMUSCULAR | Status: DC | PRN
  Administered 2017-09-25: 25 mL
  Administered 2017-09-25: 5 mL

## 2017-09-25 MED ORDER — ONDANSETRON HCL 4 MG/2ML IJ SOLN: 4.0000 mg | Freq: Once | INTRAMUSCULAR | Status: DC | PRN

## 2017-09-25 MED ORDER — LACTATED RINGERS IV SOLN: INTRAVENOUS | Status: DC

## 2017-09-25 MED ORDER — ACETAMINOPHEN 10 MG/ML IV SOLN
INTRAVENOUS | Status: AC
  Filled 2017-09-25: qty 100

## 2017-09-25 MED ORDER — FENTANYL CITRATE (PF) 100 MCG/2ML IJ SOLN
INTRAMUSCULAR | Status: AC
  Filled 2017-09-25: qty 2

## 2017-09-25 MED ORDER — SODIUM CHLORIDE 0.9 % IV SOLN
INTRAVENOUS | Status: DC
  Administered 2017-09-25: 10:00:00 via INTRAVENOUS

## 2017-09-25 MED ORDER — CHLORHEXIDINE GLUCONATE 4 % EX LIQD: 60.0000 mL | Freq: Once | CUTANEOUS | Status: DC

## 2017-09-25 MED ORDER — FAMOTIDINE 20 MG PO TABS
ORAL_TABLET | ORAL | Status: AC
  Administered 2017-09-25: 20 mg via ORAL
  Filled 2017-09-25: qty 1

## 2017-09-25 SURGICAL SUPPLY — 26 items
ADAPTER IRRIG TUBE 2 SPIKE SOL (ADAPTER) ×6 IMPLANT
BLADE CLIPPER SURG (BLADE) ×3 IMPLANT
BLADE SHAVER 4.5 DBL SERAT CV (CUTTER) ×3 IMPLANT
CUFF TOURN 24 STER (MISCELLANEOUS) IMPLANT
CUFF TOURN 30 STER DUAL PORT (MISCELLANEOUS) ×3 IMPLANT
DRSG DERMACEA 8X12 NADH (GAUZE/BANDAGES/DRESSINGS) ×3 IMPLANT
DURAPREP 26ML APPLICATOR (WOUND CARE) ×6 IMPLANT
GAUZE SPONGE 4X4 12PLY STRL (GAUZE/BANDAGES/DRESSINGS) ×3 IMPLANT
GLOVE BIOGEL M STRL SZ7.5 (GLOVE) ×3 IMPLANT
GLOVE BIOGEL PI IND STRL 7.5 (GLOVE) ×4 IMPLANT
GLOVE BIOGEL PI INDICATOR 7.5 (GLOVE) ×8
GLOVE INDICATOR 8.0 STRL GRN (GLOVE) ×3 IMPLANT
GOWN STRL REUS W/ TWL LRG LVL3 (GOWN DISPOSABLE) ×2 IMPLANT
GOWN STRL REUS W/TWL LRG LVL3 (GOWN DISPOSABLE) ×4
IV LACTATED RINGER IRRG 3000ML (IV SOLUTION) ×18
IV LR IRRIG 3000ML ARTHROMATIC (IV SOLUTION) ×9 IMPLANT
KIT TURNOVER KIT A (KITS) ×3 IMPLANT
MANIFOLD NEPTUNE II (INSTRUMENTS) ×3 IMPLANT
PACK ARTHROSCOPY KNEE (MISCELLANEOUS) ×3 IMPLANT
SET TUBE SUCT SHAVER OUTFL 24K (TUBING) ×3 IMPLANT
SET TUBE TIP INTRA-ARTICULAR (MISCELLANEOUS) ×3 IMPLANT
SUT ETHILON 3-0 FS-10 30 BLK (SUTURE) ×3
SUTURE EHLN 3-0 FS-10 30 BLK (SUTURE) ×1 IMPLANT
TUBING ARTHRO INFLOW-ONLY STRL (TUBING) ×3 IMPLANT
WAND HAND CNTRL MULTIVAC 50 (MISCELLANEOUS) ×3 IMPLANT
WRAP KNEE W/COLD PACKS 25.5X14 (SOFTGOODS) ×3 IMPLANT

## 2017-09-25 NOTE — Anesthesia Procedure Notes (Signed)
Procedure Name: LMA Insertion Date/Time: 09/25/2017 11:38 AM Performed by: Bernardo Heater, CRNA Pre-anesthesia Checklist: Patient identified, Emergency Drugs available, Suction available and Patient being monitored Patient Re-evaluated:Patient Re-evaluated prior to induction Oxygen Delivery Method: Circle system utilized Preoxygenation: Pre-oxygenation with 100% oxygen Induction Type: IV induction LMA Size: 5.0 Number of attempts: 1 Placement Confirmation: breath sounds checked- equal and bilateral and positive ETCO2 Tube secured with: Tape Dental Injury: Teeth and Oropharynx as per pre-operative assessment

## 2017-09-25 NOTE — Discharge Instructions (Signed)
  Instructions after Knee Arthroscopy    James P. Hooten, Jr., M.D.     Dept. of Orthopaedics & Sports Medicine  [facility]  [address]  [place], [place]  [address]   Phone: [phone]   Fax: [phone]   DIET: . Drink plenty of non-alcoholic fluids & begin a light diet. . Resume your normal diet the day after surgery.  ACTIVITY:  . You may use crutches or a walker with weight-bearing as tolerated, unless instructed otherwise. . You may wean yourself off of the walker or crutches as tolerated.  . Begin doing gentle exercises. Exercising will reduce the pain and swelling, increase motion, and prevent muscle weakness.   . Avoid strenuous activities or athletics for a minimum of 4-6 weeks after arthroscopic surgery. . Do not drive or operate any equipment until instructed.  WOUND CARE:  . Place one to two pillows under the knee the first day or two when sitting or lying.  . Continue to use the ice packs periodically to reduce pain and swelling. . The small incisions in your knee are closed with nylon stitches. The stitches will be removed in the office. . The bulky dressing may be removed on the second day after surgery. DO NOT TOUCH THE STITCHES. Put a Band-Aid over each stitch. Do NOT use any ointments or creams on the incisions.  . You may bathe or shower after the stitches are removed at the first office visit following surgery.  MEDICATIONS: . You may resume your regular medications. . Please take the pain medication as prescribed. . Do not take pain medication on an empty stomach. . Do not drive or drink alcoholic beverages when taking pain medications.  CALL THE OFFICE FOR: . Temperature above 101 degrees . Excessive bleeding or drainage on the dressing. . Excessive swelling, coldness, or paleness of the toes. . Persistent nausea and vomiting.  FOLLOW-UP:  . You should have an appointment to return to the office in 7-10 days after surgery.   AMBULATORY  SURGERY  DISCHARGE INSTRUCTIONS   1) The drugs that you were given will stay in your system until tomorrow so for the next 24 hours you should not:  A) Drive an automobile B) Make any legal decisions C) Drink any alcoholic beverage   2) You may resume regular meals tomorrow.  Today it is better to start with liquids and gradually work up to solid foods.  You may eat anything you prefer, but it is better to start with liquids, then soup and crackers, and gradually work up to solid foods.   3) Please notify your doctor immediately if you have any unusual bleeding, trouble breathing, redness and pain at the surgery site, drainage, fever, or pain not relieved by medication.    4) Additional Instructions: TAKE A STOOL SOFTENER TWICE A DAY WHILE TAKING NARCOTIC PAIN MEDICINE TO PREVENT CONSTIPATION   Please contact your physician with any problems or Same Day Surgery at [phone], Monday through Friday 6 am to 4 pm, or [place] [facility] [place] Main number at [phone].     

## 2017-09-25 NOTE — Anesthesia Post-op Follow-up Note (Signed)
Anesthesia QCDR form completed.        

## 2017-09-25 NOTE — H&P (Signed)
The patient has been re-examined, and the chart reviewed, and there have been no interval changes to the documented history and physical.    The risks, benefits, and alternatives have been discussed at length. The patient expressed understanding of the risks benefits and agreed with plans for surgical intervention.  Evan Giles P. Evella Kasal, Jr. M.D.    

## 2017-09-25 NOTE — Op Note (Signed)
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   ASSISTANT: none  ANESTHESIA: general  ESTIMATED BLOOD LOSS: Minimal  FLUIDS REPLACED: 600 mL of crystalloid  TOURNIQUET TIME: Not used  DRAINS: none  IMPLANTS UTILIZED: None  INDICATIONS FOR SURGERY: Evan Giles is a 77 y.o. year old male who has been seen for complaints of right knee pain. MRI demonstrated findings consistent with meniscal pathology. After discussion of the risks and benefits of surgical intervention, the patient expressed understanding of the risks benefits and agree with plans for right knee arthroscopy.   PROCEDURE IN DETAIL: The patient was brought into the operating room and, after adequate general anesthesia was achieved, a tourniquet was applied to the right thigh and the leg was placed in the leg holder. All bony prominences were well padded. The patient's right knee was cleaned and prepped with alcohol and Duraprep and draped in the usual sterile fashion. A "timeout" was performed as per usual protocol. The anticipated portal sites were injected with 0.25% Marcaine with epinephrine. An anterolateral incision was made and a cannula was inserted. A moderate effusion was evacuated and the knee was distended with fluid using the pump. The scope was advanced down the medial gutter into the medial compartment. Under visualization with the scope, an anteromedial portal was created and a hooked probe was inserted. The medial meniscus  was visualized and probed.  There was a complex tear involving the posterior horn of the medial meniscus.  A horizontal component was noted to the tear.  The tear was debrided using meniscal punches and a 4.5 mm incisor shaver.  Final contour was performed using the 50 degree ArthroCare wand.  The articular cartilage was visualized.  There were some grade 2-3 changes of chondromalacia involving the medial femoral condyle.  The medial tibial plateau appeared to be well preserved.  The scope was then advanced into the intercondylar notch. The anterior cruciate ligament was visualized and probed and felt to be intact. The scope was removed from the lateral portal and reinserted via the anteromedial portal to better visualize the lateral compartment. The lateral meniscus was visualized and probed.  There was a tear involving the anterior horn of the lateral meniscus.  The tear was debrided using the 4.5 mm incisor shaver and then contoured using the 50 degree ArthroCare wand.  The remaining rim of meniscus was visualized and probed felt be stable.  The articular cartilage of the lateral compartment was visualized and noted to be in good condition.  Finally, the scope was advanced so as to visualize the patellofemoral articulation. Good patellar tracking was appreciated.  The articular surface was in reasonably good condition.  The knee was irrigated with copius amounts of fluid and suctioned dry. The anterolateral portal was re-approximated with #3-0 nylon. A combination of 0.25% Marcaine with epinephrine and 4 mg of Morphine were injected via the scope. The scope was removed and the anteromedial portal was re-approximated with #3-0 nylon. A sterile dressing was applied followed by application of an  ice wrap.  The patient tolerated the procedure well and was transported to the PACU in stable condition.  Evan Giles., M.D.

## 2017-09-25 NOTE — Anesthesia Preprocedure Evaluation (Signed)
Anesthesia Evaluation  Patient identified by MRN, date of birth, ID band Patient awake    Reviewed: Allergy & Precautions, H&P , NPO status , Patient's Chart, lab work & pertinent test results, reviewed documented beta blocker date and time   History of Anesthesia Complications Negative for: history of anesthetic complications  Airway Mallampati: III  TM Distance: >3 FB Neck ROM: full    Dental no notable dental hx. (+) Implants, Caps, Teeth Intact Patient reports that top right medial incisor is temp cap and is somewhat loose:   Pulmonary neg pulmonary ROS,    Pulmonary exam normal breath sounds clear to auscultation       Cardiovascular Exercise Tolerance: Good hypertension, On Medications (-) angina(-) CAD, (-) Past MI, (-) Cardiac Stents and (-) CABG Normal cardiovascular exam(-) dysrhythmias (-) Valvular Problems/Murmurs Rhythm:regular Rate:Normal     Neuro/Psych negative neurological ROS  negative psych ROS   GI/Hepatic Neg liver ROS, PUD,   Endo/Other  negative endocrine ROS  Renal/GU negative Renal ROS  negative genitourinary   Musculoskeletal  (+) Arthritis ,   Abdominal   Peds  Hematology negative hematology ROS (+)   Anesthesia Other Findings Past Medical History:   Hyperlipidemia                                               Hypertension                                                 Osteoarthrosis of knee                                       Peptic ulcer                                                   Comment:years ago   Reproductive/Obstetrics negative OB ROS                             Anesthesia Physical  Anesthesia Plan  ASA: II  Anesthesia Plan: General   Post-op Pain Management:    Induction:   PONV Risk Score and Plan:   Airway Management Planned:   Additional Equipment:   Intra-op Plan:   Post-operative Plan:   Informed Consent: I have  reviewed the patients History and Physical, chart, labs and discussed the procedure including the risks, benefits and alternatives for the proposed anesthesia with the patient or authorized representative who has indicated his/her understanding and acceptance.   Dental Advisory Given  Plan Discussed with: Anesthesiologist, CRNA and Surgeon  Anesthesia Plan Comments:         Anesthesia Quick Evaluation

## 2017-09-25 NOTE — Transfer of Care (Signed)
Immediate Anesthesia Transfer of Care Note  Patient: Evan Giles  Procedure(s) Performed: ARTHROSCOPY KNEE (Right Knee) KNEE ARTHROSCOPY WITH MEDIAL MENISECTOMY (Right Knee)  Patient Location: PACU  Anesthesia Type:General  Level of Consciousness: awake and patient cooperative  Airway & Oxygen Therapy: Patient Spontanous Breathing and Patient connected to nasal cannula oxygen  Post-op Assessment: Report given to RN and Post -op Vital signs reviewed and stable  Post vital signs: Reviewed and stable  Last Vitals:  Vitals Value Taken Time  BP    Temp    Pulse 73 09/25/2017  1:27 PM  Resp    SpO2 97 % 09/25/2017  1:27 PM  Vitals shown include unvalidated device data.  Last Pain:  Vitals:   09/25/17 0955  TempSrc: Oral  PainSc: 3          Complications: No apparent anesthesia complications

## 2017-09-26 ENCOUNTER — Encounter: Payer: Self-pay | Admitting: Orthopedic Surgery

## 2017-09-26 NOTE — Anesthesia Postprocedure Evaluation (Signed)
Anesthesia Post Note  Patient: Evan Giles  Procedure(s) Performed: ARTHROSCOPY KNEE (Right Knee) KNEE ARTHROSCOPY WITH MEDIAL MENISECTOMY (Right Knee)  Patient location during evaluation: PACU Anesthesia Type: General Level of consciousness: awake and alert and oriented Pain management: pain level controlled Vital Signs Assessment: post-procedure vital signs reviewed and stable Respiratory status: spontaneous breathing Cardiovascular status: blood pressure returned to baseline Anesthetic complications: no     Last Vitals:  Vitals:   09/25/17 1428 09/25/17 1508  BP: (!) 147/95 (!) 147/82  Pulse: (!) 56 (!) 56  Resp: 18 18  Temp: (!) 35.9 C (!) 36.2 C  SpO2: 99%     Last Pain:  Vitals:   09/26/17 0837  TempSrc:   PainSc: 6                  Kacie Huxtable

## 2017-10-11 DIAGNOSIS — M9902 Segmental and somatic dysfunction of thoracic region: Secondary | ICD-10-CM | POA: Diagnosis not present

## 2017-10-11 DIAGNOSIS — M9901 Segmental and somatic dysfunction of cervical region: Secondary | ICD-10-CM | POA: Diagnosis not present

## 2017-10-11 DIAGNOSIS — M5136 Other intervertebral disc degeneration, lumbar region: Secondary | ICD-10-CM | POA: Diagnosis not present

## 2017-10-11 DIAGNOSIS — M9903 Segmental and somatic dysfunction of lumbar region: Secondary | ICD-10-CM | POA: Diagnosis not present

## 2017-11-14 DIAGNOSIS — Z9889 Other specified postprocedural states: Secondary | ICD-10-CM | POA: Diagnosis not present

## 2017-11-14 DIAGNOSIS — R269 Unspecified abnormalities of gait and mobility: Secondary | ICD-10-CM | POA: Diagnosis not present

## 2017-11-14 DIAGNOSIS — M9903 Segmental and somatic dysfunction of lumbar region: Secondary | ICD-10-CM | POA: Diagnosis not present

## 2017-11-14 DIAGNOSIS — M9901 Segmental and somatic dysfunction of cervical region: Secondary | ICD-10-CM | POA: Diagnosis not present

## 2017-11-14 DIAGNOSIS — M5136 Other intervertebral disc degeneration, lumbar region: Secondary | ICD-10-CM | POA: Diagnosis not present

## 2017-11-14 DIAGNOSIS — M9902 Segmental and somatic dysfunction of thoracic region: Secondary | ICD-10-CM | POA: Diagnosis not present

## 2017-11-15 DIAGNOSIS — Z1283 Encounter for screening for malignant neoplasm of skin: Secondary | ICD-10-CM | POA: Diagnosis not present

## 2017-11-15 DIAGNOSIS — L578 Other skin changes due to chronic exposure to nonionizing radiation: Secondary | ICD-10-CM | POA: Diagnosis not present

## 2017-11-15 DIAGNOSIS — Z86018 Personal history of other benign neoplasm: Secondary | ICD-10-CM | POA: Diagnosis not present

## 2017-11-15 DIAGNOSIS — Z859 Personal history of malignant neoplasm, unspecified: Secondary | ICD-10-CM | POA: Diagnosis not present

## 2017-11-29 ENCOUNTER — Telehealth: Payer: Self-pay | Admitting: Internal Medicine

## 2017-11-29 DIAGNOSIS — M9903 Segmental and somatic dysfunction of lumbar region: Secondary | ICD-10-CM | POA: Diagnosis not present

## 2017-11-29 DIAGNOSIS — M9901 Segmental and somatic dysfunction of cervical region: Secondary | ICD-10-CM | POA: Diagnosis not present

## 2017-11-29 DIAGNOSIS — M5136 Other intervertebral disc degeneration, lumbar region: Secondary | ICD-10-CM | POA: Diagnosis not present

## 2017-11-29 DIAGNOSIS — M9902 Segmental and somatic dysfunction of thoracic region: Secondary | ICD-10-CM | POA: Diagnosis not present

## 2017-11-29 NOTE — Telephone Encounter (Signed)
Copied from Evan Giles [phone]. Topic: General - Other >> Nov 29, 2017  4:25 PM Carolyn Stare wrote:  Pt would like to see Dr Silvio Pate on Friday 12/01/17 and is asking if he can be worked in   I spoke to patient to find out why he needs to be seen.  Patient said he had knee surgery in June by Dr.Hooten.  When he went for his follow up appointment he told Dr.Hooten he has trouble walking,lifting, and his arms and legs feel like rubber.  Dr.Hooten wanted to refer him to a neurologist at [facility].  Patient wants to see Dr.Letvak to discuss getting a referral to a [facility] neurologist. Can patient be seen on Friday?

## 2017-11-30 NOTE — Telephone Encounter (Signed)
Okay to put in same day appt

## 2017-11-30 NOTE — Telephone Encounter (Signed)
I spoke to patient and he scheduled appointment on 12/01/17 at 10:15.

## 2017-12-01 ENCOUNTER — Encounter: Payer: Self-pay | Admitting: Internal Medicine

## 2017-12-01 ENCOUNTER — Ambulatory Visit (INDEPENDENT_AMBULATORY_CARE_PROVIDER_SITE_OTHER): Payer: Medicare Other | Admitting: Internal Medicine

## 2017-12-01 ENCOUNTER — Other Ambulatory Visit: Payer: Self-pay

## 2017-12-01 VITALS — BP 126/72 | HR 85 | Temp 98.1°F | Wt 249.8 lb

## 2017-12-01 DIAGNOSIS — R200 Anesthesia of skin: Secondary | ICD-10-CM | POA: Diagnosis not present

## 2017-12-01 DIAGNOSIS — R270 Ataxia, unspecified: Secondary | ICD-10-CM | POA: Diagnosis not present

## 2017-12-01 DIAGNOSIS — R7303 Prediabetes: Secondary | ICD-10-CM | POA: Diagnosis not present

## 2017-12-01 LAB — VITAMIN B12: VITAMIN B 12: 470 pg/mL (ref 211–911)

## 2017-12-01 LAB — CBC
HEMATOCRIT: 40.1 % (ref 39.0–52.0)
HEMOGLOBIN: 13.4 g/dL (ref 13.0–17.0)
MCHC: 33.5 g/dL (ref 30.0–36.0)
MCV: 85.4 fl (ref 78.0–100.0)
Platelets: 300 10*3/uL (ref 150.0–400.0)
RBC: 4.69 Mil/uL (ref 4.22–5.81)
RDW: 14.3 % (ref 11.5–15.5)
WBC: 8.5 10*3/uL (ref 4.0–10.5)

## 2017-12-01 LAB — T4, FREE: FREE T4: 0.86 ng/dL (ref 0.60–1.60)

## 2017-12-01 LAB — HEMOGLOBIN A1C: HEMOGLOBIN A1C: 6.4 % (ref 4.6–6.5)

## 2017-12-01 LAB — COMPREHENSIVE METABOLIC PANEL
ALBUMIN: 4.3 g/dL (ref 3.5–5.2)
ALT: 10 U/L (ref 0–53)
AST: 17 U/L (ref 0–37)
Alkaline Phosphatase: 79 U/L (ref 39–117)
BUN: 23 mg/dL (ref 6–23)
CO2: 30 mEq/L (ref 19–32)
Calcium: 9.9 mg/dL (ref 8.4–10.5)
Chloride: 105 mEq/L (ref 96–112)
Creatinine, Ser: 0.89 mg/dL (ref 0.40–1.50)
GFR: 88.47 mL/min (ref >60.00)
GLUCOSE: 106 mg/dL — AB (ref 70–99)
POTASSIUM: 4.4 meq/L (ref 3.5–5.1)
SODIUM: 142 meq/L (ref 135–145)
Total Bilirubin: 0.5 mg/dL (ref 0.2–1.2)
Total Protein: 7.1 g/dL (ref 6.0–8.3)

## 2017-12-01 NOTE — Assessment & Plan Note (Signed)
Striking neuropathy symptoms Will recheck labs Doubt his sugars would cause this Will set up with neurology

## 2017-12-01 NOTE — Assessment & Plan Note (Signed)
If worse, I would recommend metformin (but he is not excited about taking medication)

## 2017-12-01 NOTE — Assessment & Plan Note (Signed)
Probably sensory since he does okay with cane May need to consider MRI---but will defer to neurologist

## 2017-12-01 NOTE — Patient Outreach (Signed)
[facility]) Care Management  12/01/2017  Evan Giles 1940/06/04 [id number]   Medication Adherence call to [name] patient has a business number they said patient wont be there until later on this afternoon. patient is due on Atorvastatin 20 mg and Losartan/ Hctz 100/25 mg. Evan Giles is showing past due under [place].   [place] Management Direct Dial [phone]  Fax [phone] [email or web address]

## 2017-12-01 NOTE — Progress Notes (Signed)
Subjective:    Patient ID: Evan Giles, male    DOB: [birth year], 77 y.o.   MRN: [id number]  HPI Here due to sensation changes Sudden onset in past 6 weeks Trouble walking due to balance and strength changes Is back in the gym---but only about 50% of before Needs to use cane for balance  Hands and feet "feel like lead and arms are like rubber" Trouble tying shoes---finger problems  Current Outpatient Medications on File Prior to Visit  Medication Sig Dispense Refill  . acetaminophen (TYLENOL) 500 MG tablet Take 1,000 mg by mouth daily as needed for moderate pain or headache.    Marland Kitchen aspirin 81 MG tablet Take 81 mg by mouth daily.    Marland Kitchen atorvastatin (LIPITOR) 20 MG tablet TAKE ONE TABLET EVERY DAY 90 tablet 3  . Calcium Citrate-Vitamin D (CALCIUM + D PO) Take 1 tablet by mouth every morning.    . diphenhydrAMINE HCl, Sleep, (UNISOM SLEEPGELS) 50 MG CAPS Take 50 mg by mouth at bedtime as needed (UNISOM).    Marland Kitchen HYDROcodone-acetaminophen (NORCO) 5-325 MG tablet Take 1-2 tablets by mouth every 4 (four) hours as needed for moderate pain. 20 tablet 0  . ibuprofen (ADVIL,MOTRIN) 200 MG tablet Take 400 mg by mouth daily as needed for headache or moderate pain.    Marland Kitchen losartan-hydrochlorothiazide (HYZAAR) 100-12.5 MG tablet TAKE ONE TABLET BY MOUTH EVERY DAY 90 tablet 3  . Magnesium Oxide (MAG-OX PO) Take 250 mg by mouth daily.    Marland Kitchen OVER THE COUNTER MEDICATION Take 1 tablet by mouth every evening. Super Beta Prostate      No current facility-administered medications on file prior to visit.     Allergies  Allergen Reactions  . Hydrocodone Itching    Past Medical History:  Diagnosis Date  . Hyperlipidemia   . Hypertension   . Osteoarthrosis of knee   . Peptic ulcer    years ago    Past Surgical History:  Procedure Laterality Date  . CATARACT EXTRACTION W/ INTRAOCULAR LENS IMPLANT  8/12  . dental implant  08/2015  . JOINT REPLACEMENT  3/13   Left knee---Dr Hooten  . KNEE  ARTHROSCOPY Right 09/07/2015   Procedure: ARTHROSCOPY KNEE, PARTIAL MEDIAL AND LATERAL MENISECTOMY, chondroplasty of patella femoral.;  Surgeon: Dereck Leep, MD;  Location: [facility] ORS;  Service: Orthopedics;  Laterality: Right;  . KNEE ARTHROSCOPY Right 09/25/2017   Procedure: ARTHROSCOPY KNEE;  Surgeon: Dereck Leep, MD;  Location: [facility] ORS;  Service: Orthopedics;  Laterality: Right;  . KNEE ARTHROSCOPY WITH MEDIAL MENISECTOMY Right 09/25/2017   Procedure: KNEE ARTHROSCOPY WITH MEDIAL MENISECTOMY;  Surgeon: Dereck Leep, MD;  Location: [facility] ORS;  Service: Orthopedics;  Laterality: Right;  . MENISCUS DEBRIDEMENT  2/12   Dr Roberts Gaudy  . RETINAL LASER PROCEDURE  4/12   Dr Darrick Grinder  . TONSILLECTOMY AND ADENOIDECTOMY  1966    Family History  Problem Relation Age of Onset  . Heart disease Father   . Stroke Father        not certain about this  . Cancer Sister        unknown  . Diabetes Maternal Grandmother   . Obesity Sister     Social History   Socioeconomic History  . Marital status: Married    Spouse name: Not on file  . Number of children: 2  . Years of education: Not on file  . Highest education level: Not on file  Occupational History  . Occupation: [employer]  sales    Comment: mostly Medicare advantage  Social Needs  . Financial resource strain: Not on file  . Food insecurity:    Worry: Not on file    Inability: Not on file  . Transportation needs:    Medical: Not on file    Non-medical: Not on file  Tobacco Use  . Smoking status: Never Smoker  . Smokeless tobacco: Never Used  Substance and Sexual Activity  . Alcohol use: No    Alcohol/week: 0.0 standard drinks  . Drug use: No  . Sexual activity: Not on file  Lifestyle  . Physical activity:    Days per week: Not on file    Minutes per session: Not on file  . Stress: Not on file  Relationships  . Social connections:    Talks on phone: Not on file    Gets together: Not on file    Attends  religious service: Not on file    Active member of club or organization: Not on file    Attends meetings of clubs or organizations: Not on file    Relationship status: Not on file  . Intimate partner violence:    Fear of current or ex partner: Not on file    Emotionally abused: Not on file    Physically abused: Not on file    Forced sexual activity: Not on file  Other Topics Concern  . Not on file  Social History Narrative   I son and 1 daughter   Has living will   Wife is health care POA --- alternate would be son   Would accept resuscitation---but no prolonged ventilation   Probably would accept feeding tube--at least for a limited time   Review of Systems No recent illnesses Appetite fine Has lost a little weight---trying Hasn't check sugars--wife has machine No headaches    Objective:   Physical Exam  Constitutional: He is oriented to person, place, and time. He appears well-developed. No distress.  Neck: No thyromegaly present.  Cardiovascular: Normal rate, regular rhythm, normal heart sounds and intact distal pulses. Exam reveals no gallop.  No murmur heard. Respiratory: Effort normal and breath sounds normal. No respiratory distress. He has no wheezes. He has no rales.  Lymphadenopathy:    He has no cervical adenopathy.  Neurological: He is alert and oriented to person, place, and time. No cranial nerve deficit.  Mild ataxia Decreased fine touch sensation in hands and feet ?slight decreased grip strength  Psychiatric: He has a normal mood and affect. His behavior is normal.           Assessment & Plan:

## 2017-12-04 LAB — PROTEIN ELECTROPHORESIS, SERUM, WITH REFLEX
Albumin ELP: 4.1 g/dL (ref 3.8–4.8)
Alpha 1: 0.3 g/dL (ref 0.2–0.3)
Alpha 2: 0.7 g/dL (ref 0.5–0.9)
Beta 2: 0.3 g/dL (ref 0.2–0.5)
Beta Globulin: 0.4 g/dL (ref 0.4–0.6)
GAMMA GLOBULIN: 0.8 g/dL (ref 0.8–1.7)
TOTAL PROTEIN: 6.6 g/dL (ref 6.1–8.1)

## 2017-12-05 ENCOUNTER — Telehealth: Payer: Self-pay | Admitting: Diagnostic Neuroimaging

## 2017-12-05 ENCOUNTER — Encounter: Payer: Self-pay | Admitting: Diagnostic Neuroimaging

## 2017-12-05 ENCOUNTER — Ambulatory Visit: Payer: Medicare Other | Admitting: Diagnostic Neuroimaging

## 2017-12-05 VITALS — BP 126/78 | HR 80 | Ht 72.0 in | Wt 248.8 lb

## 2017-12-05 DIAGNOSIS — R200 Anesthesia of skin: Secondary | ICD-10-CM

## 2017-12-05 DIAGNOSIS — R531 Weakness: Secondary | ICD-10-CM | POA: Diagnosis not present

## 2017-12-05 DIAGNOSIS — G3281 Cerebellar ataxia in diseases classified elsewhere: Secondary | ICD-10-CM | POA: Diagnosis not present

## 2017-12-05 DIAGNOSIS — L304 Erythema intertrigo: Secondary | ICD-10-CM | POA: Diagnosis not present

## 2017-12-05 NOTE — Progress Notes (Signed)
[facility]  PATIENT: Evan Giles DOB: April 12, 1941  REFERRING CLINICIAN: Silvio Pate HISTORY FROM: patient  REASON FOR VISIT: new consult    HISTORICAL  CHIEF COMPLAINT:  Chief Complaint  Patient presents with  . Ataxia, sensory loss    rm 7, New Pt, wife- [name], "numbness in hands/feet, balance issues x 2 months"    HISTORY OF PRESENT ILLNESS:   77 year old male here for evaluation of numbness and sensory loss.  2 months ago patient had gradual onset of staggering, balance difficulty, feet and legs feeling heavy.  He was also having difficulty raising his arms up in the air.  Sometimes he would have tingling sensation in his arms and legs.  Symptoms have progressively worsened.  No specific triggering or aggravating factors.  No prodromal accidents injuries or traumas.  REVIEW OF SYSTEMS: Full 14 system review of systems performed and negative with exception of: Sleepiness snoring numbness weakness feeling cold fatigue during loss ringing in ears decreased energy.  ALLERGIES: Allergies  Allergen Reactions  . Hydrocodone Itching    HOME MEDICATIONS: Outpatient Medications Prior to Visit  Medication Sig Dispense Refill  . aspirin 81 MG tablet Take 81 mg by mouth daily.    Marland Kitchen atorvastatin (LIPITOR) 20 MG tablet TAKE ONE TABLET EVERY DAY 90 tablet 3  . Calcium Citrate-Vitamin D (CALCIUM + D PO) Take 1 tablet by mouth every morning.    . diphenhydrAMINE HCl, Sleep, (UNISOM SLEEPGELS) 50 MG CAPS Take 50 mg by mouth at bedtime as needed (UNISOM).    Marland Kitchen ibuprofen (ADVIL,MOTRIN) 200 MG tablet Take 400 mg by mouth daily as needed for headache or moderate pain.    Marland Kitchen lisinopril-hydrochlorothiazide (PRINZIDE,ZESTORETIC) 10-12.5 MG tablet Take 1 tablet by mouth daily.    . Magnesium Oxide (MAG-OX PO) Take 250 mg by mouth daily.    Marland Kitchen OVER THE COUNTER MEDICATION Take 1 tablet by mouth every evening. Super Beta Prostate     . losartan-hydrochlorothiazide (HYZAAR)  100-12.5 MG tablet TAKE ONE TABLET BY MOUTH EVERY DAY 90 tablet 3  . acetaminophen (TYLENOL) 500 MG tablet Take 1,000 mg by mouth daily as needed for moderate pain or headache.    . diphenhydrAMINE (BENADRYL) 50 MG capsule Take by mouth as needed.    Marland Kitchen HYDROcodone-acetaminophen (NORCO) 5-325 MG tablet Take 1-2 tablets by mouth every 4 (four) hours as needed for moderate pain. (Patient not taking: Reported on 12/05/2017) 20 tablet 0   No facility-administered medications prior to visit.     PAST MEDICAL HISTORY: Past Medical History:  Diagnosis Date  . Hyperlipidemia   . Hypertension   . Osteoarthrosis of knee   . Peptic ulcer    years ago    PAST SURGICAL HISTORY: Past Surgical History:  Procedure Laterality Date  . CATARACT EXTRACTION W/ INTRAOCULAR LENS IMPLANT  8/12  . dental implant  08/2015  . JOINT REPLACEMENT Left 06/2011   Left knee---Dr Hooten  . KNEE ARTHROSCOPY Right 09/07/2015   Procedure: ARTHROSCOPY KNEE, PARTIAL MEDIAL AND LATERAL MENISECTOMY, chondroplasty of patella femoral.;  Surgeon: Dereck Leep, MD;  Location: [facility];  Service: Orthopedics;  Laterality: Right;  . KNEE ARTHROSCOPY Right 09/25/2017   Procedure: ARTHROSCOPY KNEE;  Surgeon: Dereck Leep, MD;  Location: [facility];  Service: Orthopedics;  Laterality: Right;  . KNEE ARTHROSCOPY WITH MEDIAL MENISECTOMY Right 09/25/2017   Procedure: KNEE ARTHROSCOPY WITH MEDIAL MENISECTOMY;  Surgeon: Dereck Leep, MD;  Location: [facility];  Service: Orthopedics;  Laterality: Right;  .  MENISCUS DEBRIDEMENT Left 05/2010   Dr Roberts Gaudy  . RETINAL LASER PROCEDURE  4/12   Dr Darrick Grinder  . TONSILLECTOMY AND ADENOIDECTOMY  1966    FAMILY HISTORY: Family History  Problem Relation Age of Onset  . Heart disease Father   . Stroke Father        not certain about this  . Cancer Sister        unknown  . Diabetes Maternal Grandmother   . Obesity Sister     SOCIAL HISTORY: Social History   Socioeconomic History    . Marital status: Married    Spouse name: [name]  . Number of children: 2  . Years of education: 36  . Highest education level: Not on file  Occupational History  . Occupation: Engineer, drilling    Comment: mostly Medicare advantage  Social Needs  . Financial resource strain: Not on file  . Food insecurity:    Worry: Not on file    Inability: Not on file  . Transportation needs:    Medical: Not on file    Non-medical: Not on file  Tobacco Use  . Smoking status: Never Smoker  . Smokeless tobacco: Never Used  Substance and Sexual Activity  . Alcohol use: No    Alcohol/week: 0.0 standard drinks  . Drug use: No  . Sexual activity: Not on file  Lifestyle  . Physical activity:    Days per week: Not on file    Minutes per session: Not on file  . Stress: Not on file  Relationships  . Social connections:    Talks on phone: Not on file    Gets together: Not on file    Attends religious service: Not on file    Active member of club or organization: Not on file    Attends meetings of clubs or organizations: Not on file    Relationship status: Not on file  . Intimate partner violence:    Fear of current or ex partner: Not on file    Emotionally abused: Not on file    Physically abused: Not on file    Forced sexual activity: Not on file  Other Topics Concern  . Not on file  Social History Narrative   I son and 1 daughter   Has living will   Wife is health care POA --- alternate would be son   Would accept resuscitation---but no prolonged ventilation   Probably would accept feeding tube--at least for a limited time   Lives with wife   Coffee, 3 cups daily     PHYSICAL EXAM  GENERAL EXAM/CONSTITUTIONAL: Vitals:  Vitals:   12/05/17 0819  BP: 126/78  Pulse: 80  Weight: 248 lb 12.8 oz (112.9 kg)  Height: 6' (1.829 m)     Body mass index is 33.74 kg/m. Wt Readings from Last 3 Encounters:  12/05/17 248 lb 12.8 oz (112.9 kg)  12/01/17 249 lb 12 oz (113.3 kg)   09/25/17 256 lb (116.1 kg)     Patient is in no distress; well developed, nourished and groomed; neck is supple  SMALL FORAMEN (3MM) NEAR THE UPPER BUTTOCKS IN THE MIDLINE; SURROUNDING ERYTHEMA AND INTERTRIGO DRAINAGE  CARDIOVASCULAR:  Examination of carotid arteries is normal; no carotid bruits  Regular rate and rhythm, no murmurs  Examination of peripheral vascular system by observation and palpation is normal  EYES:  Ophthalmoscopic exam of optic discs and posterior segments is normal; no papilledema or hemorrhages  Visual Acuity Screening   Right  eye Left eye Both eyes  Without correction:     With correction: 20/30 20/30      MUSCULOSKELETAL:  Gait, strength, tone, movements noted in Neurologic exam below  NEUROLOGIC: MENTAL STATUS:  No flowsheet data found.  awake, alert, oriented to person, place and time  recent and remote memory intact  normal attention and concentration  language fluent, comprehension intact, naming intact  fund of knowledge appropriate  CRANIAL NERVE:   2nd - no papilledema on fundoscopic exam  2nd, 3rd, 4th, 6th - pupils equal and reactive to light, visual fields full to confrontation, extraocular muscles intact, no nystagmus  5th - facial sensation symmetric  7th - facial strength symmetric  8th - hearing intact  9th - palate elevates symmetrically, uvula midline  11th - shoulder shrug symmetric  12th - tongue protrusion midline  MOTOR:   normal bulk and tone  BUE (DELTOID 3+, BICEPS 4, TRICEPS 4, GRIP 5)  BLE (HIP FLEXION 4; OTHERWISE 5)  SENSORY:   normal and symmetric to light touch  DECR VIB AND TEMP IN FEET  DECR PP IN FINGERTIPS AND TOES / FEET; IN GRADIENT UP TO BODY  COORDINATION:   finger-nose-finger, fine finger movements SLOW; MILD ATAXIA  REFLEXES:   deep tendon reflexes --> BUE BICEPS 0, BRACHIORAD 0, TRICEPS 2; BLE KNEES 2, ANKLES 2  GAIT/STATION:   CAUTIOUS WIDE GAIT; UNSTEADY;  ROMBERG POSITIVE; CANNOT WALK ON TOES; ABLE TO WALK ON HEELS     DIAGNOSTIC DATA (LABS, IMAGING, TESTING) - I reviewed patient records, labs, notes, testing and imaging myself where available.  Lab Results  Component Value Date   WBC 8.5 12/01/2017   HGB 13.4 12/01/2017   HCT 40.1 12/01/2017   MCV 85.4 12/01/2017   PLT 300.0 12/01/2017      Component Value Date/Time   NA 142 12/01/2017 1038   NA 137 07/08/2011 0336   K 4.4 12/01/2017 1038   K 4.0 07/08/2011 0336   CL 105 12/01/2017 1038   CL 99 07/08/2011 0336   CO2 30 12/01/2017 1038   CO2 28 07/08/2011 0336   GLUCOSE 106 (H) 12/01/2017 1038   GLUCOSE 116 (H) 07/08/2011 0336   BUN 23 12/01/2017 1038   BUN 12 07/08/2011 0336   CREATININE 0.89 12/01/2017 1038   CREATININE 0.85 07/08/2011 0336   CALCIUM 9.9 12/01/2017 1038   CALCIUM 8.5 07/08/2011 0336   PROT 7.1 12/01/2017 1038   PROT 6.6 12/01/2017 1038   ALBUMIN 4.3 12/01/2017 1038   AST 17 12/01/2017 1038   ALT 10 12/01/2017 1038   ALKPHOS 79 12/01/2017 1038   BILITOT 0.5 12/01/2017 1038   GFRNONAA >60 07/08/2011 0336   GFRAA >60 07/08/2011 0336   Lab Results  Component Value Date   CHOL 181 04/21/2017   HDL 33.30 (L) 04/21/2017   LDLCALC 110 (H) 04/21/2017   LDLDIRECT 122.0 04/14/2015   TRIG 185.0 (H) 04/21/2017   CHOLHDL 5 04/21/2017   Lab Results  Component Value Date   HGBA1C 6.4 12/01/2017   Lab Results  Component Value Date   VITAMINB12 470 12/01/2017   Lab Results  Component Value Date   TSH 0.42 04/04/2013    08/29/17 MR right knee [I reviewed images myself and agree with interpretation. Traumatic changes as noted. -VRP]  1. Complex tear of the posterior horn of the medial meniscus. 2. Degeneration of the anterior horn and body of the lateral meniscus with an oblique signal component involving the anterior horn-body junction extending  to the superior articular surface concerning for a tear. 3. Tricompartmental cartilage abnormalities as  described above.    ASSESSMENT AND PLAN  77 y.o. year old male here with new onset numbness, weakness in arms and legs starting in June 2019.  We will proceed with further work-up.  Ddx: cervical myelopathy, polyradiculopathy, neuropathy (CIDP)  1. Weakness   2. Cerebellar ataxia in diseases classified elsewhere ([place])   3. Numbness   4. Intertrigo      PLAN:  NUMBNESS / WEAKNESS (arms/legs) - check EMG/NCS - check MRI cervical spine (rule out myelopathy / radiculopathy) - use cane; caution with balance and walking  H/o SPINA BIFIDA - check MRI lumbar spine   INTERTRIGO (skin infection in upper buttocks) - advised to keep skin clean and dry; may use OTC abx cream; may need PCP follow up  Orders Placed This Encounter  Procedures  . MR CERVICAL SPINE WO CONTRAST  . MR LUMBAR SPINE WO CONTRAST  . NCV with EMG(electromyography)   Return for for NCV/EMG.  I reviewed images, labs, notes, records myself. I summarized findings and reviewed with patient, for this high risk condition (arm and leg weakness; rapidly progressive) requiring high complexity decision making.    Penni Bombard, MD 3/74/8270, 7:86 AM Certified in Neurology, Neurophysiology and Neuroimaging  [facility] [address] [place], [place] [address] [phone]

## 2017-12-05 NOTE — Telephone Encounter (Signed)
[employer] Medicare auth: NPR. Patient is scheduled at [facility] for 12/18/17 arrival time is 3:30 pm patient is aware of time & day.

## 2017-12-07 ENCOUNTER — Ambulatory Visit (INDEPENDENT_AMBULATORY_CARE_PROVIDER_SITE_OTHER): Payer: Medicare Other | Admitting: Diagnostic Neuroimaging

## 2017-12-07 ENCOUNTER — Encounter: Payer: Medicare Other | Admitting: Diagnostic Neuroimaging

## 2017-12-07 DIAGNOSIS — Z0289 Encounter for other administrative examinations: Secondary | ICD-10-CM | POA: Diagnosis not present

## 2017-12-07 DIAGNOSIS — R200 Anesthesia of skin: Secondary | ICD-10-CM

## 2017-12-07 DIAGNOSIS — R531 Weakness: Secondary | ICD-10-CM

## 2017-12-08 NOTE — Procedures (Signed)
[facility]  NCS (NERVE CONDUCTION STUDY) WITH EMG (ELECTROMYOGRAPHY) REPORT   STUDY DATE: 12/07/17 PATIENT NAME: Evan Giles DOB: 1941-03-05 MRN: [id number]  ORDERING CLINICIAN: Andrey Spearman, MD   TECHNOLOGIST: Oneita Jolly ELECTROMYOGRAPHER: Earlean Polka. Pinkney Venard, MD  CLINICAL INFORMATION: 77 year old male with numbness and weakness in arms and legs.  FINDINGS: NERVE CONDUCTION STUDY:  Right median motor response has prolonged distal latency, decreased energy, normal conduction velocity.  Right ulnar motor responses normal distal latency, normal amplitude, borderline slow conduction velocity.  Right peroneal, bilateral tibial motor responses have normal distal latencies, normal amplitudes, borderline slow conduction velocities.  Left peroneal motor responses normal distal latency, decreased amplitude, borderline slow conduction velocities.  Right radial, bilateral sural, bilateral superficial peroneal and right median and right ulnar sensory responses have normal peak latencies and decreased amplitude; except right superficial peroneal sensory response has slightly prolonged peak latency.   NEEDLE ELECTROMYOGRAPHY: Examination of right upper and lower extremities notable for abnormal spontaneous activity in the right gastrocnemius, right deltoid, right biceps, right triceps and right lumbar paraspinal muscles.  Early polyphasic motor unit recruitment noted in the right deltoid.  Reduced recruitment of large motor units noted in the right biceps.   IMPRESSION:   Abnormal study demonstrating: - Widespread axonal sensorimotor polyneuropathy.  Sensory nerve responses are slightly more affected than motor responses. - Active and chronic denervation noted in muscles of right upper and right lower extremity.  Myopathic recruitment also noted in right deltoid.    INTERPRETING PHYSICIAN:  Penni Bombard, MD Certified in Neurology, Neurophysiology  and Neuroimaging  [facility] [address], [place], [place] [address] [phone]   [facility]    Nerve / Sites Muscle Latency Ref. Amplitude Ref. Rel Amp Segments Distance Velocity Ref. Area    ms ms mV mV %  cm m/s m/s mVms  R Median - APB     Wrist APB 5.1 ?4.4 3.8 ?4.0 100 Wrist - APB 7   12.8     Upper arm APB 9.6  3.6  95.7 Upper arm - Wrist 23 51 ?49 13.4  R Ulnar - ADM     Wrist ADM 3.2 ?3.3 6.2 ?6.0 100 Wrist - ADM 7   20.8     B.Elbow ADM 7.8  5.7  91 B.Elbow - Wrist 22 47 ?49 19.1     A.Elbow ADM 9.9  5.4  94.8 A.Elbow - B.Elbow 10 47 ?49 18.6         A.Elbow - Wrist      R Peroneal - EDB     Ankle EDB 5.5 ?6.5 2.9 ?2.0 100 Ankle - EDB 9   5.6     Fib head EDB 13.5  2.0  68 Fib head - Ankle 34 42 ?44 5.0     Pop fossa EDB 15.9  1.8  90.2 Pop fossa - Fib head 10 42 ?44 4.1         Pop fossa - Ankle      L Peroneal - EDB     Ankle EDB 5.5 ?6.5 1.4 ?2.0 100 Ankle - EDB 9   2.8     Fib head EDB 13.6  0.7  46.1 Fib head - Ankle 34 42 ?44 2.4     Pop fossa EDB 15.9  0.6  95.8 Pop fossa - Fib head 10 43 ?44 1.5         Pop fossa - Ankle  R Tibial - AH     Ankle AH 5.4 ?5.8 12.4 ?4.0 100 Ankle - AH 9   28.4     Pop fossa AH 15.9  7.9  63.6 Pop fossa - Ankle 40 38 ?41 23.6  L Tibial - AH     Ankle AH 4.9 ?5.8 12.4 ?4.0 100 Ankle - AH 9   32.6     Pop fossa AH 15.7  7.0  56 Pop fossa - Ankle 40 37 ?41 23.5                 SNC    Nerve / Sites Rec. Site Peak Lat Ref.  Amp Ref. Segments Distance    ms ms V V  cm  R Radial - Anatomical snuff box (Forearm)     Forearm Wrist 2.7 ?2.9 7 ?15 Forearm - Wrist 10  R Sural - Ankle (Calf)     Calf Ankle 3.9 ?4.4 4 ?6 Calf - Ankle 14  L Sural - Ankle (Calf)     Calf Ankle 3.2 ?4.4 3 ?6 Calf - Ankle 14  R Superficial peroneal - Ankle     Lat leg Ankle 4.5 ?4.4 2 ?6 Lat leg - Ankle 14  L Superficial peroneal - Ankle     Lat leg Ankle 4.2 ?4.4 2 ?6 Lat leg - Ankle 14  R Median - Orthodromic (Dig II, Mid  palm)     Dig II Wrist 4.1 ?3.4 3 ?10 Dig II - Wrist 13  R Ulnar - Orthodromic, (Dig V, Mid palm)     Dig V Wrist 3.1 ?3.1 3 ?5 Dig V - Wrist 65                   F  Wave    Nerve F Lat Ref.   ms ms  R Tibial - AH 59.3 ?56.0  L Tibial - AH 58.2 ?56.0  R Ulnar - ADM 32.7 ?32.0               EMG full       EMG Summary Table    Spontaneous MUAP Recruitment  Muscle IA Fib PSW Fasc Other Amp Dur. Poly Pattern  R. Vastus medialis Normal None None None _______ Normal Normal Normal Normal  R. Tibialis anterior Normal None None None _______ Normal Normal Normal Normal  R. Gastrocnemius (Medial head) Normal 1+ 1+ None _______ Normal Normal Normal Normal  R. Lumbar paraspinals Normal None 1+ None CRDs Normal Normal Normal Normal  R. Deltoid Normal 2+ 2+ None _______ Normal Normal 2+ Early  R. Biceps brachii Normal 2+ 2+ None _______ Increased Normal Normal Reduced  R. Triceps brachii Normal None 1+ None _______ Normal Normal Normal Normal  R. Flexor carpi radialis Normal None None None _______ Normal Normal Normal Normal  R. First dorsal interosseous Normal None None None _______ Normal Normal Normal Normal  R. Cervical paraspinals Increased None None None _______ Normal Normal Normal Normal

## 2017-12-11 ENCOUNTER — Telehealth: Payer: Self-pay | Admitting: Internal Medicine

## 2017-12-11 DIAGNOSIS — R39198 Other difficulties with micturition: Secondary | ICD-10-CM

## 2017-12-11 NOTE — Telephone Encounter (Signed)
I spoke with pt; pt is having problems urinating; pt feels like needs to urinate and then cannot urinate; no burning or pain when urinates; frequency of urine and voids smaller amts. No noted swelling. No fever. Pt request urology referral.

## 2017-12-11 NOTE — Telephone Encounter (Signed)
Copied from [place] [phone]. Topic: Referral - Request >> Dec 11, 2017  4:43 PM Robina Ade, Helene Kelp D wrote: Reason for CRM: Patient called and he would like a referral to urology. He would like to go to [place] but who ever can get him in sooner. Please call patient back, thanks.

## 2017-12-11 NOTE — Telephone Encounter (Signed)
Referral placed.

## 2017-12-14 ENCOUNTER — Telehealth: Payer: Self-pay | Admitting: Diagnostic Neuroimaging

## 2017-12-14 MED ORDER — ALPRAZOLAM 0.5 MG PO TABS: 0.5000 mg | ORAL_TABLET | ORAL | 0 refills | Status: DC | PRN

## 2017-12-14 NOTE — Telephone Encounter (Signed)
I contacted the patient and advised of the xanax rx being submitted. Patient verbalized understanding and appreciation. Patient had no further questions at this time.  MB RN.

## 2017-12-14 NOTE — Addendum Note (Signed)
Addended by: Andrey Spearman R on: 12/14/2017 01:57 PM   Modules accepted: Orders

## 2017-12-14 NOTE — Telephone Encounter (Signed)
Xanax for MRI ordered.   Meds ordered this encounter  Medications  . ALPRAZolam (XANAX) 0.5 MG tablet    Sig: Take 1 tablet (0.5 mg total) by mouth as needed for anxiety (for sedation before MRI scan; take 1 hour before scan; may repeat 15 min before scan).    Dispense:  3 tablet    Refill:  0   Penni Bombard, MD 06/03/1978, 2:21 PM Certified in Neurology, Neurophysiology and [facility] [address], [place], [place] [address] [phone]

## 2017-12-14 NOTE — Telephone Encounter (Signed)
Pts wife [name] called requesting a medication to help calm the pts nerves during upcoming MRI be sent in to [place].

## 2017-12-14 NOTE — Telephone Encounter (Signed)
Fax confirmation received for xanax for MRI [facility] [phone]. sy

## 2017-12-18 ENCOUNTER — Ambulatory Visit
Admission: RE | Admit: 2017-12-18 | Discharge: 2017-12-18 | Disposition: A | Discharge status: Home / Self Care | Payer: Medicare Other | Source: Ambulatory Visit | Attending: Diagnostic Neuroimaging | Admitting: Diagnostic Neuroimaging

## 2017-12-18 DIAGNOSIS — R531 Weakness: Secondary | ICD-10-CM | POA: Diagnosis present

## 2017-12-18 DIAGNOSIS — M4802 Spinal stenosis, cervical region: Secondary | ICD-10-CM | POA: Diagnosis not present

## 2017-12-18 DIAGNOSIS — M129 Arthropathy, unspecified: Secondary | ICD-10-CM | POA: Diagnosis not present

## 2017-12-18 DIAGNOSIS — M48061 Spinal stenosis, lumbar region without neurogenic claudication: Secondary | ICD-10-CM | POA: Diagnosis not present

## 2017-12-18 DIAGNOSIS — M50223 Other cervical disc displacement at C6-C7 level: Secondary | ICD-10-CM | POA: Diagnosis not present

## 2017-12-18 DIAGNOSIS — M5136 Other intervertebral disc degeneration, lumbar region: Secondary | ICD-10-CM | POA: Insufficient documentation

## 2017-12-18 DIAGNOSIS — G3281 Cerebellar ataxia in diseases classified elsewhere: Secondary | ICD-10-CM | POA: Diagnosis not present

## 2017-12-18 DIAGNOSIS — Q059 Spina bifida, unspecified: Secondary | ICD-10-CM | POA: Insufficient documentation

## 2017-12-18 DIAGNOSIS — R200 Anesthesia of skin: Secondary | ICD-10-CM | POA: Diagnosis present

## 2017-12-18 DIAGNOSIS — M4316 Spondylolisthesis, lumbar region: Secondary | ICD-10-CM | POA: Diagnosis not present

## 2017-12-18 DIAGNOSIS — M5126 Other intervertebral disc displacement, lumbar region: Secondary | ICD-10-CM | POA: Diagnosis not present

## 2017-12-19 ENCOUNTER — Other Ambulatory Visit: Payer: Self-pay | Admitting: Diagnostic Neuroimaging

## 2017-12-19 ENCOUNTER — Telehealth: Payer: Self-pay | Admitting: Diagnostic Neuroimaging

## 2017-12-19 DIAGNOSIS — G9520 Unspecified cord compression: Secondary | ICD-10-CM

## 2017-12-19 MED ORDER — GABAPENTIN 300 MG PO CAPS: 300.0000 mg | ORAL_CAPSULE | Freq: Two times a day (BID) | ORAL | 6 refills | Status: DC

## 2017-12-19 NOTE — Telephone Encounter (Signed)
Contacted [facility] back and spoke with Diane. She stated the MRI of the cervical spine is ready for MD to review. Message fwd to provider.

## 2017-12-19 NOTE — Telephone Encounter (Signed)
I contacted the patient and transferred to Dr. Leta Baptist to discuss further.  MB RN.

## 2017-12-19 NOTE — Progress Notes (Signed)
I contacted the patient and transferred to Dr. Leta Baptist to discuss.

## 2017-12-19 NOTE — Progress Notes (Signed)
I am ordering urgent Neurosurgery consult. Please call patient and I can review by phone. -VRP

## 2017-12-19 NOTE — Telephone Encounter (Signed)
[facility]/GI [phone] request a call back regarding MRI cervical. Please call

## 2017-12-19 NOTE — Telephone Encounter (Signed)
Reviewed report and imaging. Will setup urgent (this week) referral to neurosurgery for evaluation. -VRP

## 2017-12-19 NOTE — Telephone Encounter (Signed)
I called patient with results. Symptomatic C3-4 cervical spinal stenosis (severe). Also with moderate lumbar spinal stenosis (moderate). Recommend urgent neurosurgery consult. I have placed consult. Will send in gabapentin for nerve pain and muscle spasms.   Meds ordered this encounter  Medications  . gabapentin (NEURONTIN) 300 MG capsule    Sig: Take 1 capsule (300 mg total) by mouth 2 (two) times daily.    Dispense:  60 capsule    Refill:  Coleman, MD 4/72/0721, 8:28 PM Certified in Neurology, Neurophysiology and Neuroimaging  [facility] [address] [place], [place] [address] [phone]

## 2017-12-20 ENCOUNTER — Ambulatory Visit (INDEPENDENT_AMBULATORY_CARE_PROVIDER_SITE_OTHER): Payer: Medicare Other | Admitting: Internal Medicine

## 2017-12-20 ENCOUNTER — Encounter: Payer: Self-pay | Admitting: Internal Medicine

## 2017-12-20 VITALS — BP 104/78 | HR 110 | Temp 98.0°F | Ht 72.0 in | Wt 239.0 lb

## 2017-12-20 DIAGNOSIS — M4802 Spinal stenosis, cervical region: Secondary | ICD-10-CM | POA: Diagnosis not present

## 2017-12-20 DIAGNOSIS — M4712 Other spondylosis with myelopathy, cervical region: Secondary | ICD-10-CM | POA: Diagnosis not present

## 2017-12-20 DIAGNOSIS — F39 Unspecified mood [affective] disorder: Secondary | ICD-10-CM

## 2017-12-20 DIAGNOSIS — G959 Disease of spinal cord, unspecified: Secondary | ICD-10-CM | POA: Diagnosis not present

## 2017-12-20 MED ORDER — LORAZEPAM 0.5 MG PO TABS: 0.2500 mg | ORAL_TABLET | Freq: Two times a day (BID) | ORAL | 0 refills | Status: DC | PRN

## 2017-12-20 NOTE — Assessment & Plan Note (Signed)
Clearly seems to be the reason for all his symptoms Will be seeing neurosurgeon soon and expect surgery on semi-emergent basis Discussed this and answered their questions----the entire 15 minute visit

## 2017-12-20 NOTE — Assessment & Plan Note (Signed)
Very anxious with all that is going on Really affecting him Discussed options---he would like a small amount of a tranquilizer for prn use

## 2017-12-20 NOTE — Progress Notes (Signed)
Subjective:    Patient ID: Evan Giles, male    DOB: 02-27-1941, 77 y.o.   MRN: [id number]  HPI Here for follow up of his ataxia Wife is here  Reviewed neurology evaluation MRI of cervical spine did show cord compression and seems to be the reason for his symptoms He feels his symptoms are worse  Got some gabapentin--helps pain but sedating  Has been set up with neurosurgeon  Current Outpatient Medications on File Prior to Visit  Medication Sig Dispense Refill  . acetaminophen (TYLENOL) 500 MG tablet Take 1,000 mg by mouth daily as needed for moderate pain or headache.    . ALPRAZolam (XANAX) 0.5 MG tablet Take 1 tablet (0.5 mg total) by mouth as needed for anxiety (for sedation before MRI scan; take 1 hour before scan; may repeat 15 min before scan). 3 tablet 0  . aspirin 81 MG tablet Take 81 mg by mouth daily.    Marland Kitchen atorvastatin (LIPITOR) 20 MG tablet TAKE ONE TABLET EVERY DAY 90 tablet 3  . Calcium Citrate-Vitamin D (CALCIUM + D PO) Take 1 tablet by mouth every morning.    . diphenhydrAMINE HCl, Sleep, (UNISOM SLEEPGELS) 50 MG CAPS Take 50 mg by mouth at bedtime as needed (UNISOM).    . gabapentin (NEURONTIN) 300 MG capsule Take 1 capsule (300 mg total) by mouth 2 (two) times daily. 60 capsule 6  . HYDROcodone-acetaminophen (NORCO) 5-325 MG tablet Take 1-2 tablets by mouth every 4 (four) hours as needed for moderate pain. 20 tablet 0  . ibuprofen (ADVIL,MOTRIN) 200 MG tablet Take 400 mg by mouth daily as needed for headache or moderate pain.    Marland Kitchen lisinopril-hydrochlorothiazide (PRINZIDE,ZESTORETIC) 10-12.5 MG tablet Take 1 tablet by mouth daily.    . Magnesium Oxide (MAG-OX PO) Take 250 mg by mouth daily.    Marland Kitchen OVER THE COUNTER MEDICATION Take 1 tablet by mouth every evening. Super Beta Prostate      No current facility-administered medications on file prior to visit.     Allergies  Allergen Reactions  . Hydrocodone Itching    Past Medical History:  Diagnosis Date    . Hyperlipidemia   . Hypertension   . Osteoarthrosis of knee   . Peptic ulcer    years ago    Past Surgical History:  Procedure Laterality Date  . CATARACT EXTRACTION W/ INTRAOCULAR LENS IMPLANT  8/12  . dental implant  08/2015  . JOINT REPLACEMENT Left 06/2011   Left knee---Dr Hooten  . KNEE ARTHROSCOPY Right 09/07/2015   Procedure: ARTHROSCOPY KNEE, PARTIAL MEDIAL AND LATERAL MENISECTOMY, chondroplasty of patella femoral.;  Surgeon: Dereck Leep, MD;  Location: [facility] OR[facility];  Service: Orthopedics;  Laterality: Right;  . KNEE ARTHROSCOPY Right 09/25/2017   Procedure: ARTHROSCOPY KNEE;  Surgeon: Dereck Leep, MD;  Location: [facility] OR[facility];  Service: Orthopedics;  Laterality: Right;  . KNEE ARTHROSCOPY WITH MEDIAL MENISECTOMY Right 09/25/2017   Procedure: KNEE ARTHROSCOPY WITH MEDIAL MENISECTOMY;  Surgeon: Dereck Leep, MD;  Location: [facility] OR[facility];  Service: Orthopedics;  Laterality: Right;  . MENISCUS DEBRIDEMENT Left 05/2010   Dr Roberts Gaudy  . RETINAL LASER PROCEDURE  4/12   Dr Darrick Grinder  . TONSILLECTOMY AND ADENOIDECTOMY  1966    Family History  Problem Relation Age of Onset  . Heart disease Father   . Stroke Father        not certain about this  . Cancer Sister        unknown  . Diabetes Maternal  Grandmother   . Obesity Sister     Social History   Socioeconomic History  . Marital status: Married    Spouse name: [name]  . Number of children: 2  . Years of education: 78  . Highest education level: Not on file  Occupational History  . Occupation: Engineer, drilling    Comment: mostly Medicare advantage  Social Needs  . Financial resource strain: Not on file  . Food insecurity:    Worry: Not on file    Inability: Not on file  . Transportation needs:    Medical: Not on file    Non-medical: Not on file  Tobacco Use  . Smoking status: Never Smoker  . Smokeless tobacco: Never Used  Substance and Sexual Activity  . Alcohol use: No    Alcohol/week: 0.0 standard  drinks  . Drug use: No  . Sexual activity: Not on file  Lifestyle  . Physical activity:    Days per week: Not on file    Minutes per session: Not on file  . Stress: Not on file  Relationships  . Social connections:    Talks on phone: Not on file    Gets together: Not on file    Attends religious service: Not on file    Active member of club or organization: Not on file    Attends meetings of clubs or organizations: Not on file    Relationship status: Not on file  . Intimate partner violence:    Fear of current or ex partner: Not on file    Emotionally abused: Not on file    Physically abused: Not on file    Forced sexual activity: Not on file  Other Topics Concern  . Not on file  Social History Narrative   I son and 1 daughter   Has living will   Wife is health care POA --- alternate would be son   Would accept resuscitation---but no prolonged ventilation   Probably would accept feeding tube--at least for a limited time   Lives with wife   Coffee, 3 cups daily   Review of Systems Some restless legs symptoms Ongoing trouble voiding---trouble sensing that he is going and delayed stream    Objective:   Physical Exam  Constitutional: He appears well-developed. No distress.           Assessment & Plan:

## 2017-12-21 ENCOUNTER — Other Ambulatory Visit: Payer: Self-pay

## 2017-12-21 ENCOUNTER — Emergency Department
Admission: EM | Admit: 2017-12-21 | Discharge: 2017-12-21 | Disposition: A | Discharge status: Home / Self Care | Payer: Medicare Other | Attending: Emergency Medicine | Admitting: Emergency Medicine

## 2017-12-21 ENCOUNTER — Emergency Department: Payer: Medicare Other

## 2017-12-21 ENCOUNTER — Telehealth: Payer: Self-pay | Admitting: Diagnostic Neuroimaging

## 2017-12-21 DIAGNOSIS — S199XXA Unspecified injury of neck, initial encounter: Secondary | ICD-10-CM | POA: Diagnosis present

## 2017-12-21 DIAGNOSIS — S0990XA Unspecified injury of head, initial encounter: Secondary | ICD-10-CM | POA: Diagnosis not present

## 2017-12-21 DIAGNOSIS — I10 Essential (primary) hypertension: Secondary | ICD-10-CM | POA: Diagnosis not present

## 2017-12-21 DIAGNOSIS — Z7982 Long term (current) use of aspirin: Secondary | ICD-10-CM | POA: Insufficient documentation

## 2017-12-21 DIAGNOSIS — R51 Headache: Secondary | ICD-10-CM | POA: Diagnosis not present

## 2017-12-21 DIAGNOSIS — Y9389 Activity, other specified: Secondary | ICD-10-CM | POA: Insufficient documentation

## 2017-12-21 DIAGNOSIS — Y999 Unspecified external cause status: Secondary | ICD-10-CM | POA: Insufficient documentation

## 2017-12-21 DIAGNOSIS — Z79899 Other long term (current) drug therapy: Secondary | ICD-10-CM | POA: Insufficient documentation

## 2017-12-21 DIAGNOSIS — R7303 Prediabetes: Secondary | ICD-10-CM | POA: Diagnosis not present

## 2017-12-21 DIAGNOSIS — S161XXA Strain of muscle, fascia and tendon at neck level, initial encounter: Secondary | ICD-10-CM | POA: Diagnosis not present

## 2017-12-21 DIAGNOSIS — M4802 Spinal stenosis, cervical region: Secondary | ICD-10-CM | POA: Diagnosis not present

## 2017-12-21 DIAGNOSIS — R531 Weakness: Secondary | ICD-10-CM | POA: Diagnosis not present

## 2017-12-21 DIAGNOSIS — Y929 Unspecified place or not applicable: Secondary | ICD-10-CM | POA: Diagnosis not present

## 2017-12-21 DIAGNOSIS — S1982XA Other specified injuries of cervical trachea, initial encounter: Secondary | ICD-10-CM | POA: Diagnosis not present

## 2017-12-21 MED ORDER — PREDNISONE 50 MG PO TABS: 50.0000 mg | ORAL_TABLET | Freq: Every day | ORAL | 0 refills | Status: DC

## 2017-12-21 MED ORDER — METHOCARBAMOL 500 MG PO TABS: 500.0000 mg | ORAL_TABLET | Freq: Four times a day (QID) | ORAL | 0 refills | Status: DC
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
## 2017-12-21 NOTE — Telephone Encounter (Signed)
Pt appt with Dr Arnoldo Morale [facility] Neurosurgery 8/28 @ 2:30

## 2017-12-22 ENCOUNTER — Telehealth: Payer: Self-pay

## 2017-12-22 NOTE — Telephone Encounter (Signed)
Patient called and states he is mostly feeling pretty good. He does have a little pain on the left side of his neck. He states he called his neurosurgeon Dr. Arnoldo Morale and left a message with the nurse . Patient states he is doing fine. He states the muscle relaxer is helping . If you have any questions please call back. CB# [phone]
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
## 2017-12-27 DIAGNOSIS — M4712 Other spondylosis with myelopathy, cervical region: Secondary | ICD-10-CM | POA: Diagnosis not present
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
## 2018-01-03 ENCOUNTER — Other Ambulatory Visit: Payer: Self-pay

## 2018-01-03 ENCOUNTER — Other Ambulatory Visit: Payer: Self-pay | Admitting: Internal Medicine

## 2018-01-03 NOTE — Telephone Encounter (Signed)
Refill of Ativan  LOV 12/20/17 Dr. Brooke Bonito  [facility] 12/20/17  #20  0 refills  Refill of prednisone by Historical Provider  [facility] 12/21/17   #5   0 refills

## 2018-01-03 NOTE — Patient Outreach (Signed)
[facility]) Care Management  01/03/2018  LEEVON UPPERMAN 07-Mar-1941 [id number]   Medication Adherence call to Mr. Antoney Biven spoke with patient he said he pick up both medication from [place] and wont be due until next month patient is due on Losartan/ Hctz 100/25 and Atorvastatin 20 mg. Mr. Gastelum is showing past due under [place].  [facility] Direct Dial [phone]  Fax [phone] [email or web address]

## 2018-01-03 NOTE — Telephone Encounter (Signed)
Copied from [place] [id number]. Topic: Quick Communication - Rx Refill/Question >> Jan 03, 2018 12:08 PM Reyne Dumas L wrote: Medication:  LORazepam (ATIVAN) 0.5 MG tablet predniSONE (DELTASONE) 50 MG tablet  Has the patient contacted their pharmacy? No - they will, but wanted to go ahead and leave message. (Agent: If no, request that the patient contact the pharmacy for the refill.) (Agent: If yes, when and what did the pharmacy advise?)  Preferred Pharmacy (with phone number or street name): [place], [place] - [place] [phone] (Phone) [phone] (Fax)  Agent: Please be advised that RX refills may take up to 3 business days. We ask that you follow-up with your pharmacy.

## 2018-01-03 NOTE — Telephone Encounter (Signed)
Name of Medication: lorazepam 0.5 mg  [facility] Last Fill or Written Date and Quantity: # 20 on 12/20/17 Last Office Visit and Type: 12/20/17 FU ataxia Next Office Visit and Type: 04/27/18 CPX    Name of Medication: prednisone 50 mg  [facility] Last Fill or Written Date and Quantity: # 5 on 12/21/17 last prescribed by [facility] ED PA Last Office Visit and Type: 12/20/17 FU Ataxia Next Office Visit and Type: 04/27/18 CPX

## 2018-01-04 ENCOUNTER — Other Ambulatory Visit: Payer: Self-pay | Admitting: Internal Medicine

## 2018-01-04 MED ORDER — LORAZEPAM 0.5 MG PO TABS: 0.2500 mg | ORAL_TABLET | Freq: Two times a day (BID) | ORAL | 0 refills | Status: DC | PRN

## 2018-01-04 NOTE — Telephone Encounter (Signed)
As far as I know, the prednisone was a one time treatment so should not be refilled (confirm if he was asking for it and whether something has changed with his neck issue)

## 2018-01-05 NOTE — Telephone Encounter (Signed)
Spoke to pt. He said he has been having issues with urination at times and the prednisone seemed to help that. I explained that prednisone may not be the right medication for that issue and he would need to schedule an appointment.

## 2018-01-09 DIAGNOSIS — R3914 Feeling of incomplete bladder emptying: Secondary | ICD-10-CM | POA: Diagnosis not present

## 2018-01-10 DIAGNOSIS — M4802 Spinal stenosis, cervical region: Secondary | ICD-10-CM | POA: Diagnosis not present

## 2018-01-10 DIAGNOSIS — M5001 Cervical disc disorder with myelopathy,  high cervical region: Secondary | ICD-10-CM | POA: Diagnosis not present

## 2018-01-10 DIAGNOSIS — M4712 Other spondylosis with myelopathy, cervical region: Secondary | ICD-10-CM | POA: Diagnosis not present

## 2018-01-25 ENCOUNTER — Other Ambulatory Visit: Payer: Self-pay | Admitting: Internal Medicine

## 2018-01-25 MED ORDER — LORAZEPAM 0.5 MG PO TABS: 0.2500 mg | ORAL_TABLET | Freq: Two times a day (BID) | ORAL | 0 refills | Status: DC | PRN

## 2018-01-25 NOTE — Telephone Encounter (Signed)
Name of Medication: Lorazepam 0.5 mg [facility] of [facility] Last Fill or Written Date and Quantity: # 30 on 01/04/18 Last Office Visit and Type: 12/20/17 Next Office Visit and Type: 04/27/18 CPX Last Controlled Substance Agreement Date: none Last [id number]:[id number]

## 2018-01-25 NOTE — Telephone Encounter (Signed)
Copied from [place] [id number]. Topic: Quick Communication - Rx Refill/Question >> Jan 25, 2018 11:37 AM Scherrie Gerlach wrote: Medication:  LORazepam (ATIVAN) 0.5 MG tablet Wife states pt is still experiencing anxiety from his spine surgery. Wife states pt needs to continue this med. [place], [place] - [place] [phone] (Phone) [phone] (Fax)

## 2018-02-02 ENCOUNTER — Other Ambulatory Visit: Payer: Self-pay | Admitting: Internal Medicine

## 2018-02-05 NOTE — Telephone Encounter (Signed)
Last office visit 12/20/2017 for Cervical Myelopathy. CPE scheduled for 04/27/2018.  No UDS/Contract.   Last refilled 12/14/2017 for #30 with no refills.

## 2018-02-06 NOTE — Telephone Encounter (Signed)
Forwarding to Dr G in Dr Letvak's absence 

## 2018-02-06 NOTE — Telephone Encounter (Signed)
Eprescribed.

## 2018-02-09 DIAGNOSIS — M4802 Spinal stenosis, cervical region: Secondary | ICD-10-CM | POA: Diagnosis not present

## 2018-02-19 ENCOUNTER — Ambulatory Visit: Payer: Self-pay | Admitting: *Deleted

## 2018-02-19 ENCOUNTER — Ambulatory Visit (INDEPENDENT_AMBULATORY_CARE_PROVIDER_SITE_OTHER): Payer: Medicare Other | Admitting: Family Medicine

## 2018-02-19 ENCOUNTER — Encounter: Payer: Self-pay | Admitting: Family Medicine

## 2018-02-19 VITALS — BP 140/74 | HR 78 | Temp 97.6°F | Wt 218.0 lb

## 2018-02-19 DIAGNOSIS — F418 Other specified anxiety disorders: Secondary | ICD-10-CM | POA: Diagnosis not present

## 2018-02-19 DIAGNOSIS — R634 Abnormal weight loss: Secondary | ICD-10-CM | POA: Diagnosis not present

## 2018-02-19 DIAGNOSIS — R42 Dizziness and giddiness: Secondary | ICD-10-CM

## 2018-02-19 LAB — POC URINALSYSI DIPSTICK (AUTOMATED)
BILIRUBIN UA: NEGATIVE
Glucose, UA: NEGATIVE
Ketones, UA: POSITIVE
LEUKOCYTES UA: NEGATIVE
NITRITE UA: NEGATIVE
Protein, UA: NEGATIVE
RBC UA: NEGATIVE
Spec Grav, UA: 1.01 (ref 1.010–1.025)
UROBILINOGEN UA: 0.2 U/dL
pH, UA: 6 (ref 5.0–8.0)

## 2018-02-19 MED ORDER — SERTRALINE HCL 50 MG PO TABS: 50.0000 mg | ORAL_TABLET | Freq: Every day | ORAL | 3 refills | Status: DC

## 2018-02-19 NOTE — Patient Instructions (Addendum)
Good to see you today  Please take your lorazepam 1/2 tablet every 8 hours for 5-7 days. If you are able to tolerate this, please decrease to every 12 hours for 5-7 days then once a day- may be better to take in morning since you are taking sertraline and gabapentin at night.   I have sent in a prescription for sertraline. Take 1/2 tablet at bedtime for 3 days then increase to 1 tablet  Drink enough water to make your urine light yellow, avoid liquids after dinner  Follow up in one month, sooner if worsening symptoms

## 2018-02-19 NOTE — Telephone Encounter (Signed)
The pt states that he woke up dizzy 3 times this morning; last episode was at 0615 and he remians dizzy; his BP 128/97 at 1030; at 1100 it was 120/88, 1130 112/69; 1145 131/76; his cbg was 141 at 1145; the pt states that his BP normally runs 140/80s; he also states he did not take his morning dose of  Lisinopril-hHtz; he says that he has previously had intermittent periods of dizziness; the pt states that he has lost 45 pounds over the past 2 months; he says that he has not taken gabapentin or lorazepam for a past few days; recommendations made per nurse triage protocol; the pt normally sees Dr Silvio Pate but he has no availability within guidelines; pt offered and accepted an appointment with Clarene Reamer, [place], 05/22/17 at 1400; he verba;ozed understanding; will route to office for notification of this upcoming appointment.  Reason for Disposition . [1] MODERATE dizziness (e.g., interferes with normal activities) AND [2] has NOT been evaluated by physician for this  (Exception: dizziness caused by heat exposure, sudden standing, or poor fluid intake)  Answer Assessment - Initial Assessment Questions 1. DESCRIPTION: "Describe your dizziness."     lightheaded 2. LIGHTHEADED: "Do you feel lightheaded?" (e.g., somewhat faint, woozy, weak upon standing)     All the time 3. VERTIGO: "Do you feel like either you or the room is spinning or tilting?" (i.e. vertigo)     no 4. SEVERITY: "How bad is it?"  "Do you feel like you are going to faint?" "Can you stand and walk?"   - MILD - walking normally   - MODERATE - interferes with normal activities (e.g., work, school)    - SEVERE - unable to stand, requires support to walk, feels like passing out now.      Mild to moderate; feels off; uses a walker 5. ONSET:  "When did the dizziness begin?"     Shortly after surgery on 01/10/18; last episode 02/19/18 at 0615 6. AGGRAVATING FACTORS: "Does anything make it worse?" (e.g., standing, change in head  position)     no 7. HEART RATE: "Can you tell me your heart rate?" "How many beats in 15 seconds?"  (Note: not all patients can do this)       Per BP machine at 1212 pulse is 85 8. CAUSE: "What do you think is causing the dizziness?"     originally thought was medication 9. RECURRENT SYMPTOM: "Have you had dizziness before?" If so, ask: "When was the last time?" "What happened that time?"     Last episode 02/19/18 at 0615 10. OTHER SYMPTOMS: "Do you have any other symptoms?" (e.g., fever, chest pain, vomiting, diarrhea, bleeding)       No; feels hot but no fever 11. PREGNANCY: "Is there any chance you are pregnant?" "When was your last menstrual period?"     n/a  Protocols used: DIZZINESS [place]

## 2018-02-19 NOTE — Progress Notes (Signed)
Subjective:    Patient ID: Evan Giles, male    DOB: 1941/02/06, 77 y.o.   MRN: [id number]  HPI This is a 77 yo male, accompanied by his wife, who presents today with complaint of "dizziness" that has been intermittent. Symptoms have been going on since his cervical surgery last month. Has been on gabapentin for two months. Feels lightheaded, no sensation of spinning. Episodes come and go. Stopped his lorazepam three days ago (was taking 1/4 of 0.5 mg every 6 hours). Sometimes feels like he is going to pass out, but not always with episodes. Notices with getting up and this am noticed before he got up out of bed. Feels like it is continuous, having 1 or 2 episodes throughout the day. Feels some improvement with taking lorazepam. Feels very anxious. No pain. Sleep is poor, 1-2 hours at a time. Has been poor since surgery. Mouth is dry, wakes up to go to the bathroom. No dysuria/frequency.   Had meniscus surgery on right knee, thinks he twisted it and maybe re tore it. Occasional pain, no swelling or instability.   Has had decreased appetite since surgery. Has lost 40 pounds.   Wife reports that patient was not anxious prior to surgery, he seems to be worrying more.   Past Medical History:  Diagnosis Date  . Hyperlipidemia   . Hypertension   . Osteoarthrosis of knee   . Peptic ulcer    years ago   Past Surgical History:  Procedure Laterality Date  . CATARACT EXTRACTION W/ INTRAOCULAR LENS IMPLANT  8/12  . dental implant  08/2015  . JOINT REPLACEMENT Left 06/2011   Left knee---Dr Hooten  . KNEE ARTHROSCOPY Right 09/07/2015   Procedure: ARTHROSCOPY KNEE, PARTIAL MEDIAL AND LATERAL MENISECTOMY, chondroplasty of patella femoral.;  Surgeon: Dereck Leep, MD;  Location: [facility];  Service: Orthopedics;  Laterality: Right;  . KNEE ARTHROSCOPY Right 09/25/2017   Procedure: ARTHROSCOPY KNEE;  Surgeon: Dereck Leep, MD;  Location: [facility];  Service: Orthopedics;  Laterality: Right;  .  KNEE ARTHROSCOPY WITH MEDIAL MENISECTOMY Right 09/25/2017   Procedure: KNEE ARTHROSCOPY WITH MEDIAL MENISECTOMY;  Surgeon: Dereck Leep, MD;  Location: [facility];  Service: Orthopedics;  Laterality: Right;  . MENISCUS DEBRIDEMENT Left 05/2010   Dr Roberts Gaudy  . RETINAL LASER PROCEDURE  4/12   Dr Darrick Grinder  . TONSILLECTOMY AND ADENOIDECTOMY  1966   Family History  Problem Relation Age of Onset  . Heart disease Father   . Stroke Father        not certain about this  . Cancer Sister        unknown  . Diabetes Maternal Grandmother   . Obesity Sister    Social History   Tobacco Use  . Smoking status: Never Smoker  . Smokeless tobacco: Never Used  Substance Use Topics  . Alcohol use: No    Alcohol/week: 0.0 standard drinks  . Drug use: No      Review of Systems     Objective:   Physical Exam    BP 140/74 (BP Location: Left Arm, Patient Position: Sitting, Cuff Size: Normal)   Pulse 78   Temp 97.6 F (36.4 C) (Oral)   SpO2 98%   Wt Readings from Last 3 Encounters:  02/19/18 218 lb (98.9 kg)  12/21/17 238 lb (108 kg)  12/20/17 239 lb (108.4 kg)  Additional weights- 12/18- 258 12/01/17- 249   Orthostatic Blood Pressure: Blood pressure:   lying 100/50, sitting 100/66,  standing 100/60 Pulse:   lying 72, sitting 58, standing 89 Standing after 3 minutes- 100/70, 89     Assessment & Plan:  1. Lightheadedness - likely multi factorial, discussed his risk factors for light headedness- age, orthopedic issues, medications, poor appetite - orthostatic vitals unremarkable, will check labs, encouraged him to drink adequate fluids - POCT Urinalysis Dipstick (Automated) - CBC with Differential - Comprehensive metabolic panel - TSH  2. Anxiety about health - per patient and his wife, these symptoms have started with recent health issues and he is worrying incessantly about his health, his recovery and potential side effects of medication - sertraline (ZOLOFT) 50 MG  tablet; Take 1 tablet (50 mg total) by mouth daily.  Dispense: 30 tablet; Refill: 3  3. Weight loss - CBC with Differential - Comprehensive metabolic panel - TSH  - Medications- provided instructions for slow decrease in lorazepam and instructed him to take gabapentin at bedtime since it was helping with his restless legs- ? May need a smaller dose - follow up in 1 months Clarene Reamer, FNP-BC  [facility], [place]  02/19/2018 2:51 PM

## 2018-02-19 NOTE — Telephone Encounter (Signed)
Noted  

## 2018-02-20 LAB — CBC WITH DIFFERENTIAL/PLATELET
BASOS PCT: 0.6 % (ref 0.0–3.0)
Basophils Absolute: 0.1 10*3/uL (ref 0.0–0.1)
EOS PCT: 1.2 % (ref 0.0–5.0)
Eosinophils Absolute: 0.1 10*3/uL (ref 0.0–0.7)
HCT: 41.2 % (ref 39.0–52.0)
HEMOGLOBIN: 13.8 g/dL (ref 13.0–17.0)
Lymphocytes Relative: 15.9 % (ref 12.0–46.0)
Lymphs Abs: 1.4 10*3/uL (ref 0.7–4.0)
MCHC: 33.5 g/dL (ref 30.0–36.0)
MCV: 86.5 fl (ref 78.0–100.0)
MONO ABS: 0.7 10*3/uL (ref 0.1–1.0)
Monocytes Relative: 8.1 % (ref 3.0–12.0)
NEUTROS ABS: 6.7 10*3/uL (ref 1.4–7.7)
Neutrophils Relative %: 74.2 % (ref 43.0–77.0)
Platelets: 288 10*3/uL (ref 150.0–400.0)
RBC: 4.77 Mil/uL (ref 4.22–5.81)
RDW: 14.7 % (ref 11.5–15.5)
WBC: 9.1 10*3/uL (ref 4.0–10.5)

## 2018-02-20 LAB — COMPREHENSIVE METABOLIC PANEL
ALK PHOS: 72 U/L (ref 39–117)
ALT: 9 U/L (ref 0–53)
AST: 15 U/L (ref 0–37)
Albumin: 4.3 g/dL (ref 3.5–5.2)
BUN: 12 mg/dL (ref 6–23)
CO2: 27 mEq/L (ref 19–32)
Calcium: 9.6 mg/dL (ref 8.4–10.5)
Chloride: 101 mEq/L (ref 96–112)
Creatinine, Ser: 0.76 mg/dL (ref 0.40–1.50)
GFR: 106.09 mL/min (ref >60.00)
GLUCOSE: 97 mg/dL (ref 70–99)
POTASSIUM: 3.9 meq/L (ref 3.5–5.1)
Sodium: 137 mEq/L (ref 135–145)
TOTAL PROTEIN: 6.9 g/dL (ref 6.0–8.3)
Total Bilirubin: 0.5 mg/dL (ref 0.2–1.2)

## 2018-02-20 LAB — TSH: TSH: 1.38 u[IU]/mL (ref 0.35–4.50)

## 2018-02-21 ENCOUNTER — Encounter: Payer: Self-pay | Admitting: Family Medicine

## 2018-02-22 ENCOUNTER — Encounter: Payer: Self-pay | Admitting: Family Medicine

## 2018-02-23 ENCOUNTER — Encounter: Payer: Self-pay | Admitting: Family Medicine

## 2018-02-26 ENCOUNTER — Encounter: Payer: Self-pay | Admitting: Family Medicine

## 2018-02-28 ENCOUNTER — Encounter: Payer: Self-pay | Admitting: Family Medicine

## 2018-03-01 ENCOUNTER — Other Ambulatory Visit: Payer: Self-pay | Admitting: Family Medicine

## 2018-03-01 DIAGNOSIS — G2581 Restless legs syndrome: Secondary | ICD-10-CM

## 2018-03-01 MED ORDER — GABAPENTIN 100 MG PO CAPS: 100.0000 mg | ORAL_CAPSULE | Freq: Every day | ORAL | 1 refills | Status: DC

## 2018-03-12 ENCOUNTER — Telehealth: Payer: Self-pay | Admitting: Diagnostic Neuroimaging

## 2018-03-12 NOTE — Telephone Encounter (Signed)
Pt and pt wife Susan(on DPR)called requesting a call back stating they would like to discuss the next steps, stating the pt has had his surgery done. Please call when available

## 2018-03-13 NOTE — Telephone Encounter (Signed)
pls call pt to clarify. -VRP

## 2018-03-13 NOTE — Telephone Encounter (Signed)
LMVM for pt that returning call.   Will try again tomorrow.

## 2018-03-14 NOTE — Telephone Encounter (Signed)
Pt called back.  He stated surgery has gone well since 12-1917.  He has had light headedness/ dizziness,not vertigo, and rubbery leg feeling.  He has been to pcp, Dr. Carlean Purl in [place] 02/19/2018 for this, pt noted not Bppv.    (asking to see [place]).  They thought it might be medication related, and took gabapentin from  300mg  bid po to 100-200 mg po daily, no difference.  Could not take zoloft for anxiety.  Mentioned to NS when in for f/u and did not really address.  Would you see?  Please advise.

## 2018-03-14 NOTE — Telephone Encounter (Signed)
Leg issue likely related to cervical spine compression.  Lightheadedness should be evaluated by PCP.   -VRP

## 2018-03-15 NOTE — Telephone Encounter (Signed)
I spoke to pt and relayed that the rubbery leg feeling would mostly likely be from cervical spine compression, if continues to touch base with NS, and the lightheadedness would be further evaluated by pcp.  If they thought needed neurologist then to refer back to [place].  He verbalized understanding.

## 2018-03-26 ENCOUNTER — Ambulatory Visit (INDEPENDENT_AMBULATORY_CARE_PROVIDER_SITE_OTHER): Payer: Medicare Other | Admitting: Family Medicine

## 2018-03-26 ENCOUNTER — Encounter: Payer: Self-pay | Admitting: Family Medicine

## 2018-03-26 VITALS — BP 132/78 | HR 76 | Temp 98.0°F | Ht 72.0 in | Wt 223.0 lb

## 2018-03-26 DIAGNOSIS — R300 Dysuria: Secondary | ICD-10-CM | POA: Diagnosis not present

## 2018-03-26 DIAGNOSIS — I10 Essential (primary) hypertension: Secondary | ICD-10-CM | POA: Diagnosis not present

## 2018-03-26 DIAGNOSIS — F39 Unspecified mood [affective] disorder: Secondary | ICD-10-CM

## 2018-03-26 DIAGNOSIS — Z23 Encounter for immunization: Secondary | ICD-10-CM

## 2018-03-26 LAB — POC URINALSYSI DIPSTICK (AUTOMATED)
Bilirubin, UA: NEGATIVE
Glucose, UA: NEGATIVE
Ketones, UA: NEGATIVE
Leukocytes, UA: NEGATIVE
Nitrite, UA: NEGATIVE
PROTEIN UA: NEGATIVE
RBC UA: NEGATIVE
SPEC GRAV UA: 1.025 (ref 1.010–1.025)
UROBILINOGEN UA: 0.2 U/dL
pH, UA: 6 (ref 5.0–8.0)

## 2018-03-26 NOTE — Patient Instructions (Signed)
Good to see you today  Your urine does not show infection, blood or protein; follow up with urology

## 2018-03-26 NOTE — Progress Notes (Signed)
Subjective:    Patient ID: Evan Giles, male    DOB: 1941/02/01, 77 y.o.   MRN: [id number]  HPI This is a 77 yo male, accompanied by his wife, who presents today for follow up of anxiety, insomnia, lightheadedness.  Symptoms have improved since coming off gabapentin, sertraline. Taking diphenhydramine for sleep.  Having to get up to urinate, but little urine. Mild dysuria x 1 week. Following up with urology.  Restless legs improved, has been using seated cycle.  Mood improved, feels like getting out more, appetite improved.   Past Medical History:  Diagnosis Date  . Hyperlipidemia   . Hypertension   . Osteoarthrosis of knee   . Peptic ulcer    years ago   Past Surgical History:  Procedure Laterality Date  . CATARACT EXTRACTION W/ INTRAOCULAR LENS IMPLANT  8/12  . dental implant  08/2015  . JOINT REPLACEMENT Left 06/2011   Left knee---Dr Hooten  . KNEE ARTHROSCOPY Right 09/07/2015   Procedure: ARTHROSCOPY KNEE, PARTIAL MEDIAL AND LATERAL MENISECTOMY, chondroplasty of patella femoral.;  Surgeon: Dereck Leep, MD;  Location: [facility];  Service: Orthopedics;  Laterality: Right;  . KNEE ARTHROSCOPY Right 09/25/2017   Procedure: ARTHROSCOPY KNEE;  Surgeon: Dereck Leep, MD;  Location: [facility];  Service: Orthopedics;  Laterality: Right;  . KNEE ARTHROSCOPY WITH MEDIAL MENISECTOMY Right 09/25/2017   Procedure: KNEE ARTHROSCOPY WITH MEDIAL MENISECTOMY;  Surgeon: Dereck Leep, MD;  Location: [facility];  Service: Orthopedics;  Laterality: Right;  . MENISCUS DEBRIDEMENT Left 05/2010   Dr Roberts Gaudy  . RETINAL LASER PROCEDURE  4/12   Dr Darrick Grinder  . TONSILLECTOMY AND ADENOIDECTOMY  1966   Family History  Problem Relation Age of Onset  . Heart disease Father   . Stroke Father        not certain about this  . Cancer Sister        unknown  . Diabetes Maternal Grandmother   . Obesity Sister    Social History   Tobacco Use  . Smoking status: Never Smoker  . Smokeless  tobacco: Never Used  Substance Use Topics  . Alcohol use: No    Alcohol/week: 0.0 standard drinks  . Drug use: No      Review of Systems Per HPI    Objective:   Physical Exam    BP 132/78 (BP Location: Left Arm, Patient Position: Sitting, Cuff Size: Large)   Pulse 76   Temp 98 F (36.7 C) (Oral)   Ht 6' (1.829 m)   Wt 223 lb (101.2 kg)   SpO2 97%   BMI 30.24 kg/m  Wt Readings from Last 3 Encounters:  03/26/18 223 lb (101.2 kg)  02/19/18 218 lb (98.9 kg)  12/21/17 238 lb (108 kg)   Results for orders placed or performed in visit on 03/26/18  POCT Urinalysis Dipstick (Automated)  Result Value Ref Range   Color, UA yellow    Clarity, UA clear    Glucose, UA Negative Negative   Bilirubin, UA neg    Ketones, UA neg    Spec Grav, UA 1.025 1.010 - 1.025   Blood, UA neg    pH, UA 6.0 5.0 - 8.0   Protein, UA Negative Negative   Urobilinogen, UA 0.2 0.2 or 1.0 E.U./dL   Nitrite, UA neg    Leukocytes, UA Negative Negative        Assessment & Plan:  1. Dysuria - urine looks good, SG a little high, encouraged him to  increase fluids, follow up as scheduled with urology - POCT Urinalysis Dipstick (Automated)  2. Need for influenza vaccination - Flu vaccine HIGH DOSE PF (Fluzone High dose)  3. Mood disorder ([place]) - improved, likely related to recent surgery and recovery  4. Essential hypertension - well controlled  - he has follow up on file with PCP for next month   Clarene Reamer, FNP-BC  [facility] at [facility], [place]  03/26/2018 11:39 AM

## 2018-04-11 DIAGNOSIS — M5136 Other intervertebral disc degeneration, lumbar region: Secondary | ICD-10-CM | POA: Diagnosis not present

## 2018-04-11 DIAGNOSIS — M9901 Segmental and somatic dysfunction of cervical region: Secondary | ICD-10-CM | POA: Diagnosis not present

## 2018-04-11 DIAGNOSIS — M9902 Segmental and somatic dysfunction of thoracic region: Secondary | ICD-10-CM | POA: Diagnosis not present

## 2018-04-11 DIAGNOSIS — M9903 Segmental and somatic dysfunction of lumbar region: Secondary | ICD-10-CM | POA: Diagnosis not present

## 2018-04-12 DIAGNOSIS — R3914 Feeling of incomplete bladder emptying: Secondary | ICD-10-CM | POA: Diagnosis not present

## 2018-04-13 DIAGNOSIS — L400 Psoriasis vulgaris: Secondary | ICD-10-CM | POA: Diagnosis not present

## 2018-04-23 ENCOUNTER — Other Ambulatory Visit: Payer: Self-pay | Admitting: Ophthalmology

## 2018-04-23 DIAGNOSIS — G43509 Persistent migraine aura without cerebral infarction, not intractable, without status migrainosus: Secondary | ICD-10-CM

## 2018-04-27 ENCOUNTER — Encounter: Payer: Medicare Other | Admitting: Internal Medicine

## 2018-05-01 ENCOUNTER — Ambulatory Visit
Admission: RE | Admit: 2018-05-01 | Discharge: 2018-05-01 | Disposition: A | Discharge status: Home / Self Care | Payer: Medicare Other | Source: Ambulatory Visit | Attending: Ophthalmology | Admitting: Ophthalmology

## 2018-05-01 DIAGNOSIS — G43509 Persistent migraine aura without cerebral infarction, not intractable, without status migrainosus: Secondary | ICD-10-CM | POA: Diagnosis not present

## 2018-05-01 DIAGNOSIS — R42 Dizziness and giddiness: Secondary | ICD-10-CM | POA: Diagnosis not present

## 2018-05-01 LAB — POCT I-STAT CREATININE: Creatinine, Ser: 0.9 mg/dL (ref 0.61–1.24)

## 2018-05-01 MED ORDER — GADOBUTROL 1 MMOL/ML IV SOLN
10.0000 mL | Freq: Once | INTRAVENOUS | Status: AC | PRN
  Administered 2018-05-01: 10 mL via INTRAVENOUS

## 2018-05-03 DIAGNOSIS — R3914 Feeling of incomplete bladder emptying: Secondary | ICD-10-CM | POA: Diagnosis not present

## 2018-05-07 ENCOUNTER — Ambulatory Visit: Payer: Self-pay | Admitting: General Surgery

## 2018-05-07 DIAGNOSIS — K4090 Unilateral inguinal hernia, without obstruction or gangrene, not specified as recurrent: Secondary | ICD-10-CM | POA: Diagnosis not present

## 2018-05-10 DIAGNOSIS — R3914 Feeling of incomplete bladder emptying: Secondary | ICD-10-CM | POA: Diagnosis not present

## 2018-05-15 ENCOUNTER — Other Ambulatory Visit: Payer: Self-pay | Admitting: Physician Assistant

## 2018-05-15 DIAGNOSIS — H903 Sensorineural hearing loss, bilateral: Secondary | ICD-10-CM | POA: Diagnosis not present

## 2018-05-15 DIAGNOSIS — R42 Dizziness and giddiness: Secondary | ICD-10-CM | POA: Diagnosis not present

## 2018-05-15 DIAGNOSIS — I10 Essential (primary) hypertension: Secondary | ICD-10-CM | POA: Diagnosis not present

## 2018-05-15 DIAGNOSIS — H9041 Sensorineural hearing loss, unilateral, right ear, with unrestricted hearing on the contralateral side: Secondary | ICD-10-CM

## 2018-05-15 DIAGNOSIS — IMO0001 Reserved for inherently not codable concepts without codable children: Secondary | ICD-10-CM

## 2018-05-16 ENCOUNTER — Other Ambulatory Visit: Payer: Self-pay

## 2018-05-16 ENCOUNTER — Encounter
Admission: RE | Admit: 2018-05-16 | Discharge: 2018-05-16 | Disposition: A | Discharge status: Home / Self Care | Payer: Medicare Other | Source: Ambulatory Visit | Attending: General Surgery | Admitting: General Surgery

## 2018-05-16 DIAGNOSIS — I10 Essential (primary) hypertension: Secondary | ICD-10-CM | POA: Insufficient documentation

## 2018-05-16 DIAGNOSIS — Z01818 Encounter for other preprocedural examination: Secondary | ICD-10-CM | POA: Insufficient documentation

## 2018-05-16 LAB — URINALYSIS, ROUTINE W REFLEX MICROSCOPIC
Bilirubin Urine: NEGATIVE
GLUCOSE, UA: NEGATIVE mg/dL
HGB URINE DIPSTICK: NEGATIVE
Ketones, ur: NEGATIVE mg/dL
Leukocytes, UA: NEGATIVE
Nitrite: NEGATIVE
PH: 6 (ref 5.0–8.0)
Protein, ur: NEGATIVE mg/dL
SPECIFIC GRAVITY, URINE: 1.023 (ref 1.005–1.030)

## 2018-05-16 NOTE — Pre-Procedure Instructions (Addendum)
Addendum: Patient was able to provide urine for UA.   Patient was unable to provide urine for UA. Instructed to take collection supplies home. He understands to drop of urine sample at PAT office this week.

## 2018-05-16 NOTE — Patient Instructions (Signed)
Your procedure is scheduled on: Wednesday 05/23/18.  Report to DAY SURGERY DEPARTMENT LOCATED ON 2ND FLOOR MEDICAL MALL ENTRANCE. To find out your arrival time please call [phone] between 1PM - 3PM on Tuesday 05/22/18.   Remember: Instructions that are not followed completely may result in serious medical risk, up to and including death, or upon the discretion of your surgeon and anesthesiologist your surgery may need to be rescheduled.      _X__ 1. Do not eat food after midnight the night before your procedure.                 No gum chewing or hard candies. You may drink clear liquids up to 2 hours                 before you are scheduled to arrive for your surgery- DO NOT drink clear                 liquids within 2 hours of the start of your surgery.                 Clear Liquids include:  water, apple juice without pulp, clear carbohydrate                 drink such as Clearfast or Gatorade, Black Coffee or Tea (Do not add                 anything to coffee or tea).   __X__2.  On the morning of surgery brush your teeth with toothpaste and water, you may rinse your mouth with mouthwash if you wish.  Do not swallow any toothpaste or mouthwash.    __X__6.  Notify your doctor if there is any change in your medical condition      (cold, fever, infections).      Do not wear jewelry, make-up, hairpins, clips or nail polish. Do not wear lotions, powders, or perfumes.  Do not shave 48 hours prior to surgery. Men may shave face and neck. Do not bring valuables to the hospital.    [facility] is not responsible for any belongings or valuables.  Contacts, dentures/partials or body piercings may not be worn into surgery. Bring a case for your contacts, glasses or hearing aids, a denture cup will be supplied.    Patients discharged the day of surgery will not be allowed to drive home.     Please read over the following fact sheets that you were given:   MRSA  Information    __X__ Take these medicines the morning of surgery with A SIP OF WATER:     1. atorvastatin (LIPITOR) 20 MG tablet     __X__ Use CHG Soap as directed     __X__ Stop Blood Thinners Coumadin/Plavix/Xarelto/Pleta/Pradaxa/Eliquis/Effient/Aspirin    __X__ Stop Anti-inflammatories 7 days before surgery (last dose today) such as Advil, Ibuprofen, Motrin, BC or Goodies Powder, Naprosyn, Naproxen, Aleve, Aspirin, Meloxicam. May take Tylenol if needed for pain or discomfort.    __X__ Stop taking Magnesium 250 MG TABS today until after your surgery. Do not begin taking any new herbal supplements before your surgery.

## 2018-05-17 DIAGNOSIS — M9903 Segmental and somatic dysfunction of lumbar region: Secondary | ICD-10-CM | POA: Diagnosis not present

## 2018-05-17 DIAGNOSIS — M9901 Segmental and somatic dysfunction of cervical region: Secondary | ICD-10-CM | POA: Diagnosis not present

## 2018-05-17 DIAGNOSIS — M5136 Other intervertebral disc degeneration, lumbar region: Secondary | ICD-10-CM | POA: Diagnosis not present

## 2018-05-17 DIAGNOSIS — M9902 Segmental and somatic dysfunction of thoracic region: Secondary | ICD-10-CM | POA: Diagnosis not present

## 2018-05-18 DIAGNOSIS — M48062 Spinal stenosis, lumbar region with neurogenic claudication: Secondary | ICD-10-CM | POA: Diagnosis not present

## 2018-05-18 DIAGNOSIS — M4712 Other spondylosis with myelopathy, cervical region: Secondary | ICD-10-CM | POA: Diagnosis not present

## 2018-05-18 DIAGNOSIS — I10 Essential (primary) hypertension: Secondary | ICD-10-CM | POA: Diagnosis not present

## 2018-05-22 MED ORDER — CEFAZOLIN SODIUM-DEXTROSE 2-4 GM/100ML-% IV SOLN
2.0000 g | INTRAVENOUS | Status: AC
  Administered 2018-05-23: 2 g via INTRAVENOUS

## 2018-05-23 ENCOUNTER — Ambulatory Visit
Admission: RE | Admit: 2018-05-23 | Discharge: 2018-05-23 | Disposition: A | Discharge status: Home / Self Care | Payer: Medicare Other | Attending: General Surgery | Admitting: General Surgery

## 2018-05-23 ENCOUNTER — Ambulatory Visit: Payer: Medicare Other | Admitting: Anesthesiology

## 2018-05-23 ENCOUNTER — Encounter
Admission: RE | Disposition: A | Discharge status: Home / Self Care | Payer: Self-pay | Source: Home / Self Care | Attending: General Surgery

## 2018-05-23 DIAGNOSIS — Z885 Allergy status to narcotic agent status: Secondary | ICD-10-CM | POA: Insufficient documentation

## 2018-05-23 DIAGNOSIS — Z886 Allergy status to analgesic agent status: Secondary | ICD-10-CM | POA: Insufficient documentation

## 2018-05-23 DIAGNOSIS — K4090 Unilateral inguinal hernia, without obstruction or gangrene, not specified as recurrent: Secondary | ICD-10-CM | POA: Diagnosis not present

## 2018-05-23 DIAGNOSIS — I10 Essential (primary) hypertension: Secondary | ICD-10-CM | POA: Diagnosis not present

## 2018-05-23 DIAGNOSIS — E785 Hyperlipidemia, unspecified: Secondary | ICD-10-CM | POA: Insufficient documentation

## 2018-05-23 DIAGNOSIS — E7800 Pure hypercholesterolemia, unspecified: Secondary | ICD-10-CM | POA: Diagnosis not present

## 2018-05-23 DIAGNOSIS — Z7982 Long term (current) use of aspirin: Secondary | ICD-10-CM | POA: Diagnosis not present

## 2018-05-23 DIAGNOSIS — Z79899 Other long term (current) drug therapy: Secondary | ICD-10-CM | POA: Insufficient documentation

## 2018-05-23 HISTORY — PX: INGUINAL HERNIA REPAIR: SHX194

## 2018-05-23 SURGERY — REPAIR, HERNIA, INGUINAL, ADULT
Anesthesia: General | Site: Inguinal | Laterality: Left

## 2018-05-23 MED ORDER — OXYCODONE HCL 5 MG PO TABS: 5.0000 mg | ORAL_TABLET | Freq: Four times a day (QID) | ORAL | 0 refills | Status: DC | PRN

## 2018-05-23 MED ORDER — BUPIVACAINE-EPINEPHRINE (PF) 0.25% -1:200000 IJ SOLN
INTRAMUSCULAR | Status: AC
  Filled 2018-05-23: qty 30

## 2018-05-23 MED ORDER — FAMOTIDINE 20 MG PO TABS
ORAL_TABLET | ORAL | Status: AC
  Administered 2018-05-23: 20 mg via ORAL
  Filled 2018-05-23: qty 1

## 2018-05-23 MED ORDER — SUCCINYLCHOLINE CHLORIDE 20 MG/ML IJ SOLN
INTRAMUSCULAR | Status: DC | PRN
  Administered 2018-05-23: 100 mg via INTRAVENOUS

## 2018-05-23 MED ORDER — FENTANYL CITRATE (PF) 100 MCG/2ML IJ SOLN: 25.0000 ug | INTRAMUSCULAR | Status: DC | PRN

## 2018-05-23 MED ORDER — FENTANYL CITRATE (PF) 100 MCG/2ML IJ SOLN
INTRAMUSCULAR | Status: AC
  Filled 2018-05-23: qty 2

## 2018-05-23 MED ORDER — EPHEDRINE SULFATE 50 MG/ML IJ SOLN
INTRAMUSCULAR | Status: DC | PRN
  Administered 2018-05-23 (×3): 5 mg via INTRAVENOUS

## 2018-05-23 MED ORDER — CELECOXIB 200 MG PO CAPS
ORAL_CAPSULE | ORAL | Status: AC
  Administered 2018-05-23: 200 mg via ORAL
  Filled 2018-05-23: qty 1

## 2018-05-23 MED ORDER — ACETAMINOPHEN 500 MG PO TABS
1000.0000 mg | ORAL_TABLET | ORAL | Status: AC
  Administered 2018-05-23: 1000 mg via ORAL

## 2018-05-23 MED ORDER — SUGAMMADEX SODIUM 200 MG/2ML IV SOLN
INTRAVENOUS | Status: DC | PRN
  Administered 2018-05-23: 200 mg via INTRAVENOUS

## 2018-05-23 MED ORDER — PROPOFOL 10 MG/ML IV BOLUS
INTRAVENOUS | Status: DC | PRN
  Administered 2018-05-23: 200 mg via INTRAVENOUS

## 2018-05-23 MED ORDER — FENTANYL CITRATE (PF) 100 MCG/2ML IJ SOLN
INTRAMUSCULAR | Status: DC | PRN
  Administered 2018-05-23: 25 ug via INTRAVENOUS
  Administered 2018-05-23: 50 ug via INTRAVENOUS
  Administered 2018-05-23: 25 ug via INTRAVENOUS
  Administered 2018-05-23: 100 ug via INTRAVENOUS

## 2018-05-23 MED ORDER — PROPOFOL 10 MG/ML IV BOLUS
INTRAVENOUS | Status: AC
  Filled 2018-05-23: qty 20

## 2018-05-23 MED ORDER — TRAMADOL HCL 50 MG PO TABS: 50.0000 mg | ORAL_TABLET | Freq: Four times a day (QID) | ORAL | 1 refills | Status: DC | PRN

## 2018-05-23 MED ORDER — FAMOTIDINE 20 MG PO TABS
20.0000 mg | ORAL_TABLET | Freq: Once | ORAL | Status: AC
  Administered 2018-05-23: 20 mg via ORAL

## 2018-05-23 MED ORDER — BUPIVACAINE-EPINEPHRINE 0.25% -1:200000 IJ SOLN
INTRAMUSCULAR | Status: DC | PRN
  Administered 2018-05-23: 10 mL

## 2018-05-23 MED ORDER — ONDANSETRON HCL 4 MG/2ML IJ SOLN
INTRAMUSCULAR | Status: AC
  Filled 2018-05-23: qty 2

## 2018-05-23 MED ORDER — CHLORHEXIDINE GLUCONATE CLOTH 2 % EX PADS: 6.0000 | MEDICATED_PAD | Freq: Once | CUTANEOUS | Status: DC

## 2018-05-23 MED ORDER — DEXAMETHASONE SODIUM PHOSPHATE 10 MG/ML IJ SOLN
INTRAMUSCULAR | Status: AC
  Filled 2018-05-23: qty 1

## 2018-05-23 MED ORDER — ONDANSETRON HCL 4 MG/2ML IJ SOLN: 4.0000 mg | Freq: Once | INTRAMUSCULAR | Status: DC | PRN

## 2018-05-23 MED ORDER — LACTATED RINGERS IV SOLN
INTRAVENOUS | Status: DC
  Administered 2018-05-23: 14:00:00 via INTRAVENOUS

## 2018-05-23 MED ORDER — EPHEDRINE SULFATE 50 MG/ML IJ SOLN
INTRAMUSCULAR | Status: AC
  Filled 2018-05-23: qty 1

## 2018-05-23 MED ORDER — CEFAZOLIN SODIUM-DEXTROSE 2-4 GM/100ML-% IV SOLN
INTRAVENOUS | Status: AC
  Filled 2018-05-23: qty 100

## 2018-05-23 MED ORDER — ONDANSETRON HCL 4 MG/2ML IJ SOLN
INTRAMUSCULAR | Status: DC | PRN
  Administered 2018-05-23: 4 mg via INTRAVENOUS

## 2018-05-23 MED ORDER — ROCURONIUM BROMIDE 100 MG/10ML IV SOLN
INTRAVENOUS | Status: DC | PRN
  Administered 2018-05-23: 10 mg via INTRAVENOUS
  Administered 2018-05-23: 30 mg via INTRAVENOUS

## 2018-05-23 MED ORDER — ACETAMINOPHEN 500 MG PO TABS
ORAL_TABLET | ORAL | Status: AC
  Administered 2018-05-23: 1000 mg via ORAL
  Filled 2018-05-23: qty 2

## 2018-05-23 MED ORDER — DEXAMETHASONE SODIUM PHOSPHATE 10 MG/ML IJ SOLN
INTRAMUSCULAR | Status: DC | PRN
  Administered 2018-05-23: 8 mg via INTRAVENOUS

## 2018-05-23 MED ORDER — LIDOCAINE HCL (CARDIAC) PF 100 MG/5ML IV SOSY
PREFILLED_SYRINGE | INTRAVENOUS | Status: DC | PRN
  Administered 2018-05-23: 100 mg via INTRAVENOUS

## 2018-05-23 MED ORDER — GABAPENTIN 300 MG PO CAPS: 300.0000 mg | ORAL_CAPSULE | ORAL | Status: DC

## 2018-05-23 MED ORDER — ROCURONIUM BROMIDE 50 MG/5ML IV SOLN
INTRAVENOUS | Status: AC
  Filled 2018-05-23: qty 1

## 2018-05-23 MED ORDER — SUGAMMADEX SODIUM 200 MG/2ML IV SOLN
INTRAVENOUS | Status: AC
  Filled 2018-05-23: qty 2

## 2018-05-23 MED ORDER — BUPIVACAINE LIPOSOME 1.3 % IJ SUSP
INTRAMUSCULAR | Status: DC | PRN
  Administered 2018-05-23: 20 mL

## 2018-05-23 MED ORDER — BUPIVACAINE LIPOSOME 1.3 % IJ SUSP
INTRAMUSCULAR | Status: AC
  Filled 2018-05-23: qty 20

## 2018-05-23 MED ORDER — GABAPENTIN 300 MG PO CAPS
ORAL_CAPSULE | ORAL | Status: AC
  Administered 2018-05-23: 300 mg
  Filled 2018-05-23: qty 1

## 2018-05-23 MED ORDER — CELECOXIB 200 MG PO CAPS
200.0000 mg | ORAL_CAPSULE | ORAL | Status: AC
  Administered 2018-05-23: 200 mg via ORAL

## 2018-05-23 SURGICAL SUPPLY — 44 items
BENZOIN TINCTURE PRP APPL 2/3 (GAUZE/BANDAGES/DRESSINGS) IMPLANT
BLADE CLIPPER SURG (BLADE) ×1 IMPLANT
BLADE SURG 15 STRL LF DISP TIS (BLADE) ×1 IMPLANT
BLADE SURG 15 STRL SS (BLADE) ×1
CHLORAPREP W/TINT 26ML (MISCELLANEOUS) ×2 IMPLANT
COVER BACK TABLE 60X90IN (DRAPES) ×2 IMPLANT
COVER MAYO STAND STRL (DRAPES) ×2 IMPLANT
COVER WAND RF STERILE (DRAPES) IMPLANT
DECANTER SPIKE VIAL GLASS SM (MISCELLANEOUS) IMPLANT
DERMABOND ADVANCED (GAUZE/BANDAGES/DRESSINGS) ×1
DERMABOND ADVANCED .7 DNX12 (GAUZE/BANDAGES/DRESSINGS) ×1 IMPLANT
DRAIN PENROSE 1/2X12 LTX STRL (WOUND CARE) ×2 IMPLANT
DRAPE LAPAROTOMY TRNSV 102X78 (DRAPE) ×2 IMPLANT
DRAPE UTILITY XL STRL (DRAPES) ×2 IMPLANT
ELECT REM PT RETURN 9FT ADLT (ELECTROSURGICAL) ×2
ELECTRODE REM PT RTRN 9FT ADLT (ELECTROSURGICAL) ×1 IMPLANT
GLOVE BIO SURGEON STRL SZ7.5 (GLOVE) ×2 IMPLANT
GOWN STRL REUS W/ TWL LRG LVL3 (GOWN DISPOSABLE) ×2 IMPLANT
GOWN STRL REUS W/TWL LRG LVL3 (GOWN DISPOSABLE) ×2
MESH HERNIA 4X6 PROLITE RECT (Mesh General) IMPLANT
MESH POLY 4X6 (Mesh General) ×1 IMPLANT
NDL HYPO 25X1 1.5 SAFETY (NEEDLE) ×1 IMPLANT
NEEDLE HYPO 22GX1.5 SAFETY (NEEDLE) ×1 IMPLANT
NEEDLE HYPO 25X1 1.5 SAFETY (NEEDLE) ×2 IMPLANT
NS IRRIG 1000ML POUR BTL (IV SOLUTION) ×2 IMPLANT
PACK BASIN DAY SURGERY FS (CUSTOM PROCEDURE TRAY) ×2 IMPLANT
PENCIL BUTTON HOLSTER BLD 10FT (ELECTRODE) ×2 IMPLANT
SLEEVE SCD COMPRESS KNEE MED (MISCELLANEOUS) ×2 IMPLANT
SPONGE LAP 18X18 RF (DISPOSABLE) ×2 IMPLANT
STRIP CLOSURE SKIN 1/2X4 (GAUZE/BANDAGES/DRESSINGS) IMPLANT
SUT MON AB 4-0 PC3 18 (SUTURE) ×2 IMPLANT
SUT PROLENE 2 0 SH DA (SUTURE) ×4 IMPLANT
SUT SILK 2 0 SH (SUTURE) ×2 IMPLANT
SUT SILK 3 0 SH 30 (SUTURE) IMPLANT
SUT SILK 3 0 TIES 17X18 (SUTURE) ×1
SUT SILK 3-0 18XBRD TIE BLK (SUTURE) ×1 IMPLANT
SUT VIC AB 0 SH 27 (SUTURE) IMPLANT
SUT VIC AB 2-0 SH 27 (SUTURE) ×1
SUT VIC AB 2-0 SH 27XBRD (SUTURE) ×1 IMPLANT
SUT VIC AB 3-0 SH 27 (SUTURE) ×1
SUT VIC AB 3-0 SH 27X BRD (SUTURE) ×1 IMPLANT
SYR 10ML LL (SYRINGE) ×2 IMPLANT
SYR CONTROL 10ML LL (SYRINGE) ×2 IMPLANT
TOWEL GREEN STERILE FF (TOWEL DISPOSABLE) ×4 IMPLANT

## 2018-05-23 NOTE — H&P (Signed)
Evan Giles  Location: [place] Office Patient #: [phone] DOB: 25-Jan-1941 Married / Language: English / Race: White Male   History of Present Illness The patient is a 78 year old male who presents with an inguinal hernia. We are asked to see the patient in consultation by Dr. Karsten Ro to evaluate him for a left inguinal hernia. The patient is a 78 year old white male who presents with a bulge in the left groin that started in the last few months. He had spinal surgery in sept and had significant constipation afterward for which he strained a lot. He then began noticing a bulge. He reports some discomfort associated with it. He denies any nausea or vomiting. The constipation is improved. he saw his urologist recently for a UTI   Past Surgical History  Tonsillectomy   Diagnostic Studies History  Colonoscopy  5-10 years ago  Allergies  HYDROcodone-Acetaminophen *ANALGESICS - OPIOID*  Itching.  Medication History  Aspirin (81MG  Tablet, Oral) Active. Atorvastatin Calcium (20MG  Tablet, Oral) Active. Losartan Potassium-HCTZ (Oral) Specific strength unknown - Active. Medications Reconciled  Social History  Caffeine use  Carbonated beverages, Coffee, Tea. No alcohol use  No drug use  Tobacco use  Never smoker.  Family History  Alcohol Abuse  Father. Heart Disease  Father.  Other Problems High blood pressure  Hypercholesterolemia  Inguinal Hernia     Review of Systems  General Not Present- Appetite Loss, Chills, Fatigue, Fever, Night Sweats, Weight Gain and Weight Loss. Skin Not Present- Change in Wart/Mole, Dryness, Hives, Jaundice, New Lesions, Non-Healing Wounds, Rash and Ulcer. HEENT Present- Hearing Loss and Wears glasses/contact lenses. Not Present- Earache, Hoarseness, Nose Bleed, Oral Ulcers, Ringing in the Ears, Seasonal Allergies, Sinus Pain, Sore Throat, Visual Disturbances and Yellow Eyes. Cardiovascular Not Present- Chest Pain, Difficulty  Breathing Lying Down, Leg Cramps, Palpitations, Rapid Heart Rate, Shortness of Breath and Swelling of Extremities. Male Genitourinary Present- Change in Urinary Stream and Frequency. Not Present- Blood in Urine, Impotence, Nocturia, Painful Urination, Urgency and Urine Leakage. Musculoskeletal Not Present- Back Pain, Joint Pain, Joint Stiffness, Muscle Pain, Muscle Weakness and Swelling of Extremities. Neurological Present- Trouble walking. Not Present- Decreased Memory, Fainting, Headaches, Numbness, Seizures, Tingling, Tremor and Weakness. Psychiatric Not Present- Anxiety, Bipolar, Change in Sleep Pattern, Depression, Fearful and Frequent crying. Endocrine Not Present- Cold Intolerance, Excessive Hunger, Hair Changes, Heat Intolerance, Hot flashes and New Diabetes. Hematology Not Present- Blood Thinners, Easy Bruising, Excessive bleeding, Gland problems, HIV and Persistent Infections.  Vitals  Weight: 226.38 lb Height: 72in Body Surface Area: 2.25 m Body Mass Index: 30.7 kg/m  Temp.: 98.69F(Oral)  Pulse: 73 (Regular)  BP: 132/80 (Sitting, Left Arm, Standard)       Physical Exam  General Mental Status-Alert. General Appearance-Consistent with stated age. Hydration-Well hydrated. Voice-Normal.  Head and Neck Head-normocephalic, atraumatic with no lesions or palpable masses. Trachea-midline. Thyroid Gland Characteristics - normal size and consistency.  Eye Eyeball - Bilateral-Extraocular movements intact. Sclera/Conjunctiva - Bilateral-No scleral icterus.  Chest and Lung Exam Chest and lung exam reveals -quiet, even and easy respiratory effort with no use of accessory muscles and on auscultation, normal breath sounds, no adventitious sounds and normal vocal resonance. Inspection Chest Wall - Normal. Back - normal.  Cardiovascular Cardiovascular examination reveals -normal heart sounds, regular rate and rhythm with no murmurs and normal pedal  pulses bilaterally.  Abdomen Inspection Inspection of the abdomen reveals - No Hernias. Skin - Scar - no surgical scars. Palpation/Percussion Palpation and Percussion of the abdomen reveal -  Soft, Non Tender, No Rebound tenderness, No Rigidity (guarding) and No hepatosplenomegaly. Auscultation Auscultation of the abdomen reveals - Bowel sounds normal.  Male Genitourinary Note: there is a moderate sized bulge in the left groin that does reduce. There is no palpable bulge or impulse with straining in the right groin   Neurologic Neurologic evaluation reveals -alert and oriented x 3 with no impairment of recent or remote memory. Mental Status-Normal.  Musculoskeletal Normal Exam - Left-Upper Extremity Strength Normal and Lower Extremity Strength Normal. Normal Exam - Right-Upper Extremity Strength Normal and Lower Extremity Strength Normal.  Lymphatic Head & Neck  General Head & Neck Lymphatics: Bilateral - Description - Normal. Axillary  General Axillary Region: Bilateral - Description - Normal. Tenderness - Non Tender. Femoral & Inguinal  Generalized Femoral & Inguinal Lymphatics: Bilateral - Description - Normal. Tenderness - Non Tender.    Assessment & Plan  INGUINAL HERNIA OF LEFT SIDE WITHOUT OBSTRUCTION OR GANGRENE (K40.90) Impression: The patient appears to have a moderate sized symptomatic left inguinal hernia. because of the risk of incarceration and strangulation I think he would benefit from having it fixed. He would also like to have this done. I have discussed with him in detail the risks and benefits of the surgery as well as some of the technical aspects including the use of mesh and the risk of chronic pain and he understands and wishes to proceed.

## 2018-05-23 NOTE — Transfer of Care (Signed)
Immediate Anesthesia Transfer of Care Note  Patient: Evan Giles  Procedure(s) Performed: LEFT INGUINAL HERNIA REPAIR WITH MESH ERAS PATHWAY (Left Inguinal)  Patient Location: PACU  Anesthesia Type:General  Level of Consciousness: sedated  Airway & Oxygen Therapy: Patient Spontanous Breathing and Patient connected to face mask oxygen  Post-op Assessment: Report given to RN and Post -op Vital signs reviewed and stable  Post vital signs: Reviewed and stable  Last Vitals:  Vitals Value Taken Time  BP 134/64 05/23/2018  3:54 PM  Temp 36.3 C 05/23/2018  3:54 PM  Pulse 70 05/23/2018  3:55 PM  Resp 11 05/23/2018  3:55 PM  SpO2 99 % 05/23/2018  3:55 PM  Vitals shown include unvalidated device data.  Last Pain:  Vitals:   05/23/18 1554  TempSrc:   PainSc: 0-No pain         Complications: No apparent anesthesia complications

## 2018-05-23 NOTE — Anesthesia Preprocedure Evaluation (Signed)
Anesthesia Evaluation  Patient identified by MRN, date of birth, ID band Patient awake    Reviewed: Allergy & Precautions, H&P , NPO status , Patient's Chart, lab work & pertinent test results, reviewed documented beta blocker date and time   History of Anesthesia Complications Negative for: history of anesthetic complications  Airway Mallampati: III  TM Distance: >3 FB Neck ROM: full    Dental no notable dental hx. (+) Implants, Caps, Teeth Intact Patient reports that top right medial incisor is temp cap and is somewhat loose:   Pulmonary neg pulmonary ROS,           Cardiovascular Exercise Tolerance: Good hypertension, On Medications (-) angina(-) CAD, (-) Past MI, (-) Cardiac Stents and (-) CABG (-) dysrhythmias (-) Valvular Problems/Murmurs     Neuro/Psych negative neurological ROS  negative psych ROS   GI/Hepatic Neg liver ROS, PUD,   Endo/Other  negative endocrine ROS  Renal/GU negative Renal ROS  negative genitourinary   Musculoskeletal  (+) Arthritis ,   Abdominal   Peds  Hematology negative hematology ROS (+)   Anesthesia Other Findings Past Medical History:   Hyperlipidemia                                               Hypertension                                                 Osteoarthrosis of knee                                       Peptic ulcer                                                   Comment:years ago   Reproductive/Obstetrics negative OB ROS                             Anesthesia Physical  Anesthesia Plan  ASA: II  Anesthesia Plan: General   Post-op Pain Management:    Induction: Intravenous  PONV Risk Score and Plan: 2 and Ondansetron, Dexamethasone and Treatment may vary due to age or medical condition  Airway Management Planned: LMA and Oral ETT  Additional Equipment:   Intra-op Plan:   Post-operative Plan: Extubation in OR  Informed  Consent: I have reviewed the patients History and Physical, chart, labs and discussed the procedure including the risks, benefits and alternatives for the proposed anesthesia with the patient or authorized representative who has indicated his/her understanding and acceptance.     Dental Advisory Given  Plan Discussed with: Anesthesiologist, CRNA and Surgeon  Anesthesia Plan Comments:         Anesthesia Quick Evaluation

## 2018-05-23 NOTE — OR Nursing (Signed)
Patient must void prior to discharge per Dr. Marlou Starks.

## 2018-05-23 NOTE — Anesthesia Procedure Notes (Signed)
Procedure Name: Intubation Date/Time: 05/23/2018 2:22 PM Performed by: Hedda Slade, CRNA Pre-anesthesia Checklist: Patient identified, Patient being monitored, Timeout performed, Emergency Drugs available and Suction available Patient Re-evaluated:Patient Re-evaluated prior to induction Oxygen Delivery Method: Circle system utilized Preoxygenation: Pre-oxygenation with 100% oxygen Induction Type: IV induction Ventilation: Mask ventilation without difficulty Laryngoscope Size: Mac and 4 Grade View: Grade I Tube type: Oral Tube size: 7.5 mm Number of attempts: 1 Airway Equipment and Method: Stylet Placement Confirmation: ETT inserted through vocal cords under direct vision,  positive ETCO2 and breath sounds checked- equal and bilateral Secured at: 22 cm Tube secured with: Tape Dental Injury: Teeth and Oropharynx as per pre-operative assessment

## 2018-05-23 NOTE — Discharge Instructions (Signed)

## 2018-05-23 NOTE — Anesthesia Post-op Follow-up Note (Signed)
Anesthesia QCDR form completed.        

## 2018-05-23 NOTE — Op Note (Signed)
05/23/2018  3:49 PM  PATIENT:  Evan Giles  78 y.o. male  PRE-OPERATIVE DIAGNOSIS:  left inguinal hernia  POST-OPERATIVE DIAGNOSIS:  left inguinal hernia  PROCEDURE:  Procedure(s): LEFT INGUINAL HERNIA REPAIR WITH MESH ERAS PATHWAY (Left)  SURGEON:  Surgeon(s) and Role:    * Jovita Kussmaul, MD - Primary  PHYSICIAN ASSISTANT:   ASSISTANTS: none   ANESTHESIA:   local and general  EBL:  minimal   BLOOD ADMINISTERED:none  DRAINS: none   LOCAL MEDICATIONS USED:  MARCAINE     SPECIMEN:  No Specimen  DISPOSITION OF SPECIMEN:  N/A  COUNTS:  YES  TOURNIQUET:  * No tourniquets in log *  DICTATION: .Dragon Dictation   After informed consent was obtained the patient was brought to the operating room and placed in the supine position on the operating table.  After adequate induction of general anesthesia the patient's abdomen and left groin were prepped with ChloraPrep, allowed to dry, and draped in usual sterile manner.  An appropriate timeout was performed.  The left groin area was then infiltrated with quarter percent Marcaine.  A small incision was made from the edge of the pubic tubercle on the left towards the anterior superior iliac spine.  The incision was carried through the skin and subcutaneous tissue sharply with the electrocautery until the fascia of the external oblique was encountered.  3 small bridging veins were clamped with hemostats, divided, and ligated with 3-0 silk ties.  The external oblique fascia was opened along its fibers towards the apex of the external ring with a 15 blade knife and Metzenbaum scissors.  A [place] retractor was deployed.  Blunt dissection was carried out of the cord structures until they could be surrounded between 2 fingers.  Half inch Penrose drain was placed around the cord structures for retraction purposes.  The cord was then gently skeletonized by blunt hemostat dissection.  I was able to identify a hernia directly through the  floor of the canal medial to the cord.  There was not a hernia sac associated with the cord.  Because this hernia was very broad-based it was reduced and the floor of the canal was repaired with interrupted 0 Vicryl stitches.  Next a 4 x 6 piece of Prolite Atrium mesh was chosen and cut to the appropriate size.  The mesh was sewed inferiorly to the shelving edge of the inguinal ligament with a running 2-0 Prolene stitch.  Tails were cut in the mesh laterally and the tails were wrapped around the cord structures.  Superiorly the mesh was sewed to the musculoaponeurotic strength layer of the transversalis with interrupted 2-0 Prolene vertical mattress stitches.  The tails of the mesh were anchored to the shelving edge of the inguinal ligament lateral to the cord with interrupted 2-0 Prolene stitch.  Once this was accomplished the mesh appeared to be in good position and the hernia seem well repaired.  The wound was then irrigated with copious amounts of saline.  Of note during the dissection the ilioinguinal nerve was identified and seemed to be involved with some scar tissue.  It was dissected out proximally and distally, clamped, divided, and ligated with 3-0 silk ties.  Next the external oblique fascia was reapproximated with a running 2-0 Vicryl stitch.  The wound was then infiltrated with Exparel.  The subcutaneous fascia was closed with a running 3-0 Vicryl stitch.  The skin was then closed with a running 4-0 Monocryl subcuticular stitch.  Dermabond dressings were applied.  The patient tolerated the procedure well.  At the end of the case all needle sponge and instrument counts were correct.  The patient was then awakened and taken to recovery in stable condition.  The patient's testicle was in the scrotum at the end of the case.  PLAN OF CARE: Discharge to home after PACU  PATIENT DISPOSITION:  PACU - hemodynamically stable.   Delay start of Pharmacological VTE agent (>24hrs) due to surgical blood loss or  risk of bleeding: not applicable

## 2018-05-23 NOTE — Interval H&P Note (Signed)
History and Physical Interval Note:  05/23/2018 1:55 PM  Evan Giles  has presented today for surgery, with the diagnosis of left inguinal hernia  The various methods of treatment have been discussed with the patient and family. After consideration of risks, benefits and other options for treatment, the patient has consented to  Procedure(s): LEFT INGUINAL [place] (Left) as a surgical intervention .  The patient's history has been reviewed, patient examined, no change in status, stable for surgery.  I have reviewed the patient's chart and labs.  Questions were answered to the patient's satisfaction.     Autumn Messing III

## 2018-05-24 ENCOUNTER — Encounter: Payer: Self-pay | Admitting: General Surgery

## 2018-05-24 NOTE — Anesthesia Postprocedure Evaluation (Signed)
Anesthesia Post Note  Patient: Evan Giles  Procedure(s) Performed: LEFT INGUINAL HERNIA REPAIR WITH MESH ERAS PATHWAY (Left Inguinal)  Patient location during evaluation: PACU Anesthesia Type: General Level of consciousness: awake and alert Pain management: pain level controlled Vital Signs Assessment: post-procedure vital signs reviewed and stable Respiratory status: spontaneous breathing, nonlabored ventilation, respiratory function stable and patient connected to nasal cannula oxygen Cardiovascular status: blood pressure returned to baseline and stable Postop Assessment: no apparent nausea or vomiting Anesthetic complications: no     Last Vitals:  Vitals:   05/23/18 1612 05/23/18 1658  BP: 128/70 140/75  Pulse: 80 76  Resp: 15 16  Temp:  (!) 36.2 C  SpO2: 98% 100%    Last Pain:  Vitals:   05/24/18 0811  TempSrc:   PainSc: 3                  Martha Clan

## 2018-05-31 ENCOUNTER — Other Ambulatory Visit: Payer: Self-pay | Admitting: Neurosurgery

## 2018-06-06 ENCOUNTER — Ambulatory Visit
Admission: RE | Admit: 2018-06-06 | Discharge: 2018-06-06 | Disposition: A | Discharge status: Home / Self Care | Payer: Medicare Other | Source: Ambulatory Visit | Attending: Physician Assistant | Admitting: Physician Assistant

## 2018-06-06 DIAGNOSIS — IMO0001 Reserved for inherently not codable concepts without codable children: Secondary | ICD-10-CM

## 2018-06-06 DIAGNOSIS — H9041 Sensorineural hearing loss, unilateral, right ear, with unrestricted hearing on the contralateral side: Secondary | ICD-10-CM

## 2018-06-06 DIAGNOSIS — H9190 Unspecified hearing loss, unspecified ear: Secondary | ICD-10-CM | POA: Diagnosis not present

## 2018-06-06 MED ORDER — GADOBUTROL 1 MMOL/ML IV SOLN
10.0000 mL | Freq: Once | INTRAVENOUS | Status: AC | PRN
  Administered 2018-06-06: 10 mL via INTRAVENOUS

## 2018-06-11 ENCOUNTER — Other Ambulatory Visit: Payer: Self-pay | Admitting: Internal Medicine

## 2018-06-12 NOTE — Pre-Procedure Instructions (Signed)
Evan Giles  06/12/2018      [facility] [address], [place] [address] Phone: [phone] Fax: [phone]  [facility] [place], [place] [address] [address] Phone: [phone] Fax: [phone]    Your procedure is scheduled on February 26  Report to [facility] Admitting at 1:50 P.M.  Call this number if you have problems the morning of surgery:  [phone]   Remember:  Do not eat or drink after midnight.    Take these medicines the morning of surgery with A SIP OF WATER  acetaminophen (TYLENOL)  If needed  Follow your surgeon's instructions on when to stop Asprin.  If no instructions were given by your surgeon then you will need to call the office to get those instructions.    7 days prior to surgery STOP taking any Aspirin (unless otherwise instructed by your surgeon), Aleve, Naproxen, Ibuprofen, Motrin, Advil, Goody's, BC's, all herbal medications, fish oil, and all vitamins.    Do not wear jewelry  Do not wear lotions, powders, or cologne, or deodorant.  Men may shave face and neck.  Do not bring valuables to the hospital.  [facility] is not responsible for any belongings or valuables.  Contacts, dentures or bridgework may not be worn into surgery.  Leave your suitcase in the car.  After surgery it may be brought to your room.  For patients admitted to the hospital, discharge time will be determined by your treatment team.  Patients discharged the day of surgery will not be allowed to drive home.    Special instructions:   [facility]- Preparing For Surgery  Before surgery, you can play an important role. Because skin is not sterile, your skin needs to be as free of germs as possible. You can reduce the number of germs on your skin by washing with CHG (chlorahexidine gluconate) Soap before surgery.  CHG is an antiseptic cleaner which kills  germs and bonds with the skin to continue killing germs even after washing.    Oral Hygiene is also important to reduce your risk of infection.  Remember - BRUSH YOUR TEETH THE MORNING OF SURGERY WITH YOUR REGULAR TOOTHPASTE  Please do not use if you have an allergy to CHG or antibacterial soaps. If your skin becomes reddened/irritated stop using the CHG.  Do not shave (including legs and underarms) for at least 48 hours prior to first CHG shower. It is OK to shave your face.  Please follow these instructions carefully.   1. Shower the NIGHT BEFORE SURGERY and the MORNING OF SURGERY with CHG.   2. If you chose to wash your hair, wash your hair first as usual with your normal shampoo.  3. After you shampoo, rinse your hair and body thoroughly to remove the shampoo.  4. Use CHG as you would any other liquid soap. You can apply CHG directly to the skin and wash gently with a scrungie or a clean washcloth.   5. Apply the CHG Soap to your body ONLY FROM THE NECK DOWN.  Do not use on open wounds or open sores. Avoid contact with your eyes, ears, mouth and genitals (private parts). Wash Face and genitals (private parts)  with your normal soap.  6. Wash thoroughly, paying special attention to the area where your surgery will be performed.  7. Thoroughly rinse your body with warm water  from the neck down.  8. DO NOT shower/wash with your normal soap after using and rinsing off the CHG Soap.  9. Pat yourself dry with a CLEAN TOWEL.  10. Wear CLEAN PAJAMAS to bed the night before surgery, wear comfortable clothes the morning of surgery  11. Place CLEAN SHEETS on your bed the night of your first shower and DO NOT SLEEP WITH PETS.    Day of Surgery:  Do not apply any deodorants/lotions.  Please wear clean clothes to the hospital/surgery center.   Remember to brush your teeth WITH YOUR REGULAR TOOTHPASTE.    Please read over the following fact sheets that you were given.

## 2018-06-13 ENCOUNTER — Encounter (HOSPITAL_COMMUNITY): Payer: Self-pay

## 2018-06-13 ENCOUNTER — Other Ambulatory Visit: Payer: Self-pay

## 2018-06-13 ENCOUNTER — Encounter (HOSPITAL_COMMUNITY)
Admission: RE | Admit: 2018-06-13 | Discharge: 2018-06-13 | Disposition: A | Discharge status: Home / Self Care | Payer: Medicare Other | Source: Ambulatory Visit | Attending: Neurosurgery | Admitting: Neurosurgery

## 2018-06-13 DIAGNOSIS — Z01812 Encounter for preprocedural laboratory examination: Secondary | ICD-10-CM | POA: Diagnosis not present

## 2018-06-13 HISTORY — DX: Diverticulitis of intestine, part unspecified, without perforation or abscess without bleeding: K57.92

## 2018-06-13 LAB — BASIC METABOLIC PANEL
Anion gap: 8 (ref 5–15)
BUN: 13 mg/dL (ref 8–23)
CO2: 26 mmol/L (ref 22–32)
Calcium: 9.6 mg/dL (ref 8.9–10.3)
Chloride: 106 mmol/L (ref 98–111)
Creatinine, Ser: 0.83 mg/dL (ref 0.61–1.24)
GFR calc Af Amer: >60 mL/min (ref >60)
Glucose, Bld: 97 mg/dL (ref 70–99)
Potassium: 4.1 mmol/L (ref 3.5–5.1)
Sodium: 140 mmol/L (ref 135–145)

## 2018-06-13 LAB — CBC
HCT: 43.1 % (ref 39.0–52.0)
Hemoglobin: 13.4 g/dL (ref 13.0–17.0)
MCH: 28.2 pg (ref 26.0–34.0)
MCHC: 31.1 g/dL (ref 30.0–36.0)
MCV: 90.5 fL (ref 80.0–100.0)
Platelets: 301 10*3/uL (ref 150–400)
RBC: 4.76 MIL/uL (ref 4.22–5.81)
RDW: 12.7 % (ref 11.5–15.5)
WBC: 7.1 10*3/uL (ref 4.0–10.5)
nRBC: 0 % (ref 0.0–0.2)

## 2018-06-13 LAB — SURGICAL PCR SCREEN
MRSA, PCR: NEGATIVE
STAPHYLOCOCCUS AUREUS: NEGATIVE

## 2018-06-13 NOTE — Progress Notes (Signed)
PCP - richard Silvio Pate Cardiologist - denies  Chest x-ray - not needed EKG - 05/16/18 Stress Test - denies ECHO - denies Cardiac Cath - denies  Aspirin Instructions:stopped a week ago  Anesthesia review: yes abnormal EKG patient did have surgery at [facility] in January   Patient denies shortness of breath, fever, cough and chest pain at PAT appointment   Patient verbalized understanding of instructions that were given to them at the PAT appointment. Patient was also instructed that they will need to review over the PAT instructions again at home before surgery.

## 2018-06-19 NOTE — Anesthesia Preprocedure Evaluation (Addendum)
Anesthesia Evaluation  Patient identified by MRN, date of birth, ID band Patient awake    Reviewed: Allergy & Precautions, NPO status , Patient's Chart, lab work & pertinent test results  History of Anesthesia Complications Negative for: history of anesthetic complications  Airway Mallampati: II  TM Distance: >3 FB Neck ROM: Full    Dental  (+) Teeth Intact, Dental Advisory Given   Pulmonary neg pulmonary ROS,    Pulmonary exam normal breath sounds clear to auscultation       Cardiovascular hypertension, Pt. on medications Normal cardiovascular exam Rhythm:Regular Rate:Normal     Neuro/Psych Cervical myelopathy    GI/Hepatic Neg liver ROS, PUD,   Endo/Other  negative endocrine ROS  Renal/GU negative Renal ROS     Musculoskeletal  (+) Arthritis ,   Abdominal   Peds  Hematology negative hematology ROS (+)   Anesthesia Other Findings Day of surgery medications reviewed with the patient.  Reproductive/Obstetrics                            Anesthesia Physical Anesthesia Plan  ASA: II  Anesthesia Plan: General   Post-op Pain Management:    Induction: Intravenous  PONV Risk Score and Plan: 3 and Treatment may vary due to age or medical condition, Ondansetron and Dexamethasone  Airway Management Planned: Oral ETT  Additional Equipment:   Intra-op Plan:   Post-operative Plan: Extubation in OR  Informed Consent: I have reviewed the patients History and Physical, chart, labs and discussed the procedure including the risks, benefits and alternatives for the proposed anesthesia with the patient or authorized representative who has indicated his/her understanding and acceptance.     Dental advisory given  Plan Discussed with: CRNA  Anesthesia Plan Comments:        Anesthesia Quick Evaluation

## 2018-06-20 ENCOUNTER — Ambulatory Visit (HOSPITAL_COMMUNITY): Payer: Medicare Other

## 2018-06-20 ENCOUNTER — Ambulatory Visit (HOSPITAL_COMMUNITY): Payer: Medicare Other | Admitting: Anesthesiology

## 2018-06-20 ENCOUNTER — Encounter (HOSPITAL_COMMUNITY)
Admission: RE | Disposition: A | Discharge status: Home / Self Care | Payer: Self-pay | Source: Home / Self Care | Attending: Neurosurgery

## 2018-06-20 ENCOUNTER — Ambulatory Visit (HOSPITAL_COMMUNITY): Payer: Medicare Other | Admitting: Physician Assistant

## 2018-06-20 ENCOUNTER — Other Ambulatory Visit: Payer: Self-pay

## 2018-06-20 ENCOUNTER — Encounter (HOSPITAL_COMMUNITY): Payer: Self-pay | Admitting: Anesthesiology

## 2018-06-20 ENCOUNTER — Ambulatory Visit (HOSPITAL_COMMUNITY)
Admission: RE | Admit: 2018-06-20 | Discharge: 2018-06-21 | Disposition: A | Discharge status: Home / Self Care | Payer: Medicare Other | Attending: Neurosurgery | Admitting: Neurosurgery

## 2018-06-20 DIAGNOSIS — Z7982 Long term (current) use of aspirin: Secondary | ICD-10-CM | POA: Insufficient documentation

## 2018-06-20 DIAGNOSIS — Z885 Allergy status to narcotic agent status: Secondary | ICD-10-CM | POA: Insufficient documentation

## 2018-06-20 DIAGNOSIS — Z8249 Family history of ischemic heart disease and other diseases of the circulatory system: Secondary | ICD-10-CM | POA: Diagnosis not present

## 2018-06-20 DIAGNOSIS — M5416 Radiculopathy, lumbar region: Secondary | ICD-10-CM | POA: Insufficient documentation

## 2018-06-20 DIAGNOSIS — M1710 Unilateral primary osteoarthritis, unspecified knee: Secondary | ICD-10-CM | POA: Diagnosis not present

## 2018-06-20 DIAGNOSIS — E785 Hyperlipidemia, unspecified: Secondary | ICD-10-CM | POA: Diagnosis not present

## 2018-06-20 DIAGNOSIS — Z419 Encounter for procedure for purposes other than remedying health state, unspecified: Secondary | ICD-10-CM

## 2018-06-20 DIAGNOSIS — M48062 Spinal stenosis, lumbar region with neurogenic claudication: Secondary | ICD-10-CM | POA: Diagnosis not present

## 2018-06-20 DIAGNOSIS — Z833 Family history of diabetes mellitus: Secondary | ICD-10-CM | POA: Insufficient documentation

## 2018-06-20 DIAGNOSIS — I10 Essential (primary) hypertension: Secondary | ICD-10-CM | POA: Diagnosis not present

## 2018-06-20 DIAGNOSIS — Z981 Arthrodesis status: Secondary | ICD-10-CM | POA: Diagnosis not present

## 2018-06-20 DIAGNOSIS — Z8711 Personal history of peptic ulcer disease: Secondary | ICD-10-CM | POA: Insufficient documentation

## 2018-06-20 DIAGNOSIS — Z96652 Presence of left artificial knee joint: Secondary | ICD-10-CM | POA: Diagnosis not present

## 2018-06-20 DIAGNOSIS — M48061 Spinal stenosis, lumbar region without neurogenic claudication: Secondary | ICD-10-CM | POA: Diagnosis present

## 2018-06-20 DIAGNOSIS — Z823 Family history of stroke: Secondary | ICD-10-CM | POA: Diagnosis not present

## 2018-06-20 DIAGNOSIS — Z8489 Family history of other specified conditions: Secondary | ICD-10-CM | POA: Diagnosis not present

## 2018-06-20 DIAGNOSIS — Z79899 Other long term (current) drug therapy: Secondary | ICD-10-CM | POA: Insufficient documentation

## 2018-06-20 DIAGNOSIS — M545 Low back pain: Secondary | ICD-10-CM | POA: Diagnosis not present

## 2018-06-20 HISTORY — PX: LUMBAR LAMINECTOMY/DECOMPRESSION MICRODISCECTOMY: SHX5026

## 2018-06-20 HISTORY — DX: Spinal stenosis, lumbar region with neurogenic claudication: M48.062

## 2018-06-20 SURGERY — LUMBAR LAMINECTOMY/DECOMPRESSION MICRODISCECTOMY 2 LEVELS
Anesthesia: General | Site: Spine Lumbar

## 2018-06-20 MED ORDER — ACETAMINOPHEN 10 MG/ML IV SOLN: 1000.0000 mg | Freq: Once | INTRAVENOUS | Status: DC | PRN

## 2018-06-20 MED ORDER — CHLORHEXIDINE GLUCONATE CLOTH 2 % EX PADS: 6.0000 | MEDICATED_PAD | Freq: Once | CUTANEOUS | Status: DC

## 2018-06-20 MED ORDER — PROPOFOL 10 MG/ML IV BOLUS
INTRAVENOUS | Status: DC | PRN
  Administered 2018-06-20: 200 mg via INTRAVENOUS

## 2018-06-20 MED ORDER — DOCUSATE SODIUM 100 MG PO CAPS
100.0000 mg | ORAL_CAPSULE | Freq: Two times a day (BID) | ORAL | Status: DC
  Administered 2018-06-20 – 2018-06-21 (×2): 100 mg via ORAL
  Filled 2018-06-20 (×2): qty 1

## 2018-06-20 MED ORDER — HYDROCHLOROTHIAZIDE 12.5 MG PO CAPS
12.5000 mg | ORAL_CAPSULE | Freq: Every day | ORAL | Status: DC
  Administered 2018-06-21: 12.5 mg via ORAL
  Filled 2018-06-20: qty 1

## 2018-06-20 MED ORDER — PROMETHAZINE HCL 25 MG/ML IJ SOLN: 6.2500 mg | INTRAMUSCULAR | Status: DC | PRN

## 2018-06-20 MED ORDER — OXYCODONE HCL 5 MG PO TABS: 5.0000 mg | ORAL_TABLET | ORAL | Status: DC | PRN

## 2018-06-20 MED ORDER — ONDANSETRON HCL 4 MG/2ML IJ SOLN
INTRAMUSCULAR | Status: DC | PRN
  Administered 2018-06-20: 4 mg via INTRAVENOUS

## 2018-06-20 MED ORDER — OXYCODONE HCL 5 MG PO TABS
5.0000 mg | ORAL_TABLET | Freq: Once | ORAL | Status: AC | PRN
  Administered 2018-06-20: 5 mg via ORAL

## 2018-06-20 MED ORDER — SODIUM CHLORIDE 0.9 % IV SOLN
INTRAVENOUS | Status: DC | PRN
  Administered 2018-06-20: 500 mL

## 2018-06-20 MED ORDER — ACETAMINOPHEN 325 MG PO TABS
650.0000 mg | ORAL_TABLET | ORAL | Status: DC | PRN
  Administered 2018-06-21: 650 mg via ORAL
  Filled 2018-06-20: qty 2

## 2018-06-20 MED ORDER — HYDROMORPHONE HCL 1 MG/ML IJ SOLN: 0.2500 mg | INTRAMUSCULAR | Status: DC | PRN

## 2018-06-20 MED ORDER — TAMSULOSIN HCL 0.4 MG PO CAPS
0.4000 mg | ORAL_CAPSULE | Freq: Every evening | ORAL | Status: DC
  Administered 2018-06-20: 0.4 mg via ORAL
  Filled 2018-06-20: qty 1

## 2018-06-20 MED ORDER — LIDOCAINE HCL (CARDIAC) PF 100 MG/5ML IV SOSY
PREFILLED_SYRINGE | INTRAVENOUS | Status: DC | PRN
  Administered 2018-06-20: 60 mg via INTRAVENOUS
  Administered 2018-06-20: 40 mg via INTRAVENOUS

## 2018-06-20 MED ORDER — OXYCODONE HCL 5 MG PO TABS
ORAL_TABLET | ORAL | Status: AC
  Filled 2018-06-20: qty 1

## 2018-06-20 MED ORDER — SODIUM CHLORIDE 0.9 % IV SOLN: 250.0000 mL | INTRAVENOUS | Status: DC

## 2018-06-20 MED ORDER — ACETAMINOPHEN 500 MG PO TABS
1000.0000 mg | ORAL_TABLET | Freq: Four times a day (QID) | ORAL | Status: DC
  Administered 2018-06-20 – 2018-06-21 (×3): 1000 mg via ORAL
  Filled 2018-06-20 (×3): qty 2

## 2018-06-20 MED ORDER — CYCLOBENZAPRINE HCL 10 MG PO TABS
ORAL_TABLET | ORAL | Status: AC
  Filled 2018-06-20: qty 1

## 2018-06-20 MED ORDER — BACITRACIN ZINC 500 UNIT/GM EX OINT
TOPICAL_OINTMENT | CUTANEOUS | Status: DC | PRN
  Administered 2018-06-20: 1 via TOPICAL

## 2018-06-20 MED ORDER — HEMOSTATIC AGENTS (NO CHARGE) OPTIME: TOPICAL | Status: DC | PRN

## 2018-06-20 MED ORDER — LACTATED RINGERS IV SOLN
INTRAVENOUS | Status: DC
  Administered 2018-06-20: 13:00:00 via INTRAVENOUS

## 2018-06-20 MED ORDER — LACTATED RINGERS IV SOLN
INTRAVENOUS | Status: DC | PRN
  Administered 2018-06-20 (×2): via INTRAVENOUS

## 2018-06-20 MED ORDER — FENTANYL CITRATE (PF) 250 MCG/5ML IJ SOLN
INTRAMUSCULAR | Status: AC
  Filled 2018-06-20: qty 5

## 2018-06-20 MED ORDER — PHENOL 1.4 % MT LIQD: 1.0000 | OROMUCOSAL | Status: DC | PRN

## 2018-06-20 MED ORDER — OXYCODONE HCL 5 MG PO TABS
10.0000 mg | ORAL_TABLET | ORAL | Status: DC | PRN
  Administered 2018-06-20 – 2018-06-21 (×5): 10 mg via ORAL
  Filled 2018-06-20 (×5): qty 2

## 2018-06-20 MED ORDER — BACITRACIN ZINC 500 UNIT/GM EX OINT
TOPICAL_OINTMENT | CUTANEOUS | Status: AC
  Filled 2018-06-20: qty 28.35

## 2018-06-20 MED ORDER — ATORVASTATIN CALCIUM 10 MG PO TABS
20.0000 mg | ORAL_TABLET | Freq: Every day | ORAL | Status: DC
  Administered 2018-06-20: 20 mg via ORAL
  Filled 2018-06-20: qty 2

## 2018-06-20 MED ORDER — ONDANSETRON HCL 4 MG PO TABS: 4.0000 mg | ORAL_TABLET | Freq: Four times a day (QID) | ORAL | Status: DC | PRN

## 2018-06-20 MED ORDER — SODIUM CHLORIDE 0.9% FLUSH: 3.0000 mL | Freq: Two times a day (BID) | INTRAVENOUS | Status: DC

## 2018-06-20 MED ORDER — SODIUM CHLORIDE 0.9 % IV SOLN
INTRAVENOUS | Status: DC | PRN
  Administered 2018-06-20: 25 ug/min via INTRAVENOUS

## 2018-06-20 MED ORDER — LOSARTAN POTASSIUM-HCTZ 100-12.5 MG PO TABS: 1.0000 | ORAL_TABLET | Freq: Every day | ORAL | Status: DC

## 2018-06-20 MED ORDER — ONDANSETRON HCL 4 MG/2ML IJ SOLN: 4.0000 mg | Freq: Four times a day (QID) | INTRAMUSCULAR | Status: DC | PRN

## 2018-06-20 MED ORDER — ACETAMINOPHEN 650 MG RE SUPP: 650.0000 mg | RECTAL | Status: DC | PRN

## 2018-06-20 MED ORDER — ROCURONIUM BROMIDE 50 MG/5ML IV SOSY
PREFILLED_SYRINGE | INTRAVENOUS | Status: DC | PRN
  Administered 2018-06-20: 50 mg via INTRAVENOUS
  Administered 2018-06-20: 10 mg via INTRAVENOUS

## 2018-06-20 MED ORDER — FENTANYL CITRATE (PF) 100 MCG/2ML IJ SOLN
INTRAMUSCULAR | Status: DC | PRN
  Administered 2018-06-20: 100 ug via INTRAVENOUS

## 2018-06-20 MED ORDER — BUPIVACAINE-EPINEPHRINE (PF) 0.5% -1:200000 IJ SOLN
INTRAMUSCULAR | Status: DC | PRN
  Administered 2018-06-20: 30 mL

## 2018-06-20 MED ORDER — SUGAMMADEX SODIUM 200 MG/2ML IV SOLN
INTRAVENOUS | Status: DC | PRN
  Administered 2018-06-20: 204.2 mg via INTRAVENOUS

## 2018-06-20 MED ORDER — 0.9 % SODIUM CHLORIDE (POUR BTL) OPTIME
TOPICAL | Status: DC | PRN
  Administered 2018-06-20: 1000 mL

## 2018-06-20 MED ORDER — CEFAZOLIN SODIUM-DEXTROSE 2-4 GM/100ML-% IV SOLN
INTRAVENOUS | Status: AC
  Filled 2018-06-20: qty 100

## 2018-06-20 MED ORDER — LOSARTAN POTASSIUM 50 MG PO TABS
100.0000 mg | ORAL_TABLET | Freq: Every day | ORAL | Status: DC
  Administered 2018-06-21: 100 mg via ORAL
  Filled 2018-06-20: qty 2

## 2018-06-20 MED ORDER — CEFAZOLIN SODIUM-DEXTROSE 2-4 GM/100ML-% IV SOLN
2.0000 g | Freq: Three times a day (TID) | INTRAVENOUS | Status: AC
  Administered 2018-06-20 – 2018-06-21 (×2): 2 g via INTRAVENOUS
  Filled 2018-06-20 (×2): qty 100

## 2018-06-20 MED ORDER — CEFAZOLIN SODIUM-DEXTROSE 2-4 GM/100ML-% IV SOLN
2.0000 g | INTRAVENOUS | Status: AC
  Administered 2018-06-20: 2 g via INTRAVENOUS

## 2018-06-20 MED ORDER — BUPIVACAINE LIPOSOME 1.3 % IJ SUSP
20.0000 mL | INTRAMUSCULAR | Status: DC
  Filled 2018-06-20: qty 20

## 2018-06-20 MED ORDER — SODIUM CHLORIDE 0.9% FLUSH: 3.0000 mL | INTRAVENOUS | Status: DC | PRN

## 2018-06-20 MED ORDER — THROMBIN 5000 UNITS EX SOLR
CUTANEOUS | Status: AC
  Filled 2018-06-20: qty 15000

## 2018-06-20 MED ORDER — THROMBIN 5000 UNITS EX SOLR
OROMUCOSAL | Status: DC | PRN
  Administered 2018-06-20: 16:00:00

## 2018-06-20 MED ORDER — BUPIVACAINE-EPINEPHRINE (PF) 0.5% -1:200000 IJ SOLN
INTRAMUSCULAR | Status: AC
  Filled 2018-06-20: qty 30

## 2018-06-20 MED ORDER — THROMBIN 5000 UNITS EX SOLR
CUTANEOUS | Status: DC | PRN
  Administered 2018-06-20 (×2): 5000 [IU] via TOPICAL

## 2018-06-20 MED ORDER — DEXAMETHASONE SODIUM PHOSPHATE 10 MG/ML IJ SOLN
INTRAMUSCULAR | Status: DC | PRN
  Administered 2018-06-20: 10 mg via INTRAVENOUS

## 2018-06-20 MED ORDER — HEMOSTATIC AGENTS (NO CHARGE) OPTIME
TOPICAL | Status: DC | PRN
  Administered 2018-06-20: 1 via TOPICAL

## 2018-06-20 MED ORDER — OXYCODONE HCL 5 MG/5ML PO SOLN
5.0000 mg | Freq: Once | ORAL | Status: AC | PRN
  Filled 2018-06-20: qty 5

## 2018-06-20 MED ORDER — CYCLOBENZAPRINE HCL 10 MG PO TABS
10.0000 mg | ORAL_TABLET | Freq: Three times a day (TID) | ORAL | Status: DC | PRN
  Administered 2018-06-20 – 2018-06-21 (×3): 10 mg via ORAL
  Filled 2018-06-20 (×2): qty 1

## 2018-06-20 MED ORDER — BISACODYL 10 MG RE SUPP: 10.0000 mg | Freq: Every day | RECTAL | Status: DC | PRN

## 2018-06-20 MED ORDER — MENTHOL 3 MG MT LOZG: 1.0000 | LOZENGE | OROMUCOSAL | Status: DC | PRN

## 2018-06-20 MED ORDER — MAGNESIUM OXIDE 400 (241.3 MG) MG PO TABS
400.0000 mg | ORAL_TABLET | Freq: Every day | ORAL | Status: DC
  Administered 2018-06-21: 400 mg via ORAL
  Filled 2018-06-20: qty 1

## 2018-06-20 MED ORDER — MORPHINE SULFATE (PF) 4 MG/ML IV SOLN: 4.0000 mg | INTRAVENOUS | Status: DC | PRN

## 2018-06-20 SURGICAL SUPPLY — 48 items
BAG DECANTER FOR FLEXI CONT (MISCELLANEOUS) ×3 IMPLANT
BENZOIN TINCTURE PRP APPL 2/3 (GAUZE/BANDAGES/DRESSINGS) ×3 IMPLANT
BLADE CLIPPER SURG (BLADE) IMPLANT
BUR MATCHSTICK NEURO 3.0 LAGG (BURR) ×3 IMPLANT
BUR PRECISION FLUTE 6.0 (BURR) ×3 IMPLANT
CANISTER SUCT 3000ML PPV (MISCELLANEOUS) ×3 IMPLANT
CARTRIDGE OIL MAESTRO DRILL (MISCELLANEOUS) ×1 IMPLANT
CLOSURE WOUND 1/2 X4 (GAUZE/BANDAGES/DRESSINGS) ×1
COVER WAND RF STERILE (DRAPES) IMPLANT
DIFFUSER DRILL AIR PNEUMATIC (MISCELLANEOUS) ×3 IMPLANT
DRAPE LAPAROTOMY 100X72X124 (DRAPES) ×3 IMPLANT
DRAPE MICROSCOPE LEICA (MISCELLANEOUS) ×3 IMPLANT
DRAPE POUCH INSTRU U-SHP 10X18 (DRAPES) ×3 IMPLANT
DRAPE SURG 17X23 STRL (DRAPES) ×12 IMPLANT
DRSG OPSITE POSTOP 4X6 (GAUZE/BANDAGES/DRESSINGS) ×3 IMPLANT
ELECT BLADE 4.0 EZ CLEAN MEGAD (MISCELLANEOUS) ×3
ELECT REM PT RETURN 9FT ADLT (ELECTROSURGICAL) ×3
ELECTRODE BLDE 4.0 EZ CLN MEGD (MISCELLANEOUS) ×1 IMPLANT
ELECTRODE REM PT RTRN 9FT ADLT (ELECTROSURGICAL) ×1 IMPLANT
GAUZE 4X4 16PLY RFD (DISPOSABLE) IMPLANT
GAUZE SPONGE 4X4 12PLY STRL (GAUZE/BANDAGES/DRESSINGS) ×3 IMPLANT
GLOVE BIO SURGEON STRL SZ8 (GLOVE) ×3 IMPLANT
GLOVE BIO SURGEON STRL SZ8.5 (GLOVE) ×3 IMPLANT
GLOVE EXAM NITRILE XL STR (GLOVE) IMPLANT
GOWN STRL REUS W/ TWL LRG LVL3 (GOWN DISPOSABLE) IMPLANT
GOWN STRL REUS W/ TWL XL LVL3 (GOWN DISPOSABLE) ×1 IMPLANT
GOWN STRL REUS W/TWL 2XL LVL3 (GOWN DISPOSABLE) IMPLANT
GOWN STRL REUS W/TWL LRG LVL3 (GOWN DISPOSABLE)
GOWN STRL REUS W/TWL XL LVL3 (GOWN DISPOSABLE) ×2
HEMOSTAT POWDER SURGIFOAM 1G (HEMOSTASIS) ×3 IMPLANT
KIT BASIN OR (CUSTOM PROCEDURE TRAY) ×3 IMPLANT
KIT TURNOVER KIT B (KITS) ×3 IMPLANT
NEEDLE HYPO 21X1.5 SAFETY (NEEDLE) IMPLANT
NEEDLE HYPO 22GX1.5 SAFETY (NEEDLE) ×3 IMPLANT
NS IRRIG 1000ML POUR BTL (IV SOLUTION) ×3 IMPLANT
OIL CARTRIDGE MAESTRO DRILL (MISCELLANEOUS) ×3
PACK LAMINECTOMY NEURO (CUSTOM PROCEDURE TRAY) ×3 IMPLANT
PAD ARMBOARD 7.5X6 YLW CONV (MISCELLANEOUS) ×9 IMPLANT
PATTIES SURGICAL .5 X1 (DISPOSABLE) IMPLANT
RUBBERBAND STERILE (MISCELLANEOUS) ×6 IMPLANT
SPONGE SURGIFOAM ABS GEL SZ50 (HEMOSTASIS) ×3 IMPLANT
STRIP CLOSURE SKIN 1/2X4 (GAUZE/BANDAGES/DRESSINGS) ×2 IMPLANT
SUT VIC AB 1 CT1 18XBRD ANBCTR (SUTURE) ×1 IMPLANT
SUT VIC AB 1 CT1 8-18 (SUTURE) ×2
SUT VIC AB 2-0 CP2 18 (SUTURE) ×3 IMPLANT
TOWEL GREEN STERILE (TOWEL DISPOSABLE) ×3 IMPLANT
TOWEL GREEN STERILE FF (TOWEL DISPOSABLE) ×3 IMPLANT
WATER STERILE IRR 1000ML POUR (IV SOLUTION) IMPLANT

## 2018-06-20 NOTE — Anesthesia Procedure Notes (Signed)
Procedure Name: Intubation Date/Time: 06/20/2018 3:55 PM Performed by: Eligha Bridegroom, CRNA Pre-anesthesia Checklist: Patient identified, Emergency Drugs available, Suction available, Patient being monitored and Timeout performed Patient Re-evaluated:Patient Re-evaluated prior to induction Oxygen Delivery Method: Circle system utilized Preoxygenation: Pre-oxygenation with 100% oxygen Induction Type: IV induction and Cricoid Pressure applied Ventilation: Mask ventilation without difficulty Laryngoscope Size: Mac and 4 Grade View: Grade II Tube type: Oral Tube size: 7.5 mm Number of attempts: 1 Airway Equipment and Method: Stylet Placement Confirmation: ETT inserted through vocal cords under direct vision,  breath sounds checked- equal and bilateral and positive ETCO2 Secured at: 22 cm Tube secured with: Tape Dental Injury: Teeth and Oropharynx as per pre-operative assessment

## 2018-06-20 NOTE — Anesthesia Postprocedure Evaluation (Signed)
Anesthesia Post Note  Patient: Evan Giles  Procedure(s) Performed: Lumbar 3-4, Lumbar 4-5 Laminectomy/Foraminotomy (N/A Spine Lumbar)     Patient location during evaluation: PACU Anesthesia Type: General Level of consciousness: awake and alert and oriented Pain management: pain level controlled Vital Signs Assessment: post-procedure vital signs reviewed and stable Respiratory status: spontaneous breathing, nonlabored ventilation, respiratory function stable and patient connected to nasal cannula oxygen Cardiovascular status: blood pressure returned to baseline and stable Postop Assessment: no apparent nausea or vomiting Anesthetic complications: no    Last Vitals:  Vitals:   06/20/18 1829 06/20/18 1849  BP: (!) 144/85 (!) 150/56  Pulse: 74 69  Resp: 17 18  Temp:  (!) 36.4 C  SpO2: 99%     Last Pain:  Vitals:   06/20/18 1849  TempSrc: Oral  PainSc:                  Suhaib Guzzo A.

## 2018-06-20 NOTE — Transfer of Care (Signed)
Immediate Anesthesia Transfer of Care Note  Patient: Evan Giles  Procedure(s) Performed: Lumbar 3-4, Lumbar 4-5 Laminectomy/Foraminotomy (N/A Spine Lumbar)  Patient Location: PACU  Anesthesia Type:General  Level of Consciousness: awake and alert   Airway & Oxygen Therapy: Patient Spontanous Breathing and Patient connected to nasal cannula oxygen  Post-op Assessment: Report given to RN and Post -op Vital signs reviewed and stable  Post vital signs: Reviewed and stable  Last Vitals:  Vitals Value Taken Time  BP 135/86 06/20/2018  5:59 PM  Temp    Pulse 76 06/20/2018  6:02 PM  Resp 19 06/20/2018  6:02 PM  SpO2 100 % 06/20/2018  6:02 PM  Vitals shown include unvalidated device data.  Last Pain:  Vitals:   06/20/18 1325  TempSrc:   PainSc: 0-No pain         Complications: No apparent anesthesia complications

## 2018-06-20 NOTE — H&P (Signed)
Subjective: The patient is a 78 year old white male who is complained of back and leg pain consistent with neurogenic claudication.  He has failed medical management and was worked up with a lumbar MRI.  This demonstrated spinal stenosis at L3-4 and L4-5.  I discussed the various treatment options with the patient including surgery.  He has weighed the risks, benefits and alternatives surgery and decided proceed with a lumbar laminectomy.  Past Medical History:  Diagnosis Date  . Diverticulitis   . Hyperlipidemia   . Hypertension   . Lumbar stenosis with neurogenic claudication   . Osteoarthrosis of knee   . Peptic ulcer    years ago    Past Surgical History:  Procedure Laterality Date  . CATARACT EXTRACTION W/ INTRAOCULAR LENS IMPLANT  8/12  . colonoscopy  2010  . dental implant  08/2015  . INGUINAL HERNIA REPAIR Left 05/23/2018   Procedure: LEFT INGUINAL HERNIA REPAIR WITH MESH ERAS PATHWAY;  Surgeon: Jovita Kussmaul, MD;  Location: [facility] ORS;  Service: General;  Laterality: Left;  . JOINT REPLACEMENT Left 06/2011   Left knee---Dr Hooten  . KNEE ARTHROSCOPY Right 09/07/2015   Procedure: ARTHROSCOPY KNEE, PARTIAL MEDIAL AND LATERAL MENISECTOMY, chondroplasty of patella femoral.;  Surgeon: Dereck Leep, MD;  Location: [facility] ORS;  Service: Orthopedics;  Laterality: Right;  . KNEE ARTHROSCOPY Right 09/25/2017   Procedure: ARTHROSCOPY KNEE;  Surgeon: Dereck Leep, MD;  Location: [facility];  Service: Orthopedics;  Laterality: Right;  . KNEE ARTHROSCOPY WITH MEDIAL MENISECTOMY Right 09/25/2017   Procedure: KNEE ARTHROSCOPY WITH MEDIAL MENISECTOMY;  Surgeon: Dereck Leep, MD;  Location: [facility] OR[facility];  Service: Orthopedics;  Laterality: Right;  . MENISCUS DEBRIDEMENT Left 05/2010   Dr Roberts Gaudy  . RETINAL LASER PROCEDURE  4/12   Dr Darrick Grinder  . spinal stenosis surgery  2018   Metal plate  . TONSILLECTOMY AND ADENOIDECTOMY  1966    Allergies  Allergen Reactions  . Hydrocodone Itching     Social History   Tobacco Use  . Smoking status: Never Smoker  . Smokeless tobacco: Never Used  Substance Use Topics  . Alcohol use: No    Alcohol/week: 0.0 standard drinks    Family History  Problem Relation Age of Onset  . Heart disease Father   . Stroke Father        not certain about this  . Cancer Sister        unknown  . Diabetes Maternal Grandmother   . Obesity Sister    Prior to Admission medications   Medication Sig Start Date End Date Taking? Authorizing Provider  acetaminophen (TYLENOL) 500 MG tablet Take 1,000 mg by mouth every 6 (six) hours as needed for moderate pain.   Yes [provider]  aspirin EC 81 MG tablet Take 81 mg by mouth daily.   Yes [provider]  atorvastatin (LIPITOR) 20 MG tablet TAKE 1 TABLET BY MOUTH DAILY 06/12/18  Yes Venia Carbon, MD  Calcium Citrate-Vitamin D (CALCIUM + D PO) Take 1 tablet by mouth daily.    Yes [provider]  losartan-hydrochlorothiazide (HYZAAR) 100-12.5 MG tablet Take 1 tablet by mouth daily. 02/28/18  Yes [provider]  Magnesium 500 MG TABS Take 500 mg by mouth daily.    Yes [provider]  tamsulosin (FLOMAX) 0.4 MG CAPS capsule Take 0.4 mg by mouth every evening.   Yes [provider]  oxyCODONE (OXY IR/ROXICODONE) 5 MG immediate release tablet Take 1-2 tablets (5-10 mg  total) by mouth every 6 (six) hours as needed for moderate pain, severe pain or breakthrough pain. Patient not taking: Reported on 06/05/2018 05/23/18   Autumn Messing III, MD  traMADol (ULTRAM) 50 MG tablet Take 1-2 tablets (50-100 mg total) by mouth every 6 (six) hours as needed. Patient not taking: Reported on 06/05/2018 05/23/18   Autumn Messing III, MD     Review of Systems  Positive ROS: As above  All other systems have been reviewed and were otherwise negative with the exception of those mentioned in the HPI and as above.  Objective: Vital signs in last 24 hours: Temp:  [98 F (36.7 C)] 98  F (36.7 C) (02/26 1300) Pulse Rate:  [81] 81 (02/26 1300) Resp:  [18] 18 (02/26 1300) BP: (142)/(68) 142/68 (02/26 1300) SpO2:  [98 %] 98 % (02/26 1300) Weight:  [102.1 kg] 102.1 kg (02/26 1300) Estimated body mass index is 30.52 kg/m as calculated from the following:   Height as of this encounter: 6' (1.829 m).   Weight as of this encounter: 102.1 kg.   General Appearance: Alert Head: Normocephalic, without obvious abnormality, atraumatic Eyes: PERRL, conjunctiva/corneas clear, EOM's intact,    Ears: Normal  Throat: Normal  Neck: His cervical incision is well-healed.  He has a decreased cervical range of motion Back: unremarkable Lungs: Clear to auscultation bilaterally, respirations unlabored Heart: Regular rate and rhythm, no murmur, rub or gallop Abdomen: Soft, non-tender Extremities: Extremities normal, atraumatic, no cyanosis or edema Skin: unremarkable  NEUROLOGIC:   Mental status: alert and oriented,Motor Exam - grossly normal Sensory Exam - grossly normal Reflexes:  Coordination - grossly normal Gait - grossly normal Balance - grossly normal Cranial Nerves: I: smell Not tested  II: visual acuity  OS: Normal  OD: Normal   II: visual fields Full to confrontation  II: pupils Equal, round, reactive to light  III,VII: ptosis None  III,IV,VI: extraocular muscles  Full ROM  V: mastication Normal  V: facial light touch sensation  Normal  V,VII: corneal reflex  Present  VII: facial muscle function - upper  Normal  VII: facial muscle function - lower Normal  VIII: hearing Not tested  IX: soft palate elevation  Normal  IX,X: gag reflex Present  XI: trapezius strength  5/5  XI: sternocleidomastoid strength 5/5  XI: neck flexion strength  5/5  XII: tongue strength  Normal    Data Review Lab Results  Component Value Date   WBC 7.1 06/13/2018   HGB 13.4 06/13/2018   HCT 43.1 06/13/2018   MCV 90.5 06/13/2018   PLT 301 06/13/2018   Lab Results  Component  Value Date   NA 140 06/13/2018   K 4.1 06/13/2018   CL 106 06/13/2018   CO2 26 06/13/2018   BUN 13 06/13/2018   CREATININE 0.83 06/13/2018   GLUCOSE 97 06/13/2018   Lab Results  Component Value Date   INR 0.9 06/20/2011    Assessment/Plan: L3-4 and L4-5 spinal stenosis, lumbago, lumbar radiculopathy, neurogenic claudication: I have discussed the situation with the patient, his wife and daughter.  We have discussed the various treatment options including surgery.  I have described the surgical treatment option of an L3-4 and L4-5 laminectomy/laminotomy/foraminotomies.  I have shown him surgical models.  I have given him a surgical pamphlet.  We have discussed the risks, benefits, alternatives, expected postoperative course, and likelihood of achieving our goals with surgery.  I have answered all his questions.  He has decided to proceed with surgery.  Ophelia Charter 06/20/2018 3:31 PM

## 2018-06-20 NOTE — Op Note (Signed)
Brief history: The patient is a 78 year old white male who has complained of back and leg pain and weakness consistent with neurogenic claudication.  He failed medical management and was worked up with a lumbar MRI.  This demonstrated spinal stenosis at L3-4 and L4-5.  I discussed the various treatment options.  The patient has decided proceed with surgery after weighing the risks, benefits, and alternatives.  Preop diagnosis L3-4 and L4-5 spinal stenosis, lumbago, lumbar radiculopathy, neurogenic claudication  Postop diagnosis: The same  Procedure: Bilateral L3-4 and L4-5 laminotomy/foraminotomy using microdissection  Surgeon: Dr. Earle Gell  Assistant: Arnetha Massy nurse practitioner  Anesthesia: General endotracheal  Estimated blood loss: Minimal  Specimens: None  Drains: None  Complications: None  Description of procedure: The patient was brought to the operating room by the anesthesia team.  General endotracheal anesthesia was induced.  The patient was turned to the prone position on the Wilson frame.  His lumbosacral region was then prepared with Betadine scrub and Betadine solution.  Sterile drapes were applied.  I then injected the area to be incised with Marcaine with epinephrine solution.  I used the scalpel to make a midline incision over the L3-4 and L4-5 interspaces.  I used electrocautery to perform a bilateral subperiosteal dissection exposing the spinous process and lamina of L3-4 and L4-5 bilaterally.  We confirmed our level using intraoperative x-rays.  I inserted the McCullough retractor for exposure.  I then used the high-speed drill to create bilateral L3-4 and L4-5 laminotomies.  We then brought the operative microscope into the field and under its magnification and illumination completed the microdissection/decompression.  I used a Kerrison punch to widen the laminotomies and to remove the L3-4 and L4-5 ligamentum flavum bilaterally.  We then performed foraminotomies  about the bilateral L4 and L5 nerve roots completing the decompression.  I inspected the intervertebral disc at L3-4 and L4-5 bilaterally.  There were no significant herniations.  We obtained hemostasis using bipolar cautery.  We irrigated the wound out with bacitracin solution.  We then removed the retractor and reapproximated patient's thoracolumbar fascia with interrupted #1 Vicryl suture.  We reapproximated the subcutaneous tissue with interrupted 2-0 Vicryl suture.  We reapproximated the skin with Steri-Strips and benzoin.  A sterile dressing was applied.  The drapes were removed.  The patient was returned to the supine position.  By report all sponge, instrument, and needle counts were correct at the end of this case.

## 2018-06-21 ENCOUNTER — Encounter (HOSPITAL_COMMUNITY): Payer: Self-pay | Admitting: Neurosurgery

## 2018-06-21 DIAGNOSIS — Z885 Allergy status to narcotic agent status: Secondary | ICD-10-CM | POA: Diagnosis not present

## 2018-06-21 DIAGNOSIS — Z833 Family history of diabetes mellitus: Secondary | ICD-10-CM | POA: Diagnosis not present

## 2018-06-21 DIAGNOSIS — E785 Hyperlipidemia, unspecified: Secondary | ICD-10-CM | POA: Diagnosis not present

## 2018-06-21 DIAGNOSIS — Z7982 Long term (current) use of aspirin: Secondary | ICD-10-CM | POA: Diagnosis not present

## 2018-06-21 DIAGNOSIS — Z823 Family history of stroke: Secondary | ICD-10-CM | POA: Diagnosis not present

## 2018-06-21 DIAGNOSIS — Z8711 Personal history of peptic ulcer disease: Secondary | ICD-10-CM | POA: Diagnosis not present

## 2018-06-21 DIAGNOSIS — I10 Essential (primary) hypertension: Secondary | ICD-10-CM | POA: Diagnosis not present

## 2018-06-21 DIAGNOSIS — Z8489 Family history of other specified conditions: Secondary | ICD-10-CM | POA: Diagnosis not present

## 2018-06-21 DIAGNOSIS — M5416 Radiculopathy, lumbar region: Secondary | ICD-10-CM | POA: Diagnosis not present

## 2018-06-21 DIAGNOSIS — M48062 Spinal stenosis, lumbar region with neurogenic claudication: Secondary | ICD-10-CM | POA: Diagnosis not present

## 2018-06-21 DIAGNOSIS — Z79899 Other long term (current) drug therapy: Secondary | ICD-10-CM | POA: Diagnosis not present

## 2018-06-21 DIAGNOSIS — Z8249 Family history of ischemic heart disease and other diseases of the circulatory system: Secondary | ICD-10-CM | POA: Diagnosis not present

## 2018-06-21 DIAGNOSIS — M1710 Unilateral primary osteoarthritis, unspecified knee: Secondary | ICD-10-CM | POA: Diagnosis not present

## 2018-06-21 DIAGNOSIS — Z96652 Presence of left artificial knee joint: Secondary | ICD-10-CM | POA: Diagnosis not present

## 2018-06-21 MED ORDER — OXYCODONE HCL 10 MG PO TABS: 10.0000 mg | ORAL_TABLET | ORAL | 0 refills | Status: DC | PRN

## 2018-06-21 MED ORDER — DOCUSATE SODIUM 100 MG PO CAPS: 100.0000 mg | ORAL_CAPSULE | Freq: Two times a day (BID) | ORAL | 0 refills | Status: DC

## 2018-06-21 MED ORDER — CYCLOBENZAPRINE HCL 10 MG PO TABS: 10.0000 mg | ORAL_TABLET | Freq: Three times a day (TID) | ORAL | 0 refills | Status: DC | PRN

## 2018-06-21 NOTE — Discharge Instructions (Signed)

## 2018-06-21 NOTE — Progress Notes (Signed)
Patient is discharged from room 3C04 at this time. Alert and in stable condition. IV site d/c'd and instructions read to patient and family with understanding verbalized. Left unit via wheelchair with all belongings at side.

## 2018-06-21 NOTE — Discharge Summary (Signed)
Physician Discharge Summary  Patient ID: Evan Giles MRN: [id number] DOB/AGE: 1940-08-01 78 y.o.  Admit date: 06/20/2018 Discharge date: 06/21/2018  Admission Diagnoses: Lumbar spinal stenosis, lumbar radiculopathy, lumbago, neurogenic claudication  Discharge Diagnoses: The same Active Problems:   Degenerative lumbar spinal stenosis   Discharged Condition: good  Hospital Course: I performed bilateral L3-4 and L4-5 laminotomy/foraminotomies on the patient on 06/20/2018.  The surgery went well.  The patient's postoperative course was unremarkable.  On postoperative day #1 he requested discharge to home.  He was given written and oral discharge instructions.  All his questions were answered.  Consults: PT, OT Significant Diagnostic Studies: None Treatments: Bilateral L3-4 and L4-5 laminotomy/foraminotomies using microdissection Discharge Exam: Blood pressure 124/71, pulse 62, temperature 97.8 F (36.6 C), temperature source Oral, resp. rate 16, height 6' (1.829 m), weight 102.1 kg, SpO2 99 %. The patient is alert and pleasant.  He looks well.  His strength is normal.  Disposition: Home  Discharge Instructions    Call MD for:  difficulty breathing, headache or visual disturbances   Complete by:  As directed    Call MD for:  difficulty breathing, headache or visual disturbances   Complete by:  As directed    Call MD for:  extreme fatigue   Complete by:  As directed    Call MD for:  extreme fatigue   Complete by:  As directed    Call MD for:  hives   Complete by:  As directed    Call MD for:  hives   Complete by:  As directed    Call MD for:  persistant dizziness or light-headedness   Complete by:  As directed    Call MD for:  persistant dizziness or light-headedness   Complete by:  As directed    Call MD for:  persistant nausea and vomiting   Complete by:  As directed    Call MD for:  persistant nausea and vomiting   Complete by:  As directed    Call MD for:   redness, tenderness, or signs of infection (pain, swelling, redness, odor or green/yellow discharge around incision site)   Complete by:  As directed    Call MD for:  redness, tenderness, or signs of infection (pain, swelling, redness, odor or green/yellow discharge around incision site)   Complete by:  As directed    Call MD for:  severe uncontrolled pain   Complete by:  As directed    Call MD for:  severe uncontrolled pain   Complete by:  As directed    Call MD for:  temperature >100.4   Complete by:  As directed    Call MD for:  temperature >100.4   Complete by:  As directed    Diet - low sodium heart healthy   Complete by:  As directed    Diet - low sodium heart healthy   Complete by:  As directed    Discharge instructions   Complete by:  As directed    Call [phone] for a followup appointment. Take a stool softener while you are using pain medications.   Discharge instructions   Complete by:  As directed    Call [phone] for a followup appointment. Take a stool softener while you are using pain medications.   Driving Restrictions   Complete by:  As directed    Do not drive for 2 weeks.   Driving Restrictions   Complete by:  As directed    Do not drive for  2 weeks.   Increase activity slowly   Complete by:  As directed    Increase activity slowly   Complete by:  As directed    Lifting restrictions   Complete by:  As directed    Do not lift more than 5 pounds. No excessive bending or twisting.   Lifting restrictions   Complete by:  As directed    Do not lift more than 5 pounds. No excessive bending or twisting.   May shower / Bathe   Complete by:  As directed    Remove the dressing for 3 days after surgery.  You may shower, but leave the incision alone.   May shower / Bathe   Complete by:  As directed    Remove the dressing for 3 days after surgery.  You may shower, but leave the incision alone.   Remove dressing in 48 hours   Complete by:  As directed    Your  stitches are under the scan and will dissolve by themselves. The Steri-Strips will fall off after you take a few showers. Do not rub back or pick at the wound, Leave the wound alone.   Remove dressing in 48 hours   Complete by:  As directed    Your stitches are under the scan and will dissolve by themselves. The Steri-Strips will fall off after you take a few showers. Do not rub back or pick at the wound, Leave the wound alone.        Signed: Ophelia Charter 06/21/2018, 11:04 AM

## 2018-06-21 NOTE — Evaluation (Signed)
Physical Therapy Evaluation and Discharge Patient Details Name: Evan Giles MRN: [id number] DOB: Jul 14, 1940 Today's Date: 06/21/2018   History of Present Illness  Pt is a 78 y/o male who presents s/p L3-L5 laminectomy/decompression on 06/20/2018. PMH significant for HTN, diverticulitis, L TKA, cataract extraction, several R knee arthroscopies.  Clinical Impression  Patient evaluated by Physical Therapy with no further acute PT needs identified. All education has been completed and the patient has no further questions. At the time of PT eval pt was able to perform transfers and ambulation with gross supervision for safety and [facility] for support. Pt was educated on precautions, car transfer, activity progression and positioning recommendations. See below for any follow-up Physical Therapy or equipment needs. PT is signing off. Thank you for this referral.     Follow Up Recommendations No PT follow up;Supervision for mobility/OOB    Equipment Recommendations  None recommended by PT    Recommendations for Other Services       Precautions / Restrictions Precautions Precautions: Back;Fall Precaution Booklet Issued: Yes (comment) Precaution Comments: Reviewed handout in detail with pt for functional mobility and ADL's.  Required Braces or Orthoses: ("no brace needed" order) Restrictions Weight Bearing Restrictions: No      Mobility  Bed Mobility               General bed mobility comments: Pt was received sitting EOB. Verbally reviewed log roll technique.   Transfers Overall transfer level: Needs assistance Equipment used: Straight cane Transfers: Sit to/from Stand Sit to Stand: Supervision         General transfer comment: Light supervision for safety to power-up to full stand. Increased time required but no assist necessary.   Ambulation/Gait Ambulation/Gait assistance: Supervision Gait Distance (Feet): 325 Feet Assistive device: Straight cane Gait  Pattern/deviations: Step-through pattern;Decreased stride length;Trunk flexed Gait velocity: Decreased Gait velocity interpretation: 1.31 - 2.62 ft/sec, indicative of limited community ambulator General Gait Details: VC's for improved posture. Pt with good sequencing with the SPC. Appeared guarded at times but no assist required.   Stairs Stairs: Yes Stairs assistance: Min guard Stair Management: One rail Left;Step to pattern;Forwards Number of Stairs: 5 General stair comments: VC's for sequencing and general safety. No assist required however close guard provided.   Wheelchair Mobility    Modified Rankin (Stroke Patients Only)       Balance Overall balance assessment: Needs assistance Sitting-balance support: Feet supported;No upper extremity supported Sitting balance-Leahy Scale: Fair     Standing balance support: During functional activity;Single extremity supported Standing balance-Leahy Scale: Fair Standing balance comment: Requires SPC for support                             Pertinent Vitals/Pain Pain Assessment: Faces Faces Pain Scale: Hurts little more Pain Location: R hip/back Pain Descriptors / Indicators: Operative site guarding;Discomfort Pain Intervention(s): Limited activity within patient's tolerance;Monitored during session;Repositioned    Home Living Family/patient expects to be discharged to:: Private residence Living Arrangements: Spouse/significant other Available Help at Discharge: Family Type of Home: House Home Access: Stairs to enter   Technical brewer of Steps: 3 Home Layout: One level Home Equipment: Environmental consultant - 2 wheels;Shower seat;Hand held shower head      Prior Function Level of Independence: Independent               Hand Dominance        Extremity/Trunk Assessment   Upper Extremity Assessment Upper Extremity Assessment:  Defer to OT evaluation    Lower Extremity Assessment Lower Extremity Assessment:  RLE deficits/detail RLE Deficits / Details: Decreased strength and muscular endurance consistent with pre-op diagnosis    Cervical / Trunk Assessment Cervical / Trunk Assessment: Other exceptions Cervical / Trunk Exceptions: s/p surgery  Communication   Communication: No difficulties  Cognition Arousal/Alertness: Awake/alert Behavior During Therapy: WFL for tasks assessed/performed Overall Cognitive Status: Within Functional Limits for tasks assessed                                        General Comments      Exercises     Assessment/Plan    PT Assessment Patent does not need any further PT services  PT Problem List Decreased strength;Decreased activity tolerance;Decreased balance;Decreased mobility;Decreased knowledge of use of DME;Decreased safety awareness;Pain       PT Treatment Interventions      PT Goals (Current goals can be found in the Care Plan section)  Acute Rehab PT Goals Patient Stated Goal: Home today PT Goal Formulation: All assessment and education complete, DC therapy Time For Goal Achievement: 07/05/18 Potential to Achieve Goals: Good    Frequency     Barriers to discharge        Co-evaluation               AM-PAC PT "6 Clicks" Mobility  Outcome Measure Help needed turning from your back to your side while in a flat bed without using bedrails?: None Help needed moving from lying on your back to sitting on the side of a flat bed without using bedrails?: None Help needed moving to and from a bed to a chair (including a wheelchair)?: A Little Help needed standing up from a chair using your arms (e.g., wheelchair or bedside chair)?: A Little Help needed to walk in hospital room?: A Little Help needed climbing 3-5 steps with a railing? : A Little 6 Click Score: 20    End of Session Equipment Utilized During Treatment: Gait belt Activity Tolerance: Patient tolerated treatment well Patient left: in chair;with call bell/phone  within reach Nurse Communication: Mobility status PT Visit Diagnosis: Unsteadiness on feet (R26.81);Pain;Difficulty in walking, not elsewhere classified (R26.2) Pain - part of body: (back)    Time: 1314-3888 PT Time Calculation (min) (ACUTE ONLY): 24 min   Charges:   PT Evaluation $PT Eval Moderate Complexity: 1 Mod PT Treatments $Gait Training: 8-22 mins        Rolinda Roan, PT, DPT Acute Rehabilitation Services Pager: [phone] Office: [phone]   Thelma Comp 06/21/2018, 9:38 AM

## 2018-07-02 DIAGNOSIS — H905 Unspecified sensorineural hearing loss: Secondary | ICD-10-CM | POA: Diagnosis not present

## 2018-07-24 DIAGNOSIS — M25561 Pain in right knee: Secondary | ICD-10-CM | POA: Diagnosis not present

## 2018-07-24 DIAGNOSIS — G8929 Other chronic pain: Secondary | ICD-10-CM | POA: Diagnosis not present

## 2018-07-24 DIAGNOSIS — M2391 Unspecified internal derangement of right knee: Secondary | ICD-10-CM | POA: Diagnosis not present

## 2018-08-22 ENCOUNTER — Ambulatory Visit (INDEPENDENT_AMBULATORY_CARE_PROVIDER_SITE_OTHER): Payer: Medicare Other | Admitting: Internal Medicine

## 2018-08-22 ENCOUNTER — Encounter: Payer: Self-pay | Admitting: Internal Medicine

## 2018-08-22 ENCOUNTER — Telehealth: Payer: Self-pay | Admitting: Internal Medicine

## 2018-08-22 VITALS — BP 125/66 | HR 76 | Ht 72.0 in | Wt 225.0 lb

## 2018-08-22 DIAGNOSIS — Z7189 Other specified counseling: Secondary | ICD-10-CM

## 2018-08-22 DIAGNOSIS — N138 Other obstructive and reflux uropathy: Secondary | ICD-10-CM | POA: Insufficient documentation

## 2018-08-22 DIAGNOSIS — N401 Enlarged prostate with lower urinary tract symptoms: Secondary | ICD-10-CM

## 2018-08-22 DIAGNOSIS — Z0000 Encounter for general adult medical examination without abnormal findings: Secondary | ICD-10-CM

## 2018-08-22 DIAGNOSIS — M48061 Spinal stenosis, lumbar region without neurogenic claudication: Secondary | ICD-10-CM | POA: Diagnosis not present

## 2018-08-22 DIAGNOSIS — G959 Disease of spinal cord, unspecified: Secondary | ICD-10-CM | POA: Diagnosis not present

## 2018-08-22 DIAGNOSIS — I10 Essential (primary) hypertension: Secondary | ICD-10-CM

## 2018-08-22 MED ORDER — ATORVASTATIN CALCIUM 20 MG PO TABS: 20.0000 mg | ORAL_TABLET | Freq: Every day | ORAL | 3 refills | Status: DC

## 2018-08-22 MED ORDER — LOSARTAN POTASSIUM-HCTZ 100-12.5 MG PO TABS: 1.0000 | ORAL_TABLET | Freq: Every day | ORAL | 3 refills | Status: DC

## 2018-08-22 NOTE — Telephone Encounter (Signed)
I left a message on patient's voice mail asking him to call back and schedule his AWV for next year.

## 2018-08-22 NOTE — Assessment & Plan Note (Signed)
See social history 

## 2018-08-22 NOTE — Assessment & Plan Note (Signed)
I have personally reviewed the Medicare Annual Wellness questionnaire and have noted 1. The patient's medical and social history 2. Their use of alcohol, tobacco or illicit drugs 3. Their current medications and supplements 4. The patient's functional ability including ADL's, fall risks, home safety risks and hearing or visual             impairment. 5. Diet and physical activities 6. Evidence for depression or mood disorders  The patients weight, height, BMI and visual acuity have been recorded in the chart I have made referrals, counseling and provided education to the patient based review of the above and I have provided the pt with a written personalized care plan for preventive services.  I have provided you with a copy of your personalized plan for preventive services. Please take the time to review along with your updated medication list.  Will do FIT again No PSA due to age Trying to work on fitness---recovering from surgeries Yearly flu vaccine

## 2018-08-22 NOTE — Progress Notes (Signed)
Subjective:    Patient ID: Evan Giles, male    DOB: February 22, 1941, 78 y.o.   MRN: [id number]  HPI Virtual visit for annual wellness visit and follow up of chronic health conditions Identification done Reviewed billing and he gave consent He is at home and I am in my office  Had 4 different surgeries---right knee arthroscopy, cervical spine repair September, left inguinal hernia, then lumbar spine surgery Other doctors--- Dr Gustavus Bryant, Dr Zella Richer, Dr Rosana Fret, Dr Ottelin--urology, Dr Radene Knee, Dr Dodd--dentist, [facility], Dr Ruthe Mannan to do some exercise--had gotten back to the gym until it closed Vision is fine Just got new hearing aides---will need to be adjusted No tobacco No alcohol No falls No depression or anhedonia Memory seems fine Independent with instrumental ADLs  Still has trouble walking Legs get stiff if sitting or in bed--hard to straighten No major pain in legs--but gets pain in back with prolonged standing Feels he may need neurology follow up  Mood problems improved off his sertraline and other medications No regular depression or anhedonia Not an anxious person--but was anxious with his physical symptoms  Saw urologist --got tamsulosin Urine stream okay on this Still with frequent nocturia  No chest pain No palpitations No syncope. May get dizzy if he gets up quick from sitting/lying down No edema No SOB  Current Outpatient Medications on File Prior to Visit  Medication Sig Dispense Refill  . aspirin EC 81 MG tablet Take 81 mg by mouth daily.    Marland Kitchen atorvastatin (LIPITOR) 20 MG tablet TAKE 1 TABLET BY MOUTH DAILY 90 tablet 0  . Calcium Citrate-Vitamin D (CALCIUM + D PO) Take 1 tablet by mouth daily.     Marland Kitchen losartan-hydrochlorothiazide (HYZAAR) 100-12.5 MG tablet Take 1 tablet by mouth daily.    . Magnesium 500 MG TABS Take 500 mg by mouth daily.     . tamsulosin (FLOMAX) 0.4 MG CAPS capsule Take 0.4 mg by  mouth every evening.     No current facility-administered medications on file prior to visit.     Allergies  Allergen Reactions  . Hydrocodone Itching    Past Medical History:  Diagnosis Date  . Diverticulitis   . Hyperlipidemia   . Hypertension   . Lumbar stenosis with neurogenic claudication   . Osteoarthrosis of knee   . Peptic ulcer    years ago    Past Surgical History:  Procedure Laterality Date  . CATARACT EXTRACTION W/ INTRAOCULAR LENS IMPLANT  8/12  . colonoscopy  2010  . dental implant  08/2015  . INGUINAL HERNIA REPAIR Left 05/23/2018   Procedure: LEFT INGUINAL HERNIA REPAIR WITH MESH ERAS PATHWAY;  Surgeon: Jovita Kussmaul, MD;  Location: [facility] OR[facility];  Service: General;  Laterality: Left;  . JOINT REPLACEMENT Left 06/2011   Left knee---Dr Hooten  . KNEE ARTHROSCOPY Right 09/07/2015   Procedure: ARTHROSCOPY KNEE, PARTIAL MEDIAL AND LATERAL MENISECTOMY, chondroplasty of patella femoral.;  Surgeon: Dereck Leep, MD;  Location: [facility] OR[facility];  Service: Orthopedics;  Laterality: Right;  . KNEE ARTHROSCOPY Right 09/25/2017   Procedure: ARTHROSCOPY KNEE;  Surgeon: Dereck Leep, MD;  Location: [facility] OR[facility];  Service: Orthopedics;  Laterality: Right;  . KNEE ARTHROSCOPY WITH MEDIAL MENISECTOMY Right 09/25/2017   Procedure: KNEE ARTHROSCOPY WITH MEDIAL MENISECTOMY;  Surgeon: Dereck Leep, MD;  Location: [facility];  Service: Orthopedics;  Laterality: Right;  . LUMBAR LAMINECTOMY/DECOMPRESSION MICRODISCECTOMY N/A 06/20/2018   Procedure: Lumbar 3-4, Lumbar 4-5 Laminectomy/Foraminotomy;  Surgeon: Newman Pies, MD;  Location: [facility];  Service: Neurosurgery;  Laterality: N/A;  Lumbar 3-4, Lumbar 4-5 Laminectomy/Foraminotomy  . MENISCUS DEBRIDEMENT Left 05/2010   Dr Roberts Gaudy  . RETINAL LASER PROCEDURE  4/12   Dr Darrick Grinder  . spinal stenosis surgery  2018   Metal plate  . TONSILLECTOMY AND ADENOIDECTOMY  1966    Family History  Problem Relation Age of Onset  . Heart disease  Father   . Stroke Father        not certain about this  . Cancer Sister        unknown  . Diabetes Maternal Grandmother   . Obesity Sister     Social History   Socioeconomic History  . Marital status: Married    Spouse name: [name]  . Number of children: 2  . Years of education: 8  . Highest education level: Not on file  Occupational History  . Occupation: Engineer, drilling    Comment: mostly Medicare advantage  Social Needs  . Financial resource strain: Not on file  . Food insecurity:    Worry: Not on file    Inability: Not on file  . Transportation needs:    Medical: Not on file    Non-medical: Not on file  Tobacco Use  . Smoking status: Never Smoker  . Smokeless tobacco: Never Used  Substance and Sexual Activity  . Alcohol use: No    Alcohol/week: 0.0 standard drinks  . Drug use: No  . Sexual activity: Not on file  Lifestyle  . Physical activity:    Days per week: Not on file    Minutes per session: Not on file  . Stress: Not on file  Relationships  . Social connections:    Talks on phone: Not on file    Gets together: Not on file    Attends religious service: Not on file    Active member of club or organization: Not on file    Attends meetings of clubs or organizations: Not on file    Relationship status: Not on file  . Intimate partner violence:    Fear of current or ex partner: Not on file    Emotionally abused: Not on file    Physically abused: Not on file    Forced sexual activity: Not on file  Other Topics Concern  . Not on file  Social History Narrative   I son and 1 daughter   Has living will   Wife is health care POA --- alternate would be son   Would accept resuscitation---but no prolonged ventilation   Probably would accept feeding tube--at least for a limited time   Lives with wife   Review of Systems Appetite is fine Has gained back some of the weight he lost Sleeps fair---gets up every 2-3 hours to void Wears seat belt Some  pain in left shoulder--if he moves it wrong No heartburn or dysphagia No worrisome skin lesions    Objective:   Physical Exam  Constitutional: He is oriented to person, place, and time. He appears well-developed. No distress.  Respiratory: Effort normal. No respiratory distress.  Musculoskeletal:        General: No edema.  Neurological: He is alert and oriented to person, place, and time.  President--- "Evan Giles, Bush" [phone] [id number] Recall 3/3  Psychiatric: He has a normal mood and affect. His behavior is normal.           Assessment & Plan:

## 2018-08-22 NOTE — Progress Notes (Signed)
Hearing Screening Comments: March 2020. New hearing aids.  Vision Screening Comments: March 2020

## 2018-08-22 NOTE — Assessment & Plan Note (Signed)
Some improvement with tamsulosin

## 2018-08-22 NOTE — Assessment & Plan Note (Signed)
Still some symptoms if extended standing

## 2018-08-22 NOTE — Assessment & Plan Note (Signed)
BP Readings from Last 3 Encounters:  08/22/18 125/66  06/21/18 124/73  06/13/18 122/83   Good control No changes needed

## 2018-08-22 NOTE — Assessment & Plan Note (Signed)
Improved since the surgery but still some trouble walking May go back to the neurologist soon for reevaluation

## 2018-08-27 ENCOUNTER — Other Ambulatory Visit: Payer: Self-pay | Admitting: Internal Medicine

## 2018-08-27 ENCOUNTER — Other Ambulatory Visit (INDEPENDENT_AMBULATORY_CARE_PROVIDER_SITE_OTHER): Payer: Medicare Other

## 2018-08-27 DIAGNOSIS — Z1211 Encounter for screening for malignant neoplasm of colon: Secondary | ICD-10-CM

## 2018-08-27 LAB — FECAL OCCULT BLOOD, IMMUNOCHEMICAL: Fecal Occult Bld: NEGATIVE

## 2018-11-09 DIAGNOSIS — M48062 Spinal stenosis, lumbar region with neurogenic claudication: Secondary | ICD-10-CM | POA: Diagnosis not present

## 2018-11-20 DIAGNOSIS — Z86018 Personal history of other benign neoplasm: Secondary | ICD-10-CM | POA: Diagnosis not present

## 2018-11-20 DIAGNOSIS — L578 Other skin changes due to chronic exposure to nonionizing radiation: Secondary | ICD-10-CM | POA: Diagnosis not present

## 2018-11-20 DIAGNOSIS — Z859 Personal history of malignant neoplasm, unspecified: Secondary | ICD-10-CM | POA: Diagnosis not present

## 2018-11-20 DIAGNOSIS — I781 Nevus, non-neoplastic: Secondary | ICD-10-CM | POA: Diagnosis not present

## 2018-11-26 DIAGNOSIS — M545 Low back pain: Secondary | ICD-10-CM | POA: Diagnosis not present

## 2018-11-27 DIAGNOSIS — M9903 Segmental and somatic dysfunction of lumbar region: Secondary | ICD-10-CM | POA: Diagnosis not present

## 2018-11-27 DIAGNOSIS — M9901 Segmental and somatic dysfunction of cervical region: Secondary | ICD-10-CM | POA: Diagnosis not present

## 2018-11-27 DIAGNOSIS — M9902 Segmental and somatic dysfunction of thoracic region: Secondary | ICD-10-CM | POA: Diagnosis not present

## 2018-11-27 DIAGNOSIS — M5136 Other intervertebral disc degeneration, lumbar region: Secondary | ICD-10-CM | POA: Diagnosis not present

## 2018-11-29 DIAGNOSIS — M545 Low back pain: Secondary | ICD-10-CM | POA: Diagnosis not present

## 2018-12-04 DIAGNOSIS — M545 Low back pain: Secondary | ICD-10-CM | POA: Diagnosis not present

## 2018-12-06 DIAGNOSIS — M545 Low back pain: Secondary | ICD-10-CM | POA: Diagnosis not present

## 2018-12-11 DIAGNOSIS — M545 Low back pain: Secondary | ICD-10-CM | POA: Diagnosis not present

## 2018-12-11 DIAGNOSIS — M9902 Segmental and somatic dysfunction of thoracic region: Secondary | ICD-10-CM | POA: Diagnosis not present

## 2018-12-11 DIAGNOSIS — M5136 Other intervertebral disc degeneration, lumbar region: Secondary | ICD-10-CM | POA: Diagnosis not present

## 2018-12-11 DIAGNOSIS — M9901 Segmental and somatic dysfunction of cervical region: Secondary | ICD-10-CM | POA: Diagnosis not present

## 2018-12-11 DIAGNOSIS — M9903 Segmental and somatic dysfunction of lumbar region: Secondary | ICD-10-CM | POA: Diagnosis not present

## 2018-12-13 DIAGNOSIS — M545 Low back pain: Secondary | ICD-10-CM | POA: Diagnosis not present

## 2018-12-18 DIAGNOSIS — M545 Low back pain: Secondary | ICD-10-CM | POA: Diagnosis not present

## 2018-12-19 ENCOUNTER — Other Ambulatory Visit: Payer: Self-pay

## 2018-12-19 NOTE — Patient Outreach (Signed)
[facility]) Care Management  12/19/2018  CHRISSHAWN FOREE 08-12-1940 [id number]   Medication Adherence call to Mr. Agron Nicholes HIPPA Compliant Voice message left with a call back number. Mr. Milia is showing past due on Atorvastatin 20 mg under [place].   [place] Management Direct Dial [phone]  Fax [phone] [email or web address]

## 2018-12-20 DIAGNOSIS — M545 Low back pain: Secondary | ICD-10-CM | POA: Diagnosis not present

## 2018-12-27 DIAGNOSIS — M545 Low back pain: Secondary | ICD-10-CM | POA: Diagnosis not present

## 2019-01-01 DIAGNOSIS — M545 Low back pain: Secondary | ICD-10-CM | POA: Diagnosis not present

## 2019-01-03 ENCOUNTER — Other Ambulatory Visit: Payer: Self-pay

## 2019-01-03 NOTE — Patient Outreach (Signed)
[facility]) Care Management  01/03/2019  Evan Giles 1940/12/22 [id number]   Medication Adherence call to [name] HIPPA Compliant Voice message left with a call back number. [name] is showing past due on Atorvastatin 20 mg and Losartan 100 mg under [place].   [facility] Management Direct Dial [phone]  Fax [phone] [email or web address]

## 2019-01-08 DIAGNOSIS — M9903 Segmental and somatic dysfunction of lumbar region: Secondary | ICD-10-CM | POA: Diagnosis not present

## 2019-01-08 DIAGNOSIS — M5136 Other intervertebral disc degeneration, lumbar region: Secondary | ICD-10-CM | POA: Diagnosis not present

## 2019-01-08 DIAGNOSIS — M545 Low back pain: Secondary | ICD-10-CM | POA: Diagnosis not present

## 2019-01-08 DIAGNOSIS — M9901 Segmental and somatic dysfunction of cervical region: Secondary | ICD-10-CM | POA: Diagnosis not present

## 2019-01-08 DIAGNOSIS — M9902 Segmental and somatic dysfunction of thoracic region: Secondary | ICD-10-CM | POA: Diagnosis not present

## 2019-01-10 ENCOUNTER — Telehealth: Payer: Self-pay

## 2019-01-10 DIAGNOSIS — M545 Low back pain: Secondary | ICD-10-CM | POA: Diagnosis not present

## 2019-01-10 NOTE — Telephone Encounter (Signed)
Arbie Cookey with [facility] left v/m wanting to know if pt was still taking atorvastatin 20 mg or not; pt has not gotten filled in a while and Arbie Cookey did not know if med d/c or pt was taking differently than 1/d.Please advise. Atorvastatin 20 mg # 90 x 3 on 08/22/18.

## 2019-01-11 NOTE — Telephone Encounter (Signed)
Left a message on VM for the pharmacy that they can call back with any questions. It did not say it was [facility] on the recording, but I verified the phone number. I did not leave any pt information.

## 2019-01-11 NOTE — Telephone Encounter (Signed)
Patient said he is taking Atorvastatin. Patient takes 1 pill a day.

## 2019-01-11 NOTE — Telephone Encounter (Signed)
Left detailed message on VM per DPR that I needed to know if he was or was not taking the atorvastatin.

## 2019-01-14 NOTE — Telephone Encounter (Signed)
Spoke to carol from [facility]

## 2019-01-15 DIAGNOSIS — M545 Low back pain: Secondary | ICD-10-CM | POA: Diagnosis not present

## 2019-01-17 DIAGNOSIS — M545 Low back pain: Secondary | ICD-10-CM | POA: Diagnosis not present

## 2019-02-06 DIAGNOSIS — M5136 Other intervertebral disc degeneration, lumbar region: Secondary | ICD-10-CM | POA: Diagnosis not present

## 2019-02-06 DIAGNOSIS — M9903 Segmental and somatic dysfunction of lumbar region: Secondary | ICD-10-CM | POA: Diagnosis not present

## 2019-02-06 DIAGNOSIS — M9902 Segmental and somatic dysfunction of thoracic region: Secondary | ICD-10-CM | POA: Diagnosis not present

## 2019-02-06 DIAGNOSIS — M9901 Segmental and somatic dysfunction of cervical region: Secondary | ICD-10-CM | POA: Diagnosis not present

## 2019-03-08 ENCOUNTER — Other Ambulatory Visit: Payer: Self-pay | Admitting: Orthopedic Surgery

## 2019-03-08 DIAGNOSIS — M2391 Unspecified internal derangement of right knee: Secondary | ICD-10-CM | POA: Diagnosis not present

## 2019-03-08 DIAGNOSIS — M25561 Pain in right knee: Secondary | ICD-10-CM | POA: Diagnosis not present

## 2019-03-08 DIAGNOSIS — G8929 Other chronic pain: Secondary | ICD-10-CM

## 2019-03-20 ENCOUNTER — Ambulatory Visit
Admission: RE | Admit: 2019-03-20 | Discharge: 2019-03-20 | Disposition: A | Discharge status: Home / Self Care | Payer: Medicare Other | Source: Ambulatory Visit | Attending: Orthopedic Surgery | Admitting: Orthopedic Surgery

## 2019-03-20 ENCOUNTER — Other Ambulatory Visit: Payer: Self-pay

## 2019-03-20 DIAGNOSIS — G8929 Other chronic pain: Secondary | ICD-10-CM | POA: Insufficient documentation

## 2019-03-20 DIAGNOSIS — M25561 Pain in right knee: Secondary | ICD-10-CM | POA: Insufficient documentation

## 2019-03-20 DIAGNOSIS — M2391 Unspecified internal derangement of right knee: Secondary | ICD-10-CM | POA: Diagnosis not present

## 2019-04-02 DIAGNOSIS — M2391 Unspecified internal derangement of right knee: Secondary | ICD-10-CM | POA: Diagnosis not present

## 2019-04-07 NOTE — H&P (Signed)
ORTHOPAEDIC HISTORY & PHYSICAL Progress Notes by Evan Giles., MD at 04/02/2019 11:30 AM  Chief Complaint:     Chief Complaint  Patient presents with  . Knee Pain    Right knee internal derangement, MRI 03/20/19    Reason for Visit: The patient is a 78 y.o. male who presents today for reevaluation of his right knee. Heunderwentright knee arthroscopy, partial medial and lateral meniscectomies, and chondroplastyin June 2019. He was noted to have grade III chondromalacia of the medial compartment. He recalls twisting the right knee while getting up from a squatting position several weeks after surgery and had some periodic right knee pain since that time.Approximately 4 weeks ago he had the onset of relatively constant pain that was present with motion, especially at extremes of flexion and extension.The discomfort tends to be worse with pivoting, twisting, or walking on uneven surfaces. He denies any locking of the kneebut he has appreciated significant giving way and buckling. He denies any significant swelling to the knee.Helocalizes most of the pain along the medialaspect of the knee. The patient has not appreciated any significant improvement despite Tylenol, ibuprofen, activity modification, and use of crutches.  Medications: Current Medications        Current Outpatient Medications  Medication Sig Dispense Refill  . atorvastatin (LIPITOR) 20 MG tablet TAKE ONE TABLET BY MOUTH EVERY DAY    . calcium citrate-vitamin D3 (CITRACAL+D) 315-200 mg-unit tablet Take 1 tablet by mouth once daily      . ibuprofen (ADVIL,MOTRIN) 200 MG tablet Take 200 mg by mouth 2 (two) times daily as needed       . losartan-hydrochlorothiazide (HYZAAR) 100-12.5 mg tablet 1 tablet once daily      . magnesium chloride, bulk, Crys Take 1 tablet by mouth once daily      . tamsulosin (FLOMAX) 0.4 mg capsule Take 0.4 mg by mouth once daily Take 30 minutes after same meal each  day.     No current facility-administered medications for this visit.       Allergies:     Allergies  Allergen Reactions  . Hydrocodone Itching    Past Medical History:     Past Medical History:  Diagnosis Date  . Chickenpox   . Hyperlipidemia   . Hypertension     Past Surgical History:      Past Surgical History:  Procedure Laterality Date  . Cervical surgery  12/2017   Dr. Earle Giles  . L3-4, L4-5 laminectomies, foraminotomies  06/20/2018   Dr. Earle Giles  . Left inguinal hernia repair with mesh  05/23/2018   Dr. Autumn Giles  . Left knee arthroscopy  06/02/2010   partial medial meniscectomy, chondroplasty of the medial femoral condyle, medial tibial plateau and patellofemoral articulation  . Left total knee arthroplasty  07/06/2011   Dr. Marry Giles  . Right knee arthroscopy, partial medial and lateral meniscectomies, and chondroplasty  09/07/2015   Dr Evan Giles  . Right knee arthroscopy, partial medial and lateral meniscectomies, and chondroplasty  09/25/2017   Dr Evan Giles  . TONSILLECTOMY  1966    Social History: Social History  Social History        Socioeconomic History  . Marital status: Married    Spouse name: Evan Giles  . Number of children: 2  . Years of education: 16  . Highest education level: Not on file  Occupational History  . Occupation: Animator -Sports coach Employed -Herbalist  Social Needs  . Financial resource strain: Not on file  .  Food insecurity    Worry: Not on file    Inability: Not on file  . Transportation needs    Medical: Not on file    Non-medical: Not on file  Tobacco Use  . Smoking status: Never Smoker  . Smokeless tobacco: Never Used  Substance and Sexual Activity  . Alcohol use: No    Alcohol/week: 0.0 standard drinks  . Drug use: No  . Sexual activity: Yes    Partners: Female  Lifestyle  . Physical activity    Days per week: Not on file    Minutes per session: Not on file   . Stress: Not on file  Relationships  . Social Herbalist on phone: Not on file    Gets together: Not on file    Attends religious service: Not on file    Active member of club or organization: Not on file    Attends meetings of clubs or organizations: Not on file    Relationship status: Not on file  Other Topics Concern  . Not on file  Social History Narrative  . Not on file      Family History:      Family History  Problem Relation Age of Onset  . Heart failure Father     Review of Systems: A comprehensive 14 point ROS was performed, reviewed, and the pertinent orthopaedic findings are documented in the HPI.  Exam BP 142/84   Temp 36.5 C (97.7 F) (Skin)   Ht 182.9 cm (6')   Wt (!) 108.1 kg (238 lb 6.4 oz)   BMI 32.33 kg/m   General:  Well-developed, well-nourished male seen in no acute distress.  Antalgic gait.  No varus or valgus thrust to the right knee.  HEENT:  Atraumatic, normocephalic.  Pupils are equal and reactive to light.  Extraocular motion is intact. Sclera are clear.  Oropharynx is clear with moist mucosa.  Lungs:  Clear to auscultation bilaterally.  Cardiovascular: Regular rate and rhythm.  Normal S1, S2.  No murmur .  No appreciable gallops or rubs. Peripheral pulses are palpable.  No lower extremity edema.  Homan`s test is negative.   Extremities: Good strength, stability, and range of motion of the upper extremities. Good range of motion of the hips and ankles.  Right Knee:        Soft tissue swelling: minimal    Effusion:                   none    Erythema:                 none    Crepitance:               none    Tenderness:             medial    Alignment:                normal    Mediolateral laxity:   stable    Anterior drawer test:negative    Lachman`s test:       negative    McMurray`s test:      positive    Atrophy:                    No significant atrophy.  Quadriceps tone was fair to good.    Range of Motion:     0/0/115 degrees  Neurologic:  Awake, alert, and oriented.  Sensory function is intact to pinprick and light touch.   Motor strength is judged to be 5/5.   Motor coordination is within normal limits.   No apparent clonus. No tremor.    MRI: I reviewed the right knee MRI from [facility] dated 03/21/2019.  I concur with the radiologist's interpretation as below:  MRI OF THE RIGHT KNEE WITHOUT CONTRAST  TECHNIQUE: Multiplanar, multisequence MR imaging of the knee was performed. No intravenous contrast was administered.  COMPARISON: MRIs dated 08/11/2015 and 08/29/2017  FINDINGS: MENISCI  Medial meniscus: There is slight irregularity of the undersurface of the posterior horn of the medial meniscus. Patient has had a partial resection of the posterior horn since the prior exam.  Lateral meniscus: There is a focal tear of the superior surface of the anterior horn with extension to the periphery, similar to the appearance on the prior exam. There are tiny parameniscal cysts adjacent to the tear which are new since the prior study.  LIGAMENTS  Cruciates: ACL is normal. The patient now has extensive mucoid degeneration morning intrasubstance tear of the posterior cruciate ligament, best seen on image 8 of series 12.  Collaterals: Normal.  CARTILAGE  Patellofemoral: Normal.  Medial: Diffuse thinning of the articular cartilage.  Lateral: Small focal area of irregular thinning of the posterior central aspect of the lateral femoral condyle.  Joint: Moderate joint effusion. Slight scarring in Hoffa's fat pad. No plical thickening.  Popliteal Fossa: No Baker cyst. Intact popliteus tendon.  Extensor Mechanism: Intact. Chronic degenerative changes in the distal patellar tendon.  Bones: Tiny new marginal osteophytes in the medial compartment. Tiny chronic marginal osteophytes in the  lateral compartment.  Other: None  IMPRESSION: 1. Progressive or recurrent tear of the superior surface of the anterior horn of the lateral meniscus with extension to the periphery. 2. Slight irregularity of the undersurface of the posterior horn of the medial meniscus. 3. New mucoid degeneration or intrasubstance tear of the posterior cruciate ligament. 4. Moderate joint effusion with slight scarring in Hoffa's fat pad.  Electronically Signed  By: Lorriane Shire M.D.  On: 03/21/2019 12:04   Impression: Internal derangement of the right knee  Plan:   The findings were discussed in detail with the patient. The patient was given informational material on knee arthroscopy. Conservative treatment options were reviewed with the patient.  We discussed the risks and benefits of surgical intervention.  The usual perioperative course was also discussed in detail.  The patient expressed understanding of the risks and benefits of surgical intervention and would like to proceed with plans for right knee arthroscopy.  MEDICAL CLEARANCE: Per anesthesiology. ACTIVITIES:  Avoid pivoting, squatting, or twisting. WORK STATUS: Anticipate out of work for 2-3 weeks. THERAPY: Quadriceps strengthening exercises. MEDICATIONS: Requested Prescriptions    No prescriptions requested or ordered in this encounter   FOLLOW-UP: Return for postoperative follow-up.     Carnel Stegman P. Holley Bouche., M.D.  This note was generated in part with voice recognition software and I apologize for any typographical errors that were not detected and corrected.     Electronically signed by Evan Giles., MD on 04/07/2019 4:05 PM

## 2019-04-08 ENCOUNTER — Other Ambulatory Visit: Payer: Self-pay

## 2019-04-08 ENCOUNTER — Encounter
Admission: RE | Admit: 2019-04-08 | Discharge: 2019-04-08 | Disposition: A | Discharge status: Home / Self Care | Payer: Medicare Other | Source: Ambulatory Visit | Attending: Orthopedic Surgery | Admitting: Orthopedic Surgery

## 2019-04-08 ENCOUNTER — Other Ambulatory Visit: Admission: RE | Admit: 2019-04-08 | Payer: Medicare Other | Source: Ambulatory Visit

## 2019-04-08 DIAGNOSIS — Z20828 Contact with and (suspected) exposure to other viral communicable diseases: Secondary | ICD-10-CM | POA: Diagnosis not present

## 2019-04-08 DIAGNOSIS — I10 Essential (primary) hypertension: Secondary | ICD-10-CM | POA: Diagnosis not present

## 2019-04-08 DIAGNOSIS — Z01818 Encounter for other preprocedural examination: Secondary | ICD-10-CM | POA: Insufficient documentation

## 2019-04-08 LAB — CBC
HCT: 40.7 % (ref 39.0–52.0)
Hemoglobin: 13.2 g/dL (ref 13.0–17.0)
MCH: 28.6 pg (ref 26.0–34.0)
MCHC: 32.4 g/dL (ref 30.0–36.0)
MCV: 88.1 fL (ref 80.0–100.0)
Platelets: 267 10*3/uL (ref 150–400)
RBC: 4.62 MIL/uL (ref 4.22–5.81)
RDW: 12.8 % (ref 11.5–15.5)
WBC: 6.9 10*3/uL (ref 4.0–10.5)
nRBC: 0 % (ref 0.0–0.2)

## 2019-04-08 LAB — BASIC METABOLIC PANEL
Anion gap: 9 (ref 5–15)
BUN: 19 mg/dL (ref 8–23)
CO2: 26 mmol/L (ref 22–32)
Calcium: 8.7 mg/dL — ABNORMAL LOW (ref 8.9–10.3)
Chloride: 103 mmol/L (ref 98–111)
Creatinine, Ser: 0.72 mg/dL (ref 0.61–1.24)
GFR calc Af Amer: >60 mL/min (ref >60)
GFR calc non Af Amer: >60 mL/min (ref >60)
Glucose, Bld: 101 mg/dL — ABNORMAL HIGH (ref 70–99)
Potassium: 4 mmol/L (ref 3.5–5.1)
Sodium: 138 mmol/L (ref 135–145)

## 2019-04-08 LAB — SARS CORONAVIRUS 2 (TAT 6-24 HRS): SARS Coronavirus 2: NEGATIVE

## 2019-04-08 NOTE — Patient Instructions (Signed)
Your procedure is scheduled on: April 10, 2019 Wednesday  Report to Day Surgery on the 2nd floor of the [facility]. To find out your arrival time, please call [phone] between 1PM - 3PM on: April 09, 2019  REMEMBER: Instructions that are not followed completely may result in serious medical risk, up to and including death; or upon the discretion of your surgeon and anesthesiologist your surgery may need to be rescheduled.  Do not eat food after midnight the night before surgery.  No gum chewing, lozengers or hard candies.  You may however, drink CLEAR liquids up to 2 hours before you are scheduled to arrive for your surgery. Do not drink anything within 2 hours of the start of your surgery.  Clear liquids include: - water  - apple juice without pulp - gatorade - black coffee or tea (Do NOT add milk or creamers to the coffee or tea) Do NOT drink anything that is not on this list.  Type 1 and Type 2 diabetics should only drink water.  ENSURE PRE-SURGERY CARBOHYDRATE DRINK:  Complete drinking 2 hours before scheduled to arrive at hospital  No Alcohol for 24 hours before or after surgery.  No Smoking including e-cigarettes for 24 hours prior to surgery.  No chewable tobacco products for at least 6 hours prior to surgery.  No nicotine patches on the day of surgery.  On the morning of surgery brush your teeth with toothpaste and water, you may rinse your mouth with mouthwash if you wish. Do not swallow any toothpaste or mouthwash.  Notify your doctor if there is any change in your medical condition (cold, fever, infection).  Do not wear jewelry, make-up, hairpins, clips or nail polish.  Do not wear lotions, powders, or perfumes.   Do not shave 48 hours prior to surgery.   Contacts and dentures may not be worn into surgery.  Do not bring valuables to the hospital, including drivers license, insurance or credit cards.  [facility] is not responsible for any belongings  or valuables.   TAKE THESE MEDICATIONS THE MORNING OF SURGERY: atorvastatin  Use CHG Soap or wipes as directed on instruction sheet.  Follow recommendations from Cardiologist, Pulmonologist or PCP regarding stopping Aspirin. ASPIRIN IS ON HOLD  Stop Anti-inflammatories (NSAIDS) such as Advil, Aleve, Ibuprofen, Motrin, Naproxen, Naprosyn and Aspirin based products such as Excedrin, Goodys Powder, BC Powder. (May take Tylenol or Acetaminophen if needed.)  Stop ANY OVER THE COUNTER supplements until after surgery. (May continue Vitamin D, Vitamin B, and multivitamin.)  Wear comfortable clothing (specific to your surgery type) to the hospital.  Plan for stool softeners for home use.  If you are being discharged the day of surgery, you will not be allowed to drive home. You will need a responsible adult to drive you home and stay with you that night.   If you are taking public transportation, you will need to have a responsible adult with you. Please confirm with your physician that it is acceptable to use public transportation.   Please call [phone] if you have any questions about these instructions.

## 2019-04-09 MED ORDER — CEFAZOLIN SODIUM-DEXTROSE 2-4 GM/100ML-% IV SOLN
2.0000 g | INTRAVENOUS | Status: AC
  Administered 2019-04-10: 2 g via INTRAVENOUS

## 2019-04-10 ENCOUNTER — Encounter
Admission: RE | Disposition: A | Discharge status: Home / Self Care | Payer: Self-pay | Source: Home / Self Care | Attending: Orthopedic Surgery

## 2019-04-10 ENCOUNTER — Ambulatory Visit: Payer: Medicare Other | Admitting: Diagnostic Neuroimaging

## 2019-04-10 ENCOUNTER — Ambulatory Visit
Admission: RE | Admit: 2019-04-10 | Discharge: 2019-04-10 | Disposition: A | Discharge status: Home / Self Care | Payer: Medicare Other | Attending: Orthopedic Surgery | Admitting: Orthopedic Surgery

## 2019-04-10 ENCOUNTER — Ambulatory Visit: Payer: Medicare Other | Admitting: Certified Registered Nurse Anesthetist

## 2019-04-10 ENCOUNTER — Other Ambulatory Visit: Payer: Self-pay

## 2019-04-10 ENCOUNTER — Encounter: Payer: Self-pay | Admitting: Orthopedic Surgery

## 2019-04-10 DIAGNOSIS — E785 Hyperlipidemia, unspecified: Secondary | ICD-10-CM | POA: Diagnosis not present

## 2019-04-10 DIAGNOSIS — I10 Essential (primary) hypertension: Secondary | ICD-10-CM | POA: Insufficient documentation

## 2019-04-10 DIAGNOSIS — X501XXA Overexertion from prolonged static or awkward postures, initial encounter: Secondary | ICD-10-CM | POA: Insufficient documentation

## 2019-04-10 DIAGNOSIS — M94261 Chondromalacia, right knee: Secondary | ICD-10-CM | POA: Diagnosis not present

## 2019-04-10 DIAGNOSIS — S83241A Other tear of medial meniscus, current injury, right knee, initial encounter: Secondary | ICD-10-CM | POA: Insufficient documentation

## 2019-04-10 DIAGNOSIS — Z96652 Presence of left artificial knee joint: Secondary | ICD-10-CM | POA: Insufficient documentation

## 2019-04-10 DIAGNOSIS — M23221 Derangement of posterior horn of medial meniscus due to old tear or injury, right knee: Secondary | ICD-10-CM | POA: Diagnosis not present

## 2019-04-10 DIAGNOSIS — Z9889 Other specified postprocedural states: Secondary | ICD-10-CM

## 2019-04-10 DIAGNOSIS — M2391 Unspecified internal derangement of right knee: Secondary | ICD-10-CM | POA: Diagnosis not present

## 2019-04-10 HISTORY — PX: KNEE ARTHROSCOPY WITH MEDIAL MENISECTOMY: SHX5651

## 2019-04-10 HISTORY — PX: CHONDROPLASTY: SHX5177

## 2019-04-10 SURGERY — CHONDROPLASTY
Anesthesia: General | Site: Knee | Laterality: Right

## 2019-04-10 MED ORDER — DEXAMETHASONE SODIUM PHOSPHATE 10 MG/ML IJ SOLN
INTRAMUSCULAR | Status: AC
  Filled 2019-04-10: qty 1

## 2019-04-10 MED ORDER — OXYCODONE HCL 5 MG PO TABS: 5.0000 mg | ORAL_TABLET | ORAL | 0 refills | Status: DC | PRN

## 2019-04-10 MED ORDER — PROPOFOL 10 MG/ML IV BOLUS
INTRAVENOUS | Status: DC | PRN
  Administered 2019-04-10: 150 mg via INTRAVENOUS

## 2019-04-10 MED ORDER — GLYCOPYRROLATE 0.2 MG/ML IJ SOLN
INTRAMUSCULAR | Status: DC | PRN
  Administered 2019-04-10: .2 mg via INTRAVENOUS

## 2019-04-10 MED ORDER — KETAMINE HCL 50 MG/ML IJ SOLN
INTRAMUSCULAR | Status: DC | PRN
  Administered 2019-04-10 (×2): 25 mg via INTRAVENOUS

## 2019-04-10 MED ORDER — GLYCOPYRROLATE 0.2 MG/ML IJ SOLN
INTRAMUSCULAR | Status: AC
  Filled 2019-04-10: qty 1

## 2019-04-10 MED ORDER — ACETAMINOPHEN 10 MG/ML IV SOLN
INTRAVENOUS | Status: AC
  Filled 2019-04-10: qty 100

## 2019-04-10 MED ORDER — ONDANSETRON HCL 4 MG/2ML IJ SOLN
INTRAMUSCULAR | Status: AC
  Filled 2019-04-10: qty 2

## 2019-04-10 MED ORDER — MORPHINE SULFATE (PF) 4 MG/ML IV SOLN
INTRAVENOUS | Status: AC
  Filled 2019-04-10: qty 1

## 2019-04-10 MED ORDER — FAMOTIDINE 20 MG PO TABS: 20.0000 mg | ORAL_TABLET | Freq: Once | ORAL | Status: AC

## 2019-04-10 MED ORDER — PROPOFOL 10 MG/ML IV BOLUS
INTRAVENOUS | Status: AC
  Filled 2019-04-10: qty 20

## 2019-04-10 MED ORDER — BUPIVACAINE-EPINEPHRINE (PF) 0.25% -1:200000 IJ SOLN
INTRAMUSCULAR | Status: AC
  Filled 2019-04-10: qty 30

## 2019-04-10 MED ORDER — OXYCODONE HCL 5 MG PO TABS: 5.0000 mg | ORAL_TABLET | Freq: Once | ORAL | Status: DC | PRN

## 2019-04-10 MED ORDER — MIDAZOLAM HCL 2 MG/2ML IJ SOLN
INTRAMUSCULAR | Status: AC
  Filled 2019-04-10: qty 2

## 2019-04-10 MED ORDER — LIDOCAINE HCL (CARDIAC) PF 100 MG/5ML IV SOSY
PREFILLED_SYRINGE | INTRAVENOUS | Status: DC | PRN
  Administered 2019-04-10: 100 mg via INTRAVENOUS

## 2019-04-10 MED ORDER — FAMOTIDINE 20 MG PO TABS
ORAL_TABLET | ORAL | Status: AC
  Administered 2019-04-10: 20 mg via ORAL
  Filled 2019-04-10: qty 1

## 2019-04-10 MED ORDER — ONDANSETRON HCL 4 MG/2ML IJ SOLN
INTRAMUSCULAR | Status: DC | PRN
  Administered 2019-04-10: 4 mg via INTRAVENOUS

## 2019-04-10 MED ORDER — FENTANYL CITRATE (PF) 100 MCG/2ML IJ SOLN: 25.0000 ug | INTRAMUSCULAR | Status: DC | PRN

## 2019-04-10 MED ORDER — FENTANYL CITRATE (PF) 250 MCG/5ML IJ SOLN
INTRAMUSCULAR | Status: AC
  Filled 2019-04-10: qty 5

## 2019-04-10 MED ORDER — EPHEDRINE SULFATE 50 MG/ML IJ SOLN
INTRAMUSCULAR | Status: DC | PRN
  Administered 2019-04-10 (×2): 5 mg via INTRAVENOUS

## 2019-04-10 MED ORDER — OXYCODONE HCL 5 MG/5ML PO SOLN: 5.0000 mg | Freq: Once | ORAL | Status: DC | PRN

## 2019-04-10 MED ORDER — CEFAZOLIN SODIUM-DEXTROSE 2-4 GM/100ML-% IV SOLN
INTRAVENOUS | Status: AC
  Filled 2019-04-10: qty 100

## 2019-04-10 MED ORDER — CELECOXIB 200 MG PO CAPS: 400.0000 mg | ORAL_CAPSULE | Freq: Once | ORAL | Status: AC

## 2019-04-10 MED ORDER — ACETAMINOPHEN 10 MG/ML IV SOLN
INTRAVENOUS | Status: DC | PRN
  Administered 2019-04-10: 1000 mg via INTRAVENOUS

## 2019-04-10 MED ORDER — CHLORHEXIDINE GLUCONATE 4 % EX LIQD
60.0000 mL | Freq: Once | CUTANEOUS | Status: AC
  Administered 2019-04-10: 4 via TOPICAL

## 2019-04-10 MED ORDER — SUGAMMADEX SODIUM 200 MG/2ML IV SOLN
INTRAVENOUS | Status: AC
  Filled 2019-04-10: qty 2

## 2019-04-10 MED ORDER — EPHEDRINE SULFATE 50 MG/ML IJ SOLN
INTRAMUSCULAR | Status: AC
  Filled 2019-04-10: qty 1

## 2019-04-10 MED ORDER — LACTATED RINGERS IV SOLN: INTRAVENOUS | Status: DC

## 2019-04-10 MED ORDER — MORPHINE SULFATE 4 MG/ML IJ SOLN
INTRAMUSCULAR | Status: DC | PRN
  Administered 2019-04-10: 4 mg

## 2019-04-10 MED ORDER — CELECOXIB 200 MG PO CAPS
ORAL_CAPSULE | ORAL | Status: AC
  Administered 2019-04-10: 400 mg via ORAL
  Filled 2019-04-10: qty 2

## 2019-04-10 MED ORDER — MIDAZOLAM HCL 2 MG/2ML IJ SOLN
INTRAMUSCULAR | Status: DC | PRN
  Administered 2019-04-10: 1 mg via INTRAVENOUS

## 2019-04-10 MED ORDER — KETAMINE HCL 50 MG/ML IJ SOLN
INTRAMUSCULAR | Status: AC
  Filled 2019-04-10: qty 10

## 2019-04-10 MED ORDER — LIDOCAINE HCL (PF) 2 % IJ SOLN
INTRAMUSCULAR | Status: AC
  Filled 2019-04-10: qty 10

## 2019-04-10 MED ORDER — FENTANYL CITRATE (PF) 100 MCG/2ML IJ SOLN
INTRAMUSCULAR | Status: DC | PRN
  Administered 2019-04-10: 25 ug via INTRAVENOUS
  Administered 2019-04-10: 50 ug via INTRAVENOUS
  Administered 2019-04-10: 25 ug via INTRAVENOUS

## 2019-04-10 MED ORDER — BUPIVACAINE-EPINEPHRINE 0.25% -1:200000 IJ SOLN
INTRAMUSCULAR | Status: DC | PRN
  Administered 2019-04-10: 25 mL
  Administered 2019-04-10: 5 mL

## 2019-04-10 SURGICAL SUPPLY — 27 items
BLADE SHAVER 4.5 DBL SERAT CV (CUTTER) IMPLANT
COVER WAND RF STERILE (DRAPES) ×3 IMPLANT
CUFF TOURN SGL QUICK 24 (TOURNIQUET CUFF)
CUFF TOURN SGL QUICK 30 (TOURNIQUET CUFF)
CUFF TRNQT CYL 24X4X16.5-23 (TOURNIQUET CUFF) IMPLANT
CUFF TRNQT CYL 30X4X21-28X (TOURNIQUET CUFF) IMPLANT
DRSG DERMACEA 8X12 NADH (GAUZE/BANDAGES/DRESSINGS) ×3 IMPLANT
DURAPREP 26ML APPLICATOR (WOUND CARE) ×6 IMPLANT
GAUZE SPONGE 4X4 12PLY STRL (GAUZE/BANDAGES/DRESSINGS) ×3 IMPLANT
GLOVE BIOGEL M STRL SZ7.5 (GLOVE) ×3 IMPLANT
GLOVE INDICATOR 8.0 STRL GRN (GLOVE) ×3 IMPLANT
GOWN STRL REUS W/ TWL LRG LVL3 (GOWN DISPOSABLE) ×2 IMPLANT
GOWN STRL REUS W/TWL LRG LVL3 (GOWN DISPOSABLE) ×4
IV LACTATED RINGER IRRG 3000ML (IV SOLUTION) ×12
IV LR IRRIG 3000ML ARTHROMATIC (IV SOLUTION) ×6 IMPLANT
KIT TURNOVER KIT A (KITS) ×3 IMPLANT
MANIFOLD NEPTUNE II (INSTRUMENTS) ×3 IMPLANT
PACK ARTHROSCOPY KNEE (MISCELLANEOUS) ×3 IMPLANT
SET TUBE SUCT SHAVER OUTFL 24K (TUBING) ×3 IMPLANT
SET TUBE TIP INTRA-ARTICULAR (MISCELLANEOUS) ×3 IMPLANT
SOL PREP PVP 2OZ (MISCELLANEOUS) ×3
SOLUTION PREP PVP 2OZ (MISCELLANEOUS) ×1 IMPLANT
SUT ETHILON 3-0 FS-10 30 BLK (SUTURE) ×3
SUTURE EHLN 3-0 FS-10 30 BLK (SUTURE) ×1 IMPLANT
TUBING ARTHRO INFLOW-ONLY STRL (TUBING) ×3 IMPLANT
WAND HAND CNTRL MULTIVAC 50 (MISCELLANEOUS) ×3 IMPLANT
WRAP KNEE W/COLD PACKS 25.5X14 (SOFTGOODS) ×3 IMPLANT

## 2019-04-10 NOTE — Discharge Instructions (Signed)
AMBULATORY SURGERY  DISCHARGE INSTRUCTIONS   1) The drugs that you were given will stay in your system until tomorrow so for the next 24 hours you should not:  A) Drive an automobile B) Make any legal decisions C) Drink any alcoholic beverage   2) You may resume regular meals tomorrow.  Today it is better to start with liquids and gradually work up to solid foods.  You may eat anything you prefer, but it is better to start with liquids, then soup and crackers, and gradually work up to solid foods.   3) Please notify your doctor immediately if you have any unusual bleeding, trouble breathing, redness and pain at the surgery site, drainage, fever, or pain not relieved by medication. 4)   5) Your post-operative visit with Dr.                                     is: Date:                        Time:    Please call to schedule your post-operative visit.  6) Additional Instructions:       Instructions after Knee Arthroscopy    James P. Hooten, Jr., M.D.     Dept. of Orthopaedics & Sports Medicine  [facility]  [address]  [place], [place]  [address]   Phone: [phone]   Fax: [phone]   DIET: . Drink plenty of non-alcoholic fluids & begin a light diet. . Resume your normal diet the day after surgery.  ACTIVITY:  . You may use crutches or a walker with weight-bearing as tolerated, unless instructed otherwise. . You may wean yourself off of the walker or crutches as tolerated.  . Begin doing gentle exercises. Exercising will reduce the pain and swelling, increase motion, and prevent muscle weakness.   . Avoid strenuous activities or athletics for a minimum of 4-6 weeks after arthroscopic surgery. . Do not drive or operate any equipment until instructed.  WOUND CARE:  . Place one to two pillows under the knee the first day or two when sitting or lying.  . Continue to use the ice packs periodically to reduce pain and swelling. . The small incisions in  your knee are closed with nylon stitches. The stitches will be removed in the office. . The bulky dressing may be removed on the second day after surgery. DO NOT TOUCH THE STITCHES. Put a Band-Aid over each stitch. Do NOT use any ointments or creams on the incisions.  . You may bathe or shower after the stitches are removed at the first office visit following surgery.  MEDICATIONS: . You may resume your regular medications. . Please take the pain medication as prescribed. . Do not take pain medication on an empty stomach. . Do not drive or drink alcoholic beverages when taking pain medications.  CALL THE OFFICE FOR: . Temperature above 101 degrees . Excessive bleeding or drainage on the dressing. . Excessive swelling, coldness, or paleness of the toes. . Persistent nausea and vomiting.  FOLLOW-UP:  . You should have an appointment to return to the office in 7-10 days after surgery.      [facility] Department Directory         [email or web address]       [email or web address]          Cardiology  Appointments: [place] -   [phone] [place] - [phone]  Endocrinology  Appointments: [place] - [phone] [place] - [phone]  Gastroenterology  Appointments: [place] - [phone] [place] - [phone]        General Surgery   Appointments: [place] - [phone]  Internal Medicine/Family Medicine  Appointments: [place] - [phone] [place] - [phone] [place] - [phone]  Metabolic and Weigh Loss Surgery  Appointments: [place] - [phone]        Neurology  Appointments: [place] - [phone] [place] - [phone]  Neurosurgery  Appointments: [place] - [phone]  Obstetrics & Gynecology  Appointments: [place] - [phone] [place] - [phone]        Pediatrics  Appointments: [place] - [phone] [place] - [phone]  Physiatry  Appointments: [place]  [phone]  Physical Therapy  Appointments: [place] - [phone] [place] - [phone]        Podiatry  Appointments: [place] - [phone] [place] - [phone]  Pulmonology  Appointments: [place] - [phone]  Rheumatology  Appointments: [place] - [phone]        [place] Location: [facility]  [address] [place], [place]  [address]  [place] Location: [facility] [address] [place], [place]  [address]  [place] Location: [facility] [address] [place], [place]  [address]    

## 2019-04-10 NOTE — Op Note (Signed)
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   ASSISTANT: none  ANESTHESIA: general  ESTIMATED BLOOD LOSS: Minimal  FLUIDS REPLACED: 700 mL of crystalloid  TOURNIQUET TIME: Not used  INDICATIONS FOR SURGERY: FENNER PROBUS is a 78 y.o. year old male who has been seen for complaints of right knee pain. MRI demonstrated findings consistent with meniscal pathology. After discussion of the risks and benefits of surgical intervention, the patient expressed understanding of the risks benefits and agree with plans for right knee arthroscopy.   PROCEDURE IN DETAIL: The patient was brought into the operating room and, after adequate general anesthesia was achieved, a tourniquet was applied to the right thigh and the leg was placed in the leg holder. All bony prominences were well padded. The patient's right knee was cleaned and prepped with alcohol and Duraprep and draped in the usual sterile fashion. A "timeout" was performed as per usual protocol. The anticipated portal sites were injected with 0.25% Marcaine with epinephrine. An anterolateral incision was made and a cannula was inserted. A moderate effusion was evacuated and the knee was distended with fluid using the pump. The scope was advanced down the medial gutter into the medial compartment. Under visualization with the scope, an anteromedial portal was created and a hooked probe was inserted. The medial meniscus was visualized and probed.  There was a horizontal tear of  the posterior horn of the medial meniscus.  The tear was debrided using meniscal punches and a 4.5 mm shaver.  Additional contouring was performed using the 50 degree ArthroCare wand.  The articular cartilage was visualized.  There were grade 3 changes of chondromalacia primarily involving the medial femoral condyle.  These changes were consistent with findings from his previous surgery.  Several areas of progressive changes were debrided and contoured using the ArthroCare wand.  The scope was then advanced into the intercondylar notch. The anterior cruciate ligament was visualized and probed and felt to be intact. The scope was removed from the lateral portal and reinserted via the anteromedial portal to better visualize the lateral compartment. The lateral meniscus was visualized and probed.  It was evidence of degenerative fraying of the anterior horn of the lateral meniscus.  No gross tear was appreciated.  The areas of fraying were debrided and contoured using the ArthroCare wand.  The articular cartilage of the lateral compartment was visualized. Some minor changes of chondromalacia were noted primarily involving the lateral tibial plateau.  These areas were debrided using a ArthroCare wand.   Finally, the scope was advanced so as to visualize the patellofemoral articulation. Good patellar tracking was appreciated.  There was an area involving the intercondylar sulcus with moderate degenerative changes.  These areas were debrided and contoured using the ArthroCare wand.  The knee was irrigated with copius amounts of fluid and suctioned dry. The anterolateral portal was re-approximated with #3-0 nylon. A combination of 0.25% Marcaine with epinephrine and 4 mg of Morphine were injected via the scope. The scope was removed and the anteromedial portal was re-approximated with #  3-0 nylon. A sterile dressing was applied followed by application of an ice wrap.  The patient tolerated the procedure well and was  transported to the PACU in stable condition.  Jaymie Mckiddy P. Holley Bouche., M.D.

## 2019-04-10 NOTE — H&P (Signed)
The patient has been re-examined, and the chart reviewed, and there have been no interval changes to the documented history and physical.    The risks, benefits, and alternatives have been discussed at length. The patient expressed understanding of the risks benefits and agreed with plans for surgical intervention.  Shayana Hornstein P. Aidenn Skellenger, Jr. M.D.    

## 2019-04-10 NOTE — Transfer of Care (Signed)
Immediate Anesthesia Transfer of Care Note  Patient: Evan Giles  Procedure(s) Performed: CHONDROPLASTY (Right Knee) KNEE ARTHROSCOPY WITH PARTIAL MEDIAL MENISECTOMY (Right Knee)  Patient Location: PACU  Anesthesia Type:General  Level of Consciousness: awake, alert , oriented and patient cooperative  Airway & Oxygen Therapy: Patient Spontanous Breathing and Patient connected to nasal cannula oxygen  Post-op Assessment: Report given to RN and Post -op Vital signs reviewed and stable  Post vital signs: Reviewed and stable  Last Vitals:  Vitals Value Taken Time  BP    Temp    Pulse 72 04/10/19 1032  Resp 13 04/10/19 1032  SpO2 99 % 04/10/19 1032  Vitals shown include unvalidated device data.  Last Pain:  Vitals:   04/10/19 0712  TempSrc:   PainSc: 6          Complications: No apparent anesthesia complications

## 2019-04-10 NOTE — Anesthesia Preprocedure Evaluation (Addendum)
Anesthesia Evaluation  Patient identified by MRN, date of birth, ID band Patient awake    Reviewed: Allergy & Precautions, H&P , NPO status , Patient's Chart, lab work & pertinent test results  Airway Mallampati: III  TM Distance: >3 FB     Dental  (+) Teeth Intact   Pulmonary neg pulmonary ROS,           Cardiovascular hypertension,      Neuro/Psych negative neurological ROS  negative psych ROS   GI/Hepatic Neg liver ROS, PUD,   Endo/Other  negative endocrine ROS  Renal/GU      Musculoskeletal  (+) Arthritis ,   Abdominal   Peds  Hematology negative hematology ROS (+)   Anesthesia Other Findings Obesity  Past Medical History: No date: Diverticulitis No date: Hyperlipidemia No date: Hypertension No date: Lumbar stenosis with neurogenic claudication No date: Osteoarthrosis of knee No date: Peptic ulcer     Comment:  years ago  Past Surgical History: 11/2010: CATARACT EXTRACTION W/ INTRAOCULAR LENS IMPLANT; Left 2010: colonoscopy 08/2015: dental implant 05/23/2018: INGUINAL HERNIA REPAIR; Left     Comment:  Procedure: LEFT INGUINAL HERNIA REPAIR WITH MESH ERAS               PATHWAY;  Surgeon: Jovita Kussmaul, MD;  Location: [facility]               ORS;  Service: General;  Laterality: Left; 06/2011: JOINT REPLACEMENT; Left     Comment:  Left knee---Dr Hooten 09/07/2015: KNEE ARTHROSCOPY; Right     Comment:  Procedure: ARTHROSCOPY KNEE, PARTIAL MEDIAL AND LATERAL               MENISECTOMY, chondroplasty of patella femoral.;  Surgeon:              Dereck Leep, MD;  Location: [facility] ORS;  Service:               Orthopedics;  Laterality: Right; 09/25/2017: KNEE ARTHROSCOPY; Right     Comment:  Procedure: ARTHROSCOPY KNEE;  Surgeon: Dereck Leep,               MD;  Location: [facility] ORS;  Service: Orthopedics;                Laterality: Right; 09/25/2017: KNEE ARTHROSCOPY WITH MEDIAL MENISECTOMY; Right     Comment:   Procedure: KNEE ARTHROSCOPY WITH MEDIAL MENISECTOMY;                Surgeon: Dereck Leep, MD;  Location: [facility] ORS;                Service: Orthopedics;  Laterality: Right; 06/20/2018: LUMBAR LAMINECTOMY/DECOMPRESSION MICRODISCECTOMY; N/A     Comment:  Procedure: Lumbar 3-4, Lumbar 4-5               Laminectomy/Foraminotomy;  Surgeon: Newman Pies, MD;              Location: [facility] [place];  Service: Neurosurgery;  Laterality:               N/A;  Lumbar 3-4, Lumbar 4-5 Laminectomy/Foraminotomy 05/2010: MENISCUS DEBRIDEMENT; Left     Comment:  Dr Roberts Gaudy 07/2010: RETINAL LASER PROCEDURE; Left     Comment:  Dr Darrick Grinder 2018: spinal stenosis surgery     Comment:  Metal plate 1966: TONSILLECTOMY AND ADENOIDECTOMY     Reproductive/Obstetrics negative OB ROS  Anesthesia Physical Anesthesia Plan  ASA: II  Anesthesia Plan: General LMA   Post-op Pain Management:    Induction:   PONV Risk Score and Plan: Dexamethasone, Ondansetron and Treatment may vary due to age or medical condition  Airway Management Planned:   Additional Equipment:   Intra-op Plan:   Post-operative Plan:   Informed Consent: I have reviewed the patients History and Physical, chart, labs and discussed the procedure including the risks, benefits and alternatives for the proposed anesthesia with the patient or authorized representative who has indicated his/her understanding and acceptance.     Dental Advisory Given  Plan Discussed with: Anesthesiologist  Anesthesia Plan Comments:         Anesthesia Quick Evaluation

## 2019-04-10 NOTE — Anesthesia Procedure Notes (Signed)
Procedure Name: LMA Insertion Date/Time: 04/10/2019 8:37 AM Performed by: Bernardo Heater, CRNA Pre-anesthesia Checklist: Patient identified, Patient being monitored, Timeout performed, Emergency Drugs available and Suction available Patient Re-evaluated:Patient Re-evaluated prior to induction Oxygen Delivery Method: Circle system utilized Preoxygenation: Pre-oxygenation with 100% oxygen Induction Type: IV induction LMA: LMA inserted LMA Size: 5.0 Tube type: Oral Number of attempts: 2 Placement Confirmation: positive ETCO2 and breath sounds checked- equal and bilateral Tube secured with: Tape Dental Injury: Teeth and Oropharynx as per pre-operative assessment

## 2019-04-10 NOTE — Anesthesia Post-op Follow-up Note (Signed)
Anesthesia QCDR form completed.        

## 2019-04-11 NOTE — Anesthesia Postprocedure Evaluation (Signed)
Anesthesia Post Note  Patient: Evan Giles  Procedure(s) Performed: CHONDROPLASTY (Right Knee) KNEE ARTHROSCOPY WITH PARTIAL MEDIAL MENISECTOMY (Right Knee)  Patient location during evaluation: PACU Anesthesia Type: General Level of consciousness: awake and alert Pain management: pain level controlled Vital Signs Assessment: post-procedure vital signs reviewed and stable Respiratory status: spontaneous breathing, nonlabored ventilation and respiratory function stable Cardiovascular status: blood pressure returned to baseline and stable Postop Assessment: no apparent nausea or vomiting Anesthetic complications: no     Last Vitals:  Vitals:   04/10/19 1113 04/10/19 1143  BP: 114/70 (!) 124/50  Pulse: (!) 58 (!) 53  Resp: 16   Temp: (!) 36.3 C   SpO2: 99% 100%    Last Pain:  Vitals:   04/10/19 1143  TempSrc:   PainSc: 0-No pain                 Durenda Hurt

## 2019-04-18 DIAGNOSIS — Z9889 Other specified postprocedural states: Secondary | ICD-10-CM | POA: Diagnosis not present

## 2019-04-18 DIAGNOSIS — M5416 Radiculopathy, lumbar region: Secondary | ICD-10-CM | POA: Diagnosis not present

## 2019-05-01 DIAGNOSIS — I10 Essential (primary) hypertension: Secondary | ICD-10-CM | POA: Diagnosis not present

## 2019-05-01 DIAGNOSIS — M4712 Other spondylosis with myelopathy, cervical region: Secondary | ICD-10-CM | POA: Diagnosis not present

## 2019-05-01 DIAGNOSIS — M48062 Spinal stenosis, lumbar region with neurogenic claudication: Secondary | ICD-10-CM | POA: Diagnosis not present

## 2019-05-01 DIAGNOSIS — M4316 Spondylolisthesis, lumbar region: Secondary | ICD-10-CM | POA: Diagnosis not present

## 2019-05-14 ENCOUNTER — Ambulatory Visit: Payer: Medicare Other | Admitting: Diagnostic Neuroimaging

## 2019-05-14 ENCOUNTER — Encounter: Payer: Self-pay | Admitting: Diagnostic Neuroimaging

## 2019-05-14 ENCOUNTER — Other Ambulatory Visit: Payer: Self-pay

## 2019-05-14 VITALS — BP 122/67 | HR 79 | Temp 97.1°F | Ht 72.0 in | Wt 240.8 lb

## 2019-05-14 DIAGNOSIS — R200 Anesthesia of skin: Secondary | ICD-10-CM

## 2019-05-14 DIAGNOSIS — G9520 Unspecified cord compression: Secondary | ICD-10-CM | POA: Diagnosis not present

## 2019-05-14 DIAGNOSIS — R531 Weakness: Secondary | ICD-10-CM

## 2019-05-14 NOTE — Progress Notes (Signed)
[facility]  PATIENT: Evan Giles DOB: [birth year]  REFERRING CLINICIAN: Silvio Pate HISTORY FROM: patient and wife  REASON FOR VISIT: follow up    HISTORICAL  CHIEF COMPLAINT:  Chief Complaint  Patient presents with  . Weakness    rm 7, one year FU wife- [name] "hands/feet numbness went away but came back, I am dropping things, sensation of cold in arms/legs went away but came back"    HISTORY OF PRESENT ILLNESS:   UPDATE (05/14/19, VRP): Since last visit, had neck surgery in Sept 2019, and then lumbar spine surgery in Feb 2020. Symptoms improved for a while, but now numbness in hands and legs returned in last few months. No alleviating or aggravating factors.    PRIOR HPI (12/19/17): 79 year old male here for evaluation of numbness and sensory loss.  2 months ago patient had gradual onset of staggering, balance difficulty, feet and legs feeling heavy.  He was also having difficulty raising his arms up in the air.  Sometimes he would have tingling sensation in his arms and legs.  Symptoms have progressively worsened.  No specific triggering or aggravating factors.  No prodromal accidents injuries or traumas.   REVIEW OF SYSTEMS: Full 14 system review of systems performed and negative with exception of: as per HPI.   ALLERGIES: Allergies  Allergen Reactions  . Hydrocodone Itching    HOME MEDICATIONS: Outpatient Medications Prior to Visit  Medication Sig Dispense Refill  . aspirin EC 81 MG tablet Take 81 mg by mouth daily.    Marland Kitchen atorvastatin (LIPITOR) 20 MG tablet Take 1 tablet (20 mg total) by mouth daily. 90 tablet 3  . Calcium Citrate-Vitamin D (CALCIUM + D PO) Take 1 tablet by mouth daily.     Marland Kitchen losartan-hydrochlorothiazide (HYZAAR) 100-12.5 MG tablet Take 1 tablet by mouth daily. 90 tablet 3  . Magnesium 250 MG TABS Take 250 mg by mouth daily.     Marland Kitchen oxyCODONE (ROXICODONE) 5 MG immediate release tablet Take 1-2 tablets (5-10 mg total) by mouth every  4 (four) hours as needed for severe pain. 15 tablet 0  . tamsulosin (FLOMAX) 0.4 MG CAPS capsule Take 0.4 mg by mouth every evening.     No facility-administered medications prior to visit.    PAST MEDICAL HISTORY: Past Medical History:  Diagnosis Date  . Diverticulitis   . Hard of hearing   . Hyperlipidemia   . Hypertension   . Lumbar stenosis with neurogenic claudication   . Osteoarthrosis of knee   . Peptic ulcer    years ago    PAST SURGICAL HISTORY: Past Surgical History:  Procedure Laterality Date  . CATARACT EXTRACTION W/ INTRAOCULAR LENS IMPLANT Left 11/2010  . CHONDROPLASTY Right 04/10/2019   Procedure: CHONDROPLASTY;  Surgeon: Dereck Leep, MD;  Location: [facility];  Service: Orthopedics;  Laterality: Right;  . colonoscopy  2010  . dental implant  08/2015  . INGUINAL HERNIA REPAIR Left 05/23/2018   Procedure: LEFT INGUINAL HERNIA REPAIR WITH MESH ERAS PATHWAY;  Surgeon: Jovita Kussmaul, MD;  Location: [facility];  Service: General;  Laterality: Left;  . JOINT REPLACEMENT Left 06/2011   Left knee---Dr Hooten  . KNEE ARTHROSCOPY Right 09/07/2015   Procedure: ARTHROSCOPY KNEE, PARTIAL MEDIAL AND LATERAL MENISECTOMY, chondroplasty of patella femoral.;  Surgeon: Dereck Leep, MD;  Location: [facility];  Service: Orthopedics;  Laterality: Right;  . KNEE ARTHROSCOPY Right 09/25/2017   Procedure: ARTHROSCOPY KNEE;  Surgeon: Dereck Leep, MD;  Location: [facility];  Service: Orthopedics;  Laterality: Right;  . KNEE ARTHROSCOPY WITH MEDIAL MENISECTOMY Right 09/25/2017   Procedure: KNEE ARTHROSCOPY WITH MEDIAL MENISECTOMY;  Surgeon: Dereck Leep, MD;  Location: [facility];  Service: Orthopedics;  Laterality: Right;  . KNEE ARTHROSCOPY WITH MEDIAL MENISECTOMY Right 04/10/2019   Procedure: KNEE ARTHROSCOPY WITH PARTIAL MEDIAL MENISECTOMY;  Surgeon: Dereck Leep, MD;  Location: [facility];  Service: Orthopedics;  Laterality: Right;  . LUMBAR LAMINECTOMY/DECOMPRESSION MICRODISCECTOMY  N/A 06/20/2018   Procedure: Lumbar 3-4, Lumbar 4-5 Laminectomy/Foraminotomy;  Surgeon: Newman Pies, MD;  Location: [place];  Service: Neurosurgery;  Laterality: N/A;  Lumbar 3-4, Lumbar 4-5 Laminectomy/Foraminotomy  . MENISCUS DEBRIDEMENT Left 05/2010   Dr Roberts Gaudy  . RETINAL LASER PROCEDURE Left 07/2010   Dr Darrick Grinder  . spinal stenosis surgery  2018, 01/10/18, 06/20/18   Metal plate  . TONSILLECTOMY AND ADENOIDECTOMY  1966    FAMILY HISTORY: Family History  Problem Relation Age of Onset  . Heart disease Father   . Stroke Father        not certain about this  . Cancer Sister        unknown  . Diabetes Maternal Grandmother   . Obesity Sister     SOCIAL HISTORY: Social History   Socioeconomic History  . Marital status: Married    Spouse name: [name]  . Number of children: 2  . Years of education: 50  . Highest education level: Not on file  Occupational History  . Occupation: Engineer, drilling    Comment: mostly Medicare advantage  Tobacco Use  . Smoking status: Never Smoker  . Smokeless tobacco: Never Used  Substance and Sexual Activity  . Alcohol use: No    Alcohol/week: 0.0 standard drinks  . Drug use: No  . Sexual activity: Not on file  Other Topics Concern  . Not on file  Social History Narrative   I son and 1 daughter   Has living will   Wife is health care POA --- alternate would be son   Would accept resuscitation---but no prolonged ventilation   Probably would accept feeding tube--at least for a limited time   Lives with wife   Social Determinants of Health   Financial Resource Strain:   . Difficulty of Paying Living Expenses: Not on file  Food Insecurity:   . Worried About Charity fundraiser in the Last Year: Not on file  . Ran Out of Food in the Last Year: Not on file  Transportation Needs:   . Lack of Transportation (Medical): Not on file  . Lack of Transportation (Non-Medical): Not on file  Physical Activity:   . Days of Exercise  per Week: Not on file  . Minutes of Exercise per Session: Not on file  Stress:   . Feeling of Stress : Not on file  Social Connections:   . Frequency of Communication with Friends and Family: Not on file  . Frequency of Social Gatherings with Friends and Family: Not on file  . Attends Religious Services: Not on file  . Active Member of Clubs or Organizations: Not on file  . Attends Archivist Meetings: Not on file  . Marital Status: Not on file  Intimate Partner Violence:   . Fear of Current or Ex-Partner: Not on file  . Emotionally Abused: Not on file  . Physically Abused: Not on file  . Sexually Abused: Not on file     PHYSICAL EXAM  GENERAL EXAM/CONSTITUTIONAL: Vitals:  Vitals:   05/14/19  0750  BP: 122/67  Pulse: 79  Temp: (!) 97.1 F (36.2 C)  Weight: 240 lb 12.8 oz (109.2 kg)  Height: 6' (1.829 m)   Body mass index is 32.66 kg/m. Wt Readings from Last 3 Encounters:  05/14/19 240 lb 12.8 oz (109.2 kg)  04/08/19 245 lb (111.1 kg)  08/22/18 225 lb (102.1 kg)    Patient is in no distress; well developed, nourished and groomed; neck is supple  SMALL FORAMEN (3MM) NEAR THE UPPER BUTTOCKS IN THE MIDLINE; SURROUNDING ERYTHEMA AND INTERTRIGO DRAINAGE  CARDIOVASCULAR:  Examination of carotid arteries is normal; no carotid bruits  Regular rate and rhythm, no murmurs  Examination of peripheral vascular system by observation and palpation is normal  EYES:  Ophthalmoscopic exam of optic discs and posterior segments is normal; no papilledema or hemorrhages No exam data present  MUSCULOSKELETAL:  Gait, strength, tone, movements noted in Neurologic exam below  NEUROLOGIC: MENTAL STATUS:  No flowsheet data found.  awake, alert, oriented to person, place and time  recent and remote memory intact  normal attention and concentration  language fluent, comprehension intact, naming intact  fund of knowledge appropriate  CRANIAL NERVE:   2nd - no  papilledema on fundoscopic exam  2nd, 3rd, 4th, 6th - pupils equal and reactive to light, visual fields full to confrontation, extraocular muscles intact, no nystagmus  5th - facial sensation symmetric  7th - facial strength symmetric  8th - hearing intact  9th - palate elevates symmetrically, uvula midline  11th - shoulder shrug symmetric  12th - tongue protrusion midline  MOTOR:   normal bulk and tone  BUE (DELTOID 3+, BICEPS 4, TRICEPS 4, GRIP 5)  BLE (HIP FLEXION 4; OTHERWISE 5)  SENSORY:   normal and symmetric to light touch  DECR VIB AND TEMP IN FEET  DECR PP IN FINGERTIPS AND TOES / FEET; IN GRADIENT UP TO BODY  COORDINATION:   finger-nose-finger, fine finger movements SLOW; MILD ATAXIA  REFLEXES:   deep tendon reflexes --> BUE BICEPS 0, BRACHIORAD 0, TRICEPS 2; BLE KNEES 2, ANKLES 2  GAIT/STATION:   CAUTIOUS WIDE GAIT; UNSTEADY; ROMBERG POSITIVE; CANNOT WALK ON TOES; ABLE TO WALK ON HEELS     DIAGNOSTIC DATA (LABS, IMAGING, TESTING) - I reviewed patient records, labs, notes, testing and imaging myself where available.  Lab Results  Component Value Date   WBC 6.9 04/08/2019   HGB 13.2 04/08/2019   HCT 40.7 04/08/2019   MCV 88.1 04/08/2019   PLT 267 04/08/2019      Component Value Date/Time   NA 138 04/08/2019 1149   NA 137 07/08/2011 0336   K 4.0 04/08/2019 1149   K 4.0 07/08/2011 0336   CL 103 04/08/2019 1149   CL 99 07/08/2011 0336   CO2 26 04/08/2019 1149   CO2 28 07/08/2011 0336   GLUCOSE 101 (H) 04/08/2019 1149   GLUCOSE 116 (H) 07/08/2011 0336   BUN 19 04/08/2019 1149   BUN 12 07/08/2011 0336   CREATININE 0.72 04/08/2019 1149   CREATININE 0.85 07/08/2011 0336   CALCIUM 8.7 (L) 04/08/2019 1149   CALCIUM 8.5 07/08/2011 0336   PROT 6.9 02/19/2018 1504   ALBUMIN 4.3 02/19/2018 1504   AST 15 02/19/2018 1504   ALT 9 02/19/2018 1504   ALKPHOS 72 02/19/2018 1504   BILITOT 0.5 02/19/2018 1504   GFRNONAA >60 04/08/2019 1149    GFRNONAA >60 07/08/2011 0336   GFRAA >60 04/08/2019 [place] >60 07/08/2011 [id number]  Lab Results  Component Value Date   CHOL 181 04/21/2017   HDL 33.30 (L) 04/21/2017   LDLCALC 110 (H) 04/21/2017   LDLDIRECT 122.0 04/14/2015   TRIG 185.0 (H) 04/21/2017   CHOLHDL 5 04/21/2017   Lab Results  Component Value Date   HGBA1C 6.4 12/01/2017   Lab Results  Component Value Date   VITAMINB12 470 12/01/2017   Lab Results  Component Value Date   TSH 1.38 02/19/2018    08/29/17 MR right knee [I reviewed images myself and agree with interpretation. Traumatic changes as noted. -VRP]  1. Complex tear of the posterior horn of the medial meniscus. 2. Degeneration of the anterior horn and body of the lateral meniscus with an oblique signal component involving the anterior horn-body junction extending to the superior articular surface concerning for a tear. 3. Tricompartmental cartilage abnormalities as described above.  12/18/17 MRI cervical spine [I reviewed images myself and agree with interpretation. -VRP]  1. Minimal grade 1 C3-4 and C4-5 anterolisthesis on a degenerative basis. Severe RIGHT C3-4 facet arthropathy with acute reactive changes. 2. Severe canal stenosis C3-4 with cord compression and mild cord edema. 3. Severe C3-4 through C6-7 neural foraminal narrowing.  12/18/17 MRI lumbar spine [I reviewed images myself and agree with interpretation. -VRP]  1. Facet arthropathy at L3-4 and below with grade 1 anterolisthesis at L3-4. Disc degeneration, focally advanced at L4-5. 2. Reported history of spina bifida. No significant dysraphism is noted. No conus abnormality or tethering mass. 3. L3-4 moderate spinal stenosis. 4. Foraminal stenosis on the right at L4-5 bilaterally at L3-4.  06/06/18 MRI brain / IAC - No abnormality seen to explain the presenting symptoms. No vestibular schwannoma. - Normal appearance of the brain for age, with only very minimal small vessel change of the  hemispheric white matter, less than usually seen at this age.   12/07/17 EMG/NCS - Widespread axonal sensorimotor polyneuropathy.  Sensory nerve responses are slightly more affected than motor responses. - Active and chronic denervation noted in muscles of right upper and right lower extremity.  Myopathic recruitment also noted in right deltoid.     ASSESSMENT AND PLAN  79 y.o. year old male here with new onset numbness, weakness in arms and legs starting in June 2019.  Found to have spinal stenosis in cervical and lumbar regions; now s/p surgeries (2019, 2020).  Dx:   1. Numbness   2. Weakness   3. Cervical spinal cord compression (HCC)      PLAN:  NUMBNESS / WEAKNESS (arms/legs; due to cervical and lumbar spine dz; also with widespread axonal neuropathy --> idiopathic) - use cane; caution with balance and walking - follow up with neurosurgery   Return for return to PCP, pending if symptoms worsen or fail to improve.    Penni Bombard, MD [id number], [id number] AM Certified in Neurology, Neurophysiology and Neuroimaging  [facility] [address], [place], [place] [address] [phone]

## 2019-05-17 DIAGNOSIS — R202 Paresthesia of skin: Secondary | ICD-10-CM | POA: Insufficient documentation

## 2019-07-08 ENCOUNTER — Other Ambulatory Visit: Payer: Self-pay

## 2019-07-08 NOTE — Patient Outreach (Signed)
[facility]) Care Management  07/08/2019  LEBRON HELFERICH 1940/06/27 [id number]   Medication Adherence call to Mr. Nihit Hoying Telephone call to Patient regarding Medication Adherence unable to reach patient. Mr. Vantassell is showing pats due on Atorvastatin 20 mg and Losartan/Hctz 100/12.5 mg under [place].   [facility] Direct Dial [phone]  Fax [phone] [email or web address]

## 2019-08-15 DIAGNOSIS — H2511 Age-related nuclear cataract, right eye: Secondary | ICD-10-CM | POA: Diagnosis not present

## 2019-08-23 ENCOUNTER — Encounter: Payer: Self-pay | Admitting: Internal Medicine

## 2019-08-23 ENCOUNTER — Other Ambulatory Visit: Payer: Self-pay

## 2019-08-23 ENCOUNTER — Ambulatory Visit (INDEPENDENT_AMBULATORY_CARE_PROVIDER_SITE_OTHER): Payer: Medicare Other | Admitting: Internal Medicine

## 2019-08-23 VITALS — BP 128/78 | HR 72 | Temp 98.0°F | Ht 70.5 in | Wt 243.0 lb

## 2019-08-23 DIAGNOSIS — N138 Other obstructive and reflux uropathy: Secondary | ICD-10-CM

## 2019-08-23 DIAGNOSIS — G959 Disease of spinal cord, unspecified: Secondary | ICD-10-CM | POA: Diagnosis not present

## 2019-08-23 DIAGNOSIS — M48061 Spinal stenosis, lumbar region without neurogenic claudication: Secondary | ICD-10-CM

## 2019-08-23 DIAGNOSIS — I10 Essential (primary) hypertension: Secondary | ICD-10-CM

## 2019-08-23 DIAGNOSIS — Z7189 Other specified counseling: Secondary | ICD-10-CM

## 2019-08-23 DIAGNOSIS — E785 Hyperlipidemia, unspecified: Secondary | ICD-10-CM | POA: Diagnosis not present

## 2019-08-23 DIAGNOSIS — Z0000 Encounter for general adult medical examination without abnormal findings: Secondary | ICD-10-CM

## 2019-08-23 DIAGNOSIS — Z1211 Encounter for screening for malignant neoplasm of colon: Secondary | ICD-10-CM

## 2019-08-23 DIAGNOSIS — N401 Enlarged prostate with lower urinary tract symptoms: Secondary | ICD-10-CM

## 2019-08-23 NOTE — Assessment & Plan Note (Signed)
Hard to tell if leg symptoms are cervical or lumbar

## 2019-08-23 NOTE — Assessment & Plan Note (Signed)
Doing well with the tamsulosin

## 2019-08-23 NOTE — Assessment & Plan Note (Signed)
See social history 

## 2019-08-23 NOTE — Assessment & Plan Note (Signed)
No problems with primary prevention with the statin

## 2019-08-23 NOTE — Progress Notes (Signed)
Subjective:    Patient ID: Evan Giles, male    DOB: January 03, 1941, 79 y.o.   MRN: [id number]  HPI Here for Medicare wellness visit and follow up of chronic health conditions This visit occurred during the SARS-CoV-2 public health emergency.  Safety protocols were in place, including screening questions prior to the visit, additional usage of staff PPE, and extensive cleaning of exam room while observing appropriate contact time as indicated for disinfecting solutions.   Reviewed form and advanced directives Reviewed other doctors No alcohol or tobacco Having vision problems--due for cataract removal on right now Has hearing aides Fell once while outside--missed slight step. No sig injury Not depressed now--not anhedonic Independent with instrumental ADLs  Dr Arnoldo Morale is proposed another surgery for cervical spine Wants to put in rod for stabilization Still has abnormal leg sensations and stiffness/weakness after sitting or lying for a while  No palpitations No chest pain No SOB No edema  Continues on statin No apparent side effects  Urine flow is good on the tamsulosin No sig urgency  No incontinence  Current Outpatient Medications on File Prior to Visit  Medication Sig Dispense Refill  . aspirin EC 81 MG tablet Take 81 mg by mouth daily.    Marland Kitchen atorvastatin (LIPITOR) 20 MG tablet Take 1 tablet (20 mg total) by mouth daily. 90 tablet 3  . Calcium Citrate-Vitamin D (CALCIUM + D PO) Take 1 tablet by mouth daily.     Marland Kitchen losartan-hydrochlorothiazide (HYZAAR) 100-12.5 MG tablet Take 1 tablet by mouth daily. 90 tablet 3  . Magnesium 250 MG TABS Take 250 mg by mouth daily.     . tamsulosin (FLOMAX) 0.4 MG CAPS capsule Take 0.4 mg by mouth every evening.     No current facility-administered medications on file prior to visit.    Allergies  Allergen Reactions  . Hydrocodone Itching    Past Medical History:  Diagnosis Date  . Diverticulitis   . Hard of hearing   .  Hyperlipidemia   . Hypertension   . Lumbar stenosis with neurogenic claudication   . Osteoarthrosis of knee   . Peptic ulcer    years ago    Past Surgical History:  Procedure Laterality Date  . CATARACT EXTRACTION W/ INTRAOCULAR LENS IMPLANT Left 11/2010  . CHONDROPLASTY Right 04/10/2019   Procedure: CHONDROPLASTY;  Surgeon: Dereck Leep, MD;  Location: [facility];  Service: Orthopedics;  Laterality: Right;  . colonoscopy  2010  . dental implant  08/2015  . INGUINAL HERNIA REPAIR Left 05/23/2018   Procedure: LEFT INGUINAL HERNIA REPAIR WITH MESH ERAS PATHWAY;  Surgeon: Jovita Kussmaul, MD;  Location: [facility];  Service: General;  Laterality: Left;  . JOINT REPLACEMENT Left 06/2011   Left knee---Dr Hooten  . KNEE ARTHROSCOPY Right 09/07/2015   Procedure: ARTHROSCOPY KNEE, PARTIAL MEDIAL AND LATERAL MENISECTOMY, chondroplasty of patella femoral.;  Surgeon: Dereck Leep, MD;  Location: [facility];  Service: Orthopedics;  Laterality: Right;  . KNEE ARTHROSCOPY Right 09/25/2017   Procedure: ARTHROSCOPY KNEE;  Surgeon: Dereck Leep, MD;  Location: [facility];  Service: Orthopedics;  Laterality: Right;  . KNEE ARTHROSCOPY WITH MEDIAL MENISECTOMY Right 09/25/2017   Procedure: KNEE ARTHROSCOPY WITH MEDIAL MENISECTOMY;  Surgeon: Dereck Leep, MD;  Location: [facility];  Service: Orthopedics;  Laterality: Right;  . KNEE ARTHROSCOPY WITH MEDIAL MENISECTOMY Right 04/10/2019   Procedure: KNEE ARTHROSCOPY WITH PARTIAL MEDIAL MENISECTOMY;  Surgeon: Dereck Leep, MD;  Location: [facility];  Service: Orthopedics;  Laterality: Right;  . LUMBAR LAMINECTOMY/DECOMPRESSION MICRODISCECTOMY N/A 06/20/2018   Procedure: Lumbar 3-4, Lumbar 4-5 Laminectomy/Foraminotomy;  Surgeon: Newman Pies, MD;  Location: [place];  Service: Neurosurgery;  Laterality: N/A;  Lumbar 3-4, Lumbar 4-5 Laminectomy/Foraminotomy  . MENISCUS DEBRIDEMENT Left 05/2010   Dr Roberts Gaudy  . RETINAL LASER PROCEDURE Left 07/2010   Dr  Darrick Grinder  . spinal stenosis surgery  2018, 01/10/18, 06/20/18   Metal plate  . TONSILLECTOMY AND ADENOIDECTOMY  1966    Family History  Problem Relation Age of Onset  . Heart disease Father   . Stroke Father        not certain about this  . Cancer Sister        unknown  . Diabetes Maternal Grandmother   . Obesity Sister     Social History   Socioeconomic History  . Marital status: Married    Spouse name: [name]  . Number of children: 2  . Years of education: 60  . Highest education level: Not on file  Occupational History  . Occupation: Engineer, drilling    Comment: mostly Medicare advantage  Tobacco Use  . Smoking status: Never Smoker  . Smokeless tobacco: Never Used  Substance and Sexual Activity  . Alcohol use: No    Alcohol/week: 0.0 standard drinks  . Drug use: No  . Sexual activity: Not on file  Other Topics Concern  . Not on file  Social History Narrative   I son and 1 daughter   Has living will   Wife is health care POA --- alternate would be son   Would accept resuscitation---but no prolonged ventilation   Probably would accept feeding tube--at least for a limited time   Lives with wife   Social Determinants of Health   Financial Resource Strain:   . Difficulty of Paying Living Expenses:   Food Insecurity:   . Worried About Charity fundraiser in the Last Year:   . Arboriculturist in the Last Year:   Transportation Needs:   . Film/video editor (Medical):   Marland Kitchen Lack of Transportation (Non-Medical):   Physical Activity:   . Days of Exercise per Week:   . Minutes of Exercise per Session:   Stress:   . Feeling of Stress :   Social Connections:   . Frequency of Communication with Friends and Family:   . Frequency of Social Gatherings with Friends and Family:   . Attends Religious Services:   . Active Member of Clubs or Organizations:   . Attends Archivist Meetings:   Marland Kitchen Marital Status:   Intimate Partner Violence:   . Fear of  Current or Ex-Partner:   . Emotionally Abused:   Marland Kitchen Physically Abused:   . Sexually Abused:    Review of Systems No other major ortho issues Still has dizziness --like in gym if does lifting and then gets up Appetite is "too good" Has put back on weight--working on it Sleeps "so-so". Wakes up sometimes---but able to get back to sleep Occasional mild heartburn--tums helps. No dysphagia Bowels are fine. No blood No suspicious skin lesions Wears seat belt Teeth are okay    Objective:   Physical Exam  Constitutional: He is oriented to person, place, and time. He appears well-developed. No distress.  HENT:  Mouth/Throat: Oropharynx is clear and moist.  No oral lesions  Neck: No thyromegaly present.  Cardiovascular: Normal rate, regular rhythm, normal heart sounds and intact distal pulses. Exam reveals no gallop.  No murmur heard. Respiratory: Breath sounds normal. No respiratory distress. He has no wheezes. He has no rales.  GI: Soft. There is no abdominal tenderness.  Musculoskeletal:        General: No tenderness or edema.  Lymphadenopathy:    He has no cervical adenopathy.  Neurological: He is alert and oriented to person, place, and time.  President--- "Biden, Trump, osama--no---Bush, Clinton, Bush" [id number] D-l-r-[id number]-w Recall 3/3  Skin: No rash noted. No erythema.  Psychiatric: He has a normal mood and affect. His behavior is normal.           Assessment & Plan:

## 2019-08-23 NOTE — Assessment & Plan Note (Signed)
BP Readings from Last 3 Encounters:  08/23/19 128/78  05/14/19 122/67  04/10/19 (!) 124/50   Good control Will continue the losartan/HCTZ Recent labs okay

## 2019-08-23 NOTE — Assessment & Plan Note (Signed)
Better since the surgery but still with leg symptoms Urged caution in considering more surgery

## 2019-08-23 NOTE — Progress Notes (Signed)
Hearing Screening   125Hz 250Hz 500Hz 1000Hz 2000Hz 3000Hz 4000Hz 6000Hz 8000Hz  Right ear:           Left ear:           Comments: Has hearing aids. Wearing them today.  Vision Screening Comments: April 2021   

## 2019-08-23 NOTE — Assessment & Plan Note (Signed)
I have personally reviewed the Medicare Annual Wellness questionnaire and have noted 1. The patient's medical and social history 2. Their use of alcohol, tobacco or illicit drugs 3. Their current medications and supplements 4. The patient's functional ability including ADL's, fall risks, home safety risks and hearing or visual             impairment. 5. Diet and physical activities 6. Evidence for depression or mood disorders  The patients weight, height, BMI and visual acuity have been recorded in the chart I have made referrals, counseling and provided education to the patient based review of the above and I have provided the pt with a written personalized care plan for preventive services.  I have provided you with a copy of your personalized plan for preventive services. Please take the time to review along with your updated medication list.  Will do FIT again No PSA due to age Trying to exercise Yearly flu vaccine Consider shingrix at pharmacy

## 2019-08-28 ENCOUNTER — Other Ambulatory Visit (INDEPENDENT_AMBULATORY_CARE_PROVIDER_SITE_OTHER): Payer: Medicare Other

## 2019-08-28 DIAGNOSIS — Z1211 Encounter for screening for malignant neoplasm of colon: Secondary | ICD-10-CM

## 2019-08-28 LAB — FECAL OCCULT BLOOD, IMMUNOCHEMICAL: Fecal Occult Bld: NEGATIVE

## 2019-09-13 ENCOUNTER — Other Ambulatory Visit: Payer: Self-pay | Admitting: Internal Medicine

## 2019-09-25 DIAGNOSIS — I10 Essential (primary) hypertension: Secondary | ICD-10-CM | POA: Diagnosis not present

## 2019-09-25 DIAGNOSIS — H2511 Age-related nuclear cataract, right eye: Secondary | ICD-10-CM | POA: Diagnosis not present

## 2019-09-26 DIAGNOSIS — M542 Cervicalgia: Secondary | ICD-10-CM | POA: Diagnosis not present

## 2019-09-30 ENCOUNTER — Other Ambulatory Visit: Payer: Self-pay

## 2019-09-30 ENCOUNTER — Encounter: Payer: Self-pay | Admitting: Ophthalmology

## 2019-10-04 ENCOUNTER — Other Ambulatory Visit: Payer: Self-pay

## 2019-10-04 ENCOUNTER — Other Ambulatory Visit
Admission: RE | Admit: 2019-10-04 | Discharge: 2019-10-04 | Disposition: A | Discharge status: Home / Self Care | Payer: Medicare Other | Source: Ambulatory Visit | Attending: Ophthalmology | Admitting: Ophthalmology

## 2019-10-04 DIAGNOSIS — Z20822 Contact with and (suspected) exposure to covid-19: Secondary | ICD-10-CM | POA: Insufficient documentation

## 2019-10-04 DIAGNOSIS — Z01812 Encounter for preprocedural laboratory examination: Secondary | ICD-10-CM | POA: Insufficient documentation

## 2019-10-04 LAB — SARS CORONAVIRUS 2 (TAT 6-24 HRS): SARS Coronavirus 2: NEGATIVE

## 2019-10-04 NOTE — Discharge Instructions (Signed)

## 2019-10-08 ENCOUNTER — Ambulatory Visit: Payer: Medicare Other | Admitting: Anesthesiology

## 2019-10-08 ENCOUNTER — Encounter
Admission: RE | Disposition: A | Discharge status: Home / Self Care | Payer: Self-pay | Source: Home / Self Care | Attending: Ophthalmology

## 2019-10-08 ENCOUNTER — Ambulatory Visit
Admission: RE | Admit: 2019-10-08 | Discharge: 2019-10-08 | Disposition: A | Discharge status: Home / Self Care | Payer: Medicare Other | Attending: Ophthalmology | Admitting: Ophthalmology

## 2019-10-08 ENCOUNTER — Other Ambulatory Visit: Payer: Self-pay

## 2019-10-08 ENCOUNTER — Encounter: Payer: Self-pay | Admitting: Ophthalmology

## 2019-10-08 DIAGNOSIS — K5790 Diverticulosis of intestine, part unspecified, without perforation or abscess without bleeding: Secondary | ICD-10-CM | POA: Diagnosis not present

## 2019-10-08 DIAGNOSIS — G959 Disease of spinal cord, unspecified: Secondary | ICD-10-CM | POA: Diagnosis not present

## 2019-10-08 DIAGNOSIS — Z96659 Presence of unspecified artificial knee joint: Secondary | ICD-10-CM | POA: Diagnosis not present

## 2019-10-08 DIAGNOSIS — H2511 Age-related nuclear cataract, right eye: Secondary | ICD-10-CM | POA: Diagnosis not present

## 2019-10-08 DIAGNOSIS — E669 Obesity, unspecified: Secondary | ICD-10-CM | POA: Diagnosis not present

## 2019-10-08 DIAGNOSIS — Z6832 Body mass index (BMI) 32.0-32.9, adult: Secondary | ICD-10-CM | POA: Insufficient documentation

## 2019-10-08 DIAGNOSIS — K279 Peptic ulcer, site unspecified, unspecified as acute or chronic, without hemorrhage or perforation: Secondary | ICD-10-CM | POA: Insufficient documentation

## 2019-10-08 DIAGNOSIS — I10 Essential (primary) hypertension: Secondary | ICD-10-CM | POA: Diagnosis not present

## 2019-10-08 DIAGNOSIS — Z885 Allergy status to narcotic agent status: Secondary | ICD-10-CM | POA: Insufficient documentation

## 2019-10-08 DIAGNOSIS — M48061 Spinal stenosis, lumbar region without neurogenic claudication: Secondary | ICD-10-CM | POA: Insufficient documentation

## 2019-10-08 DIAGNOSIS — Z87891 Personal history of nicotine dependence: Secondary | ICD-10-CM | POA: Diagnosis not present

## 2019-10-08 DIAGNOSIS — R7303 Prediabetes: Secondary | ICD-10-CM | POA: Insufficient documentation

## 2019-10-08 DIAGNOSIS — M1990 Unspecified osteoarthritis, unspecified site: Secondary | ICD-10-CM | POA: Diagnosis not present

## 2019-10-08 DIAGNOSIS — Z9849 Cataract extraction status, unspecified eye: Secondary | ICD-10-CM | POA: Insufficient documentation

## 2019-10-08 DIAGNOSIS — E785 Hyperlipidemia, unspecified: Secondary | ICD-10-CM | POA: Insufficient documentation

## 2019-10-08 DIAGNOSIS — H25811 Combined forms of age-related cataract, right eye: Secondary | ICD-10-CM | POA: Diagnosis not present

## 2019-10-08 HISTORY — PX: CATARACT EXTRACTION W/PHACO: SHX586

## 2019-10-08 HISTORY — DX: Presence of external hearing-aid: Z97.4

## 2019-10-08 SURGERY — PHACOEMULSIFICATION, CATARACT, WITH IOL INSERTION
Anesthesia: Monitor Anesthesia Care | Site: Eye | Laterality: Right

## 2019-10-08 MED ORDER — BRIMONIDINE TARTRATE-TIMOLOL 0.2-0.5 % OP SOLN
OPHTHALMIC | Status: DC | PRN
  Administered 2019-10-08: 1 [drp] via OPHTHALMIC

## 2019-10-08 MED ORDER — MIDAZOLAM HCL 2 MG/2ML IJ SOLN
INTRAMUSCULAR | Status: DC | PRN
  Administered 2019-10-08: 1 mg via INTRAVENOUS

## 2019-10-08 MED ORDER — ARMC OPHTHALMIC DILATING DROPS
1.0000 "application " | OPHTHALMIC | Status: DC | PRN
  Administered 2019-10-08 (×3): 1 via OPHTHALMIC

## 2019-10-08 MED ORDER — MOXIFLOXACIN HCL 0.5 % OP SOLN
OPHTHALMIC | Status: DC | PRN
  Administered 2019-10-08: 0.2 mL via OPHTHALMIC

## 2019-10-08 MED ORDER — EPINEPHRINE PF 1 MG/ML IJ SOLN
INTRAOCULAR | Status: DC | PRN
  Administered 2019-10-08: 70 mL via OPHTHALMIC

## 2019-10-08 MED ORDER — NA CHONDROIT SULF-NA HYALURON 40-17 MG/ML IO SOLN
INTRAOCULAR | Status: DC | PRN
  Administered 2019-10-08: 1 mL via INTRAOCULAR

## 2019-10-08 MED ORDER — FENTANYL CITRATE (PF) 100 MCG/2ML IJ SOLN
INTRAMUSCULAR | Status: DC | PRN
  Administered 2019-10-08: 50 ug via INTRAVENOUS

## 2019-10-08 MED ORDER — ONDANSETRON HCL 4 MG/2ML IJ SOLN: 4.0000 mg | Freq: Once | INTRAMUSCULAR | Status: DC | PRN

## 2019-10-08 MED ORDER — LACTATED RINGERS IV SOLN: 100.0000 mL/h | INTRAVENOUS | Status: DC

## 2019-10-08 MED ORDER — ACETAMINOPHEN 10 MG/ML IV SOLN: 1000.0000 mg | Freq: Once | INTRAVENOUS | Status: DC | PRN

## 2019-10-08 MED ORDER — LIDOCAINE HCL (PF) 2 % IJ SOLN
INTRAOCULAR | Status: DC | PRN
  Administered 2019-10-08: 1 mL

## 2019-10-08 MED ORDER — TETRACAINE HCL 0.5 % OP SOLN
1.0000 [drp] | OPHTHALMIC | Status: DC | PRN
  Administered 2019-10-08 (×3): 1 [drp] via OPHTHALMIC

## 2019-10-08 SURGICAL SUPPLY — 20 items
CANNULA ANT/CHMB 27GA (MISCELLANEOUS) ×4 IMPLANT
GLOVE SURG LX 8.0 MICRO (GLOVE) ×2
GLOVE SURG LX STRL 8.0 MICRO (GLOVE) ×2 IMPLANT
GLOVE SURG TRIUMPH 8.0 PF LTX (GLOVE) ×2 IMPLANT
GOWN STRL REUS W/ TWL LRG LVL3 (GOWN DISPOSABLE) ×2 IMPLANT
GOWN STRL REUS W/TWL LRG LVL3 (GOWN DISPOSABLE) ×2
LENS IOL ACRSF IQ ULTRA 14.5 (Intraocular Lens) ×1 IMPLANT
LENS IOL ACRYSOF IQ 14.5 (Intraocular Lens) ×2 IMPLANT
MARKER SKIN DUAL TIP RULER LAB (MISCELLANEOUS) ×2 IMPLANT
NEEDLE FILTER BLUNT 18X 1/2SAF (NEEDLE) ×1
NEEDLE FILTER BLUNT 18X1 1/2 (NEEDLE) ×1 IMPLANT
PACK EYE AFTER SURG (MISCELLANEOUS) ×2 IMPLANT
PACK OPTHALMIC (MISCELLANEOUS) ×2 IMPLANT
PACK PORFILIO (MISCELLANEOUS) ×2 IMPLANT
SUT ETHILON 10-0 CS-B-6CS-B-6 (SUTURE)
SUTURE EHLN 10-0 CS-B-6CS-B-6 (SUTURE) IMPLANT
SYR 3ML LL SCALE MARK (SYRINGE) ×2 IMPLANT
SYR TB 1ML LUER SLIP (SYRINGE) ×2 IMPLANT
WATER STERILE IRR 250ML POUR (IV SOLUTION) ×2 IMPLANT
WIPE NON LINTING 3.25X3.25 (MISCELLANEOUS) ×2 IMPLANT

## 2019-10-08 NOTE — Anesthesia Preprocedure Evaluation (Signed)
Anesthesia Evaluation  Patient identified by MRN, date of birth, ID band Patient awake    Reviewed: Allergy & Precautions, NPO status , Patient's Chart, lab work & pertinent test results, reviewed documented beta blocker date and time   History of Anesthesia Complications Negative for: history of anesthetic complications  Airway Mallampati: III  TM Distance: >3 FB Neck ROM: Full    Dental   Pulmonary    breath sounds clear to auscultation       Cardiovascular hypertension, (-) angina(-) DOE  Rhythm:Regular Rate:Normal   HLD   Neuro/Psych  Neuromuscular disease (Lumbar stenosis, cervical myelopathy)    GI/Hepatic PUD, neg GERD  , Diverticulosis   Endo/Other  diabetes (Pre-DM)  Renal/GU      Musculoskeletal  (+) Arthritis ,   Abdominal (+) + obese (BMI 32),   Peds  Hematology   Anesthesia Other Findings   Reproductive/Obstetrics                             Anesthesia Physical Anesthesia Plan  ASA: II  Anesthesia Plan: MAC   Post-op Pain Management:    Induction: Intravenous  PONV Risk Score and Plan: 1 and TIVA, Midazolam and Treatment may vary due to age or medical condition  Airway Management Planned: Nasal Cannula  Additional Equipment:   Intra-op Plan:   Post-operative Plan:   Informed Consent: I have reviewed the patients History and Physical, chart, labs and discussed the procedure including the risks, benefits and alternatives for the proposed anesthesia with the patient or authorized representative who has indicated his/her understanding and acceptance.       Plan Discussed with: CRNA and Anesthesiologist  Anesthesia Plan Comments:         Anesthesia Quick Evaluation

## 2019-10-08 NOTE — Anesthesia Procedure Notes (Signed)
Procedure Name: MAC Date/Time: 10/08/2019 11:41 AM Performed by: Cameron Ali, CRNA Pre-anesthesia Checklist: Patient identified, Emergency Drugs available, Suction available, Timeout performed and Patient being monitored Patient Re-evaluated:Patient Re-evaluated prior to induction Oxygen Delivery Method: Nasal cannula Placement Confirmation: positive ETCO2

## 2019-10-08 NOTE — Anesthesia Postprocedure Evaluation (Signed)
Anesthesia Post Note  Patient: Evan Giles  Procedure(s) Performed: CATARACT EXTRACTION PHACO AND INTRAOCULAR LENS PLACEMENT (IOC) RIGHT (Right Eye)     Patient location during evaluation: PACU Anesthesia Type: MAC Level of consciousness: awake and alert Pain management: pain level controlled Vital Signs Assessment: post-procedure vital signs reviewed and stable Respiratory status: spontaneous breathing, nonlabored ventilation, respiratory function stable and patient connected to nasal cannula oxygen Cardiovascular status: stable and blood pressure returned to baseline Postop Assessment: no apparent nausea or vomiting Anesthetic complications: no   No complications documented.  Cranston Koors A  Shantelle Alles

## 2019-10-08 NOTE — H&P (Signed)
All labs reviewed. Abnormal studies sent to patients PCP when indicated.  Previous H&P reviewed, patient examined, there are NO CHANGES.  Evan Porfilio6/15/202111:32 AM

## 2019-10-08 NOTE — Transfer of Care (Signed)
Immediate Anesthesia Transfer of Care Note  Patient: Evan Giles  Procedure(s) Performed: CATARACT EXTRACTION PHACO AND INTRAOCULAR LENS PLACEMENT (IOC) RIGHT (Right Eye)  Patient Location: PACU  Anesthesia Type: MAC  Level of Consciousness: awake, alert  and patient cooperative  Airway and Oxygen Therapy: Patient Spontanous Breathing and Patient connected to supplemental oxygen  Post-op Assessment: Post-op Vital signs reviewed, Patient's Cardiovascular Status Stable, Respiratory Function Stable, Patent Airway and No signs of Nausea or vomiting  Post-op Vital Signs: Reviewed and stable  Complications: No complications documented.

## 2019-10-08 NOTE — Op Note (Signed)
PREOPERATIVE DIAGNOSIS:  Nuclear sclerotic cataract of the right eye.   POSTOPERATIVE DIAGNOSIS:  Cataract   OPERATIVE PROCEDURE:@   SURGEON:  Birder Robson, MD.   ANESTHESIA:  Anesthesiologist: Heniser, Fredric Dine, MD CRNA: Cameron Ali, CRNA  1.      Managed anesthesia care. 2.      0.81ml of Shugarcaine was instilled in the eye following the paracentesis.   COMPLICATIONS:  None.   TECHNIQUE:   Stop and chop   DESCRIPTION OF PROCEDURE:  The patient was examined and consented in the preoperative holding area where the aforementioned topical anesthesia was applied to the right eye and then brought back to the Operating Room where the right eye was prepped and draped in the usual sterile ophthalmic fashion and a lid speculum was placed. A paracentesis was created with the side port blade and the anterior chamber was filled with viscoelastic. A near clear corneal incision was performed with the steel keratome. A continuous curvilinear capsulorrhexis was performed with a cystotome followed by the capsulorrhexis forceps. Hydrodissection and hydrodelineation were carried out with BSS on a blunt cannula. The lens was removed in a stop and chop  technique and the remaining cortical material was removed with the irrigation-aspiration handpiece. The capsular bag was inflated with viscoelastic and the Technis [id number]  lens was placed in the capsular bag without complication. The remaining viscoelastic was removed from the eye with the irrigation-aspiration handpiece. The wounds were hydrated. The anterior chamber was flushed with BSS and the eye was inflated to physiologic pressure. 0.62ml of Vigamox was placed in the anterior chamber. The wounds were found to be water tight. The eye was dressed with Combigan. The patient was given protective glasses to wear throughout the day and a shield with which to sleep tonight. The patient was also given drops with which to begin a drop regimen today and will follow-up  with me in one day. Implant Name Type Inv. Item Serial No. Manufacturer Lot No. LRB No. Used Action  LENS IOL ACRYSOF IQ 14.5 - [id number] Intraocular Lens LENS IOL ACRYSOF IQ 14.5 [id number] ALCON  Right 1 Implanted   Procedure(s) with comments: CATARACT EXTRACTION PHACO AND INTRAOCULAR LENS PLACEMENT (IOC) RIGHT (Right) - 7.85 0:46.3  Electronically signed: Anacleto Batterman 10/08/2019 12:01 PM

## 2019-10-09 ENCOUNTER — Encounter: Payer: Self-pay | Admitting: Ophthalmology

## 2019-10-22 DIAGNOSIS — M542 Cervicalgia: Secondary | ICD-10-CM | POA: Diagnosis not present

## 2019-10-22 DIAGNOSIS — R3914 Feeling of incomplete bladder emptying: Secondary | ICD-10-CM | POA: Diagnosis not present

## 2019-10-22 DIAGNOSIS — R3911 Hesitancy of micturition: Secondary | ICD-10-CM | POA: Diagnosis not present

## 2019-10-22 DIAGNOSIS — M4802 Spinal stenosis, cervical region: Secondary | ICD-10-CM | POA: Diagnosis not present

## 2019-10-29 DIAGNOSIS — I10 Essential (primary) hypertension: Secondary | ICD-10-CM | POA: Diagnosis not present

## 2019-10-29 DIAGNOSIS — M4712 Other spondylosis with myelopathy, cervical region: Secondary | ICD-10-CM | POA: Diagnosis not present

## 2019-10-31 ENCOUNTER — Other Ambulatory Visit: Payer: Self-pay | Admitting: Neurosurgery

## 2019-11-12 ENCOUNTER — Other Ambulatory Visit: Payer: Self-pay | Admitting: Neurosurgery

## 2019-11-19 NOTE — Progress Notes (Signed)
[place], [place] [address] Phone: [phone] Fax: [phone]  [place], [place] [address] Phone: [phone] Fax: [phone]      Your procedure is scheduled on Wednesday August 4  Report to [facility] Main Entrance "A" at [place] and check in at the Admitting office.  Call this number if you have problems the morning of surgery:  [phone]  Call [phone] if you have any questions prior to your surgery date Monday-Friday 8am-4pm    Remember:  Do not eat or drink after midnight the night before your surgery     Take these medicines the morning of surgery with A SIP OF WATER  atorvastatin (LIPITOR)  As of today, STOP taking any Aspirin (unless otherwise instructed by your surgeon) Aleve, Naproxen, Ibuprofen, Motrin, Advil, Goody's, BC's, all herbal medications, fish oil, and all vitamins.                      Do not wear jewelry            Do not wear lotions, powders, colognes, or deodorant.            Men may shave face and neck.            Do not bring valuables to the hospital.            [facility] is not responsible for any belongings or valuables.  Do NOT Smoke (Tobacco/Vaping) or drink Alcohol 24 hours prior to your procedure If you use a CPAP at night, you may bring all equipment for your overnight stay.   Contacts, glasses, dentures or bridgework may not be worn into surgery.      For patients admitted to the hospital, discharge time will be determined by your treatment team.   Patients discharged the day of surgery will not be allowed to drive home, and someone needs to stay with them for 24 hours.    Special instructions:   [facility]- Preparing For Surgery  Before surgery, you can play an important role. Because skin is not sterile, your skin needs to be as free of germs as possible. You can reduce the  number of germs on your skin by washing with CHG (chlorahexidine gluconate) Soap before surgery.  CHG is an antiseptic cleaner which kills germs and bonds with the skin to continue killing germs even after washing.    Oral Hygiene is also important to reduce your risk of infection.  Remember - BRUSH YOUR TEETH THE MORNING OF SURGERY WITH YOUR REGULAR TOOTHPASTE  Please do not use if you have an allergy to CHG or antibacterial soaps. If your skin becomes reddened/irritated stop using the CHG.  Do not shave (including legs and underarms) for at least 48 hours prior to first CHG shower. It is OK to shave your face.  Please follow these instructions carefully.   1. Shower the NIGHT BEFORE SURGERY and the MORNING OF SURGERY with CHG Soap.   2. If you chose to wash your hair, wash your hair first as usual with your normal shampoo.  3. After you shampoo, rinse your hair and body thoroughly to remove the shampoo.  4. Use CHG as you would any other liquid soap. You can apply CHG directly to the skin and wash gently with a scrungie or a clean washcloth.  5. Apply the CHG Soap to your body ONLY FROM THE NECK DOWN.  Do not use on open wounds or open sores. Avoid contact with your eyes, ears, mouth and genitals (private parts). Wash Face and genitals (private parts)  with your normal soap.   6. Wash thoroughly, paying special attention to the area where your surgery will be performed.  7. Thoroughly rinse your body with warm water from the neck down.  8. DO NOT shower/wash with your normal soap after using and rinsing off the CHG Soap.  9. Pat yourself dry with a CLEAN TOWEL.  10. Wear CLEAN PAJAMAS to bed the night before surgery  11. Place CLEAN SHEETS on your bed the night of your first shower and DO NOT SLEEP WITH PETS.   Day of Surgery: Wear Clean/Comfortable clothing the morning of surgery Do not apply any deodorants/lotions.   Remember to brush your teeth WITH YOUR REGULAR TOOTHPASTE.    Please read over the following fact sheets that you were given.

## 2019-11-20 ENCOUNTER — Encounter (HOSPITAL_COMMUNITY)
Admission: RE | Admit: 2019-11-20 | Discharge: 2019-11-20 | Disposition: A | Discharge status: Home / Self Care | Payer: Medicare Other | Source: Ambulatory Visit | Attending: Neurosurgery | Admitting: Neurosurgery

## 2019-11-20 ENCOUNTER — Other Ambulatory Visit: Payer: Self-pay

## 2019-11-20 ENCOUNTER — Encounter (HOSPITAL_COMMUNITY): Payer: Self-pay

## 2019-11-20 DIAGNOSIS — Z01812 Encounter for preprocedural laboratory examination: Secondary | ICD-10-CM | POA: Insufficient documentation

## 2019-11-20 LAB — BASIC METABOLIC PANEL
Anion gap: 8 (ref 5–15)
BUN: 14 mg/dL (ref 8–23)
CO2: 23 mmol/L (ref 22–32)
Calcium: 9 mg/dL (ref 8.9–10.3)
Chloride: 107 mmol/L (ref 98–111)
Creatinine, Ser: 0.8 mg/dL (ref 0.61–1.24)
GFR calc Af Amer: >60 mL/min (ref >60)
GFR calc non Af Amer: >60 mL/min (ref >60)
Glucose, Bld: 118 mg/dL — ABNORMAL HIGH (ref 70–99)
Potassium: 4.2 mmol/L (ref 3.5–5.1)
Sodium: 138 mmol/L (ref 135–145)

## 2019-11-20 LAB — TYPE AND SCREEN
ABO/RH(D): A POS
Antibody Screen: NEGATIVE

## 2019-11-20 LAB — CBC
HCT: 42.1 % (ref 39.0–52.0)
Hemoglobin: 13.5 g/dL (ref 13.0–17.0)
MCH: 28.9 pg (ref 26.0–34.0)
MCHC: 32.1 g/dL (ref 30.0–36.0)
MCV: 90.1 fL (ref 80.0–100.0)
Platelets: 255 10*3/uL (ref 150–400)
RBC: 4.67 MIL/uL (ref 4.22–5.81)
RDW: 13.2 % (ref 11.5–15.5)
WBC: 7.8 10*3/uL (ref 4.0–10.5)
nRBC: 0 % (ref 0.0–0.2)

## 2019-11-20 LAB — SURGICAL PCR SCREEN
MRSA, PCR: NEGATIVE
Staphylococcus aureus: NEGATIVE

## 2019-11-20 MED ORDER — CHLORHEXIDINE GLUCONATE CLOTH 2 % EX PADS: 6.0000 | MEDICATED_PAD | Freq: Once | CUTANEOUS | Status: DC

## 2019-11-20 NOTE — Progress Notes (Signed)
PCP - DR Evalina Field Cardiologist - NA  Chest x-ray - NA EKG - 12/20 Stress Test - NA ECHO - NA   Blood Thinner Instructions:NONE Aspirin Instructions:STOP   COVID TEST- FOR MON. @[facility]   Anesthesia review: EKG  Patient denies shortness of breath, fever, cough and chest pain at PAT appointment   All instructions explained to the patient, with a verbal understanding of the material. Patient agrees to go over the instructions while at home for a better understanding. Patient also instructed to self quarantine after being tested for COVID-19. The opportunity to ask questions was provided.

## 2019-11-25 ENCOUNTER — Other Ambulatory Visit: Payer: Self-pay

## 2019-11-25 ENCOUNTER — Other Ambulatory Visit
Admission: RE | Admit: 2019-11-25 | Discharge: 2019-11-25 | Disposition: A | Discharge status: Home / Self Care | Payer: Medicare Other | Source: Ambulatory Visit | Attending: Neurosurgery | Admitting: Neurosurgery

## 2019-11-25 DIAGNOSIS — Z20822 Contact with and (suspected) exposure to covid-19: Secondary | ICD-10-CM | POA: Insufficient documentation

## 2019-11-25 DIAGNOSIS — Z01812 Encounter for preprocedural laboratory examination: Secondary | ICD-10-CM | POA: Insufficient documentation

## 2019-11-25 LAB — SARS CORONAVIRUS 2 (TAT 6-24 HRS): SARS Coronavirus 2: NEGATIVE

## 2019-11-27 ENCOUNTER — Ambulatory Visit (HOSPITAL_COMMUNITY): Payer: Medicare Other | Admitting: Certified Registered Nurse Anesthetist

## 2019-11-27 ENCOUNTER — Encounter (HOSPITAL_COMMUNITY)
Admission: RE | Disposition: A | Discharge status: Home / Self Care | Payer: Self-pay | Source: Home / Self Care | Attending: Neurosurgery

## 2019-11-27 ENCOUNTER — Other Ambulatory Visit: Payer: Self-pay

## 2019-11-27 ENCOUNTER — Ambulatory Visit (HOSPITAL_COMMUNITY): Payer: Medicare Other

## 2019-11-27 ENCOUNTER — Encounter (HOSPITAL_COMMUNITY): Payer: Self-pay | Admitting: Neurosurgery

## 2019-11-27 ENCOUNTER — Ambulatory Visit (HOSPITAL_COMMUNITY): Payer: Medicare Other | Admitting: Physician Assistant

## 2019-11-27 ENCOUNTER — Ambulatory Visit (HOSPITAL_COMMUNITY)
Admission: RE | Admit: 2019-11-27 | Discharge: 2019-11-28 | Disposition: A | Discharge status: Home / Self Care | Payer: Medicare Other | Attending: Neurosurgery | Admitting: Neurosurgery

## 2019-11-27 DIAGNOSIS — E785 Hyperlipidemia, unspecified: Secondary | ICD-10-CM | POA: Insufficient documentation

## 2019-11-27 DIAGNOSIS — Z79899 Other long term (current) drug therapy: Secondary | ICD-10-CM | POA: Diagnosis not present

## 2019-11-27 DIAGNOSIS — Z419 Encounter for procedure for purposes other than remedying health state, unspecified: Secondary | ICD-10-CM

## 2019-11-27 DIAGNOSIS — M50121 Cervical disc disorder at C4-C5 level with radiculopathy: Secondary | ICD-10-CM | POA: Diagnosis not present

## 2019-11-27 DIAGNOSIS — M50021 Cervical disc disorder at C4-C5 level with myelopathy: Secondary | ICD-10-CM | POA: Diagnosis not present

## 2019-11-27 DIAGNOSIS — M4712 Other spondylosis with myelopathy, cervical region: Secondary | ICD-10-CM | POA: Diagnosis not present

## 2019-11-27 DIAGNOSIS — H9190 Unspecified hearing loss, unspecified ear: Secondary | ICD-10-CM | POA: Diagnosis not present

## 2019-11-27 DIAGNOSIS — I10 Essential (primary) hypertension: Secondary | ICD-10-CM | POA: Insufficient documentation

## 2019-11-27 DIAGNOSIS — Z7982 Long term (current) use of aspirin: Secondary | ICD-10-CM | POA: Diagnosis not present

## 2019-11-27 DIAGNOSIS — M4802 Spinal stenosis, cervical region: Secondary | ICD-10-CM | POA: Diagnosis not present

## 2019-11-27 DIAGNOSIS — Z981 Arthrodesis status: Secondary | ICD-10-CM | POA: Diagnosis not present

## 2019-11-27 DIAGNOSIS — M4722 Other spondylosis with radiculopathy, cervical region: Secondary | ICD-10-CM | POA: Diagnosis not present

## 2019-11-27 DIAGNOSIS — M4322 Fusion of spine, cervical region: Secondary | ICD-10-CM | POA: Diagnosis not present

## 2019-11-27 HISTORY — PX: ANTERIOR CERVICAL DECOMP/DISCECTOMY FUSION: SHX1161

## 2019-11-27 LAB — ABO/RH: ABO/RH(D): A POS

## 2019-11-27 SURGERY — ANTERIOR CERVICAL DECOMPRESSION/DISCECTOMY FUSION 2 LEVEL/HARDWARE REMOVAL
Anesthesia: General | Site: Spine Cervical

## 2019-11-27 MED ORDER — ACETAMINOPHEN 650 MG RE SUPP: 650.0000 mg | RECTAL | Status: DC | PRN

## 2019-11-27 MED ORDER — PANTOPRAZOLE SODIUM 40 MG PO TBEC
40.0000 mg | DELAYED_RELEASE_TABLET | Freq: Every day | ORAL | Status: DC
  Administered 2019-11-27: 40 mg via ORAL
  Filled 2019-11-27: qty 1

## 2019-11-27 MED ORDER — ACETAMINOPHEN 500 MG PO TABS
1000.0000 mg | ORAL_TABLET | Freq: Four times a day (QID) | ORAL | Status: DC
  Administered 2019-11-27 – 2019-11-28 (×3): 1000 mg via ORAL
  Filled 2019-11-27 (×3): qty 2

## 2019-11-27 MED ORDER — OXYCODONE HCL 5 MG PO TABS
ORAL_TABLET | ORAL | Status: AC
  Filled 2019-11-27: qty 2

## 2019-11-27 MED ORDER — FENTANYL CITRATE (PF) 100 MCG/2ML IJ SOLN
INTRAMUSCULAR | Status: AC
  Filled 2019-11-27: qty 2

## 2019-11-27 MED ORDER — MAGNESIUM 250 MG PO TABS: 250.0000 mg | ORAL_TABLET | Freq: Every day | ORAL | Status: DC

## 2019-11-27 MED ORDER — ATORVASTATIN CALCIUM 10 MG PO TABS
20.0000 mg | ORAL_TABLET | Freq: Every day | ORAL | Status: DC
  Administered 2019-11-27: 20 mg via ORAL
  Filled 2019-11-27: qty 2

## 2019-11-27 MED ORDER — ONDANSETRON HCL 4 MG/2ML IJ SOLN
INTRAMUSCULAR | Status: DC | PRN
  Administered 2019-11-27: 4 mg via INTRAVENOUS

## 2019-11-27 MED ORDER — 0.9 % SODIUM CHLORIDE (POUR BTL) OPTIME
TOPICAL | Status: DC | PRN
  Administered 2019-11-27: 1000 mL

## 2019-11-27 MED ORDER — SUGAMMADEX SODIUM 200 MG/2ML IV SOLN
INTRAVENOUS | Status: DC | PRN
  Administered 2019-11-27: 200 mg via INTRAVENOUS

## 2019-11-27 MED ORDER — BUPIVACAINE-EPINEPHRINE 0.5% -1:200000 IJ SOLN
INTRAMUSCULAR | Status: AC
  Filled 2019-11-27: qty 1

## 2019-11-27 MED ORDER — DEXAMETHASONE SODIUM PHOSPHATE 10 MG/ML IJ SOLN
INTRAMUSCULAR | Status: DC | PRN
  Administered 2019-11-27: 8 mg via INTRAVENOUS

## 2019-11-27 MED ORDER — CHLORHEXIDINE GLUCONATE 0.12 % MT SOLN
15.0000 mL | Freq: Once | OROMUCOSAL | Status: AC
  Administered 2019-11-27: 15 mL via OROMUCOSAL
  Filled 2019-11-27: qty 15

## 2019-11-27 MED ORDER — ACETAMINOPHEN 500 MG PO TABS
1000.0000 mg | ORAL_TABLET | Freq: Once | ORAL | Status: AC
  Administered 2019-11-27: 1000 mg via ORAL
  Filled 2019-11-27: qty 2

## 2019-11-27 MED ORDER — ONDANSETRON HCL 4 MG/2ML IJ SOLN
INTRAMUSCULAR | Status: AC
  Filled 2019-11-27: qty 2

## 2019-11-27 MED ORDER — THROMBIN 5000 UNITS EX SOLR
OROMUCOSAL | Status: DC | PRN
  Administered 2019-11-27: 5 mL

## 2019-11-27 MED ORDER — OXYCODONE HCL 5 MG PO TABS
10.0000 mg | ORAL_TABLET | ORAL | Status: DC | PRN
  Administered 2019-11-27 – 2019-11-28 (×3): 10 mg via ORAL
  Filled 2019-11-27 (×2): qty 2

## 2019-11-27 MED ORDER — CYCLOBENZAPRINE HCL 10 MG PO TABS
10.0000 mg | ORAL_TABLET | Freq: Three times a day (TID) | ORAL | Status: DC | PRN
  Administered 2019-11-27: 10 mg via ORAL
  Filled 2019-11-27 (×2): qty 1

## 2019-11-27 MED ORDER — PHENYLEPHRINE 40 MCG/ML (10ML) SYRINGE FOR IV PUSH (FOR BLOOD PRESSURE SUPPORT)
PREFILLED_SYRINGE | INTRAVENOUS | Status: AC
  Filled 2019-11-27: qty 10

## 2019-11-27 MED ORDER — PHENYLEPHRINE HCL (PRESSORS) 10 MG/ML IV SOLN
INTRAVENOUS | Status: DC | PRN
  Administered 2019-11-27 (×2): 80 ug via INTRAVENOUS
  Administered 2019-11-27: 120 ug via INTRAVENOUS

## 2019-11-27 MED ORDER — ONDANSETRON HCL 4 MG/2ML IJ SOLN: 4.0000 mg | Freq: Four times a day (QID) | INTRAMUSCULAR | Status: DC | PRN

## 2019-11-27 MED ORDER — MAGNESIUM OXIDE 400 (241.3 MG) MG PO TABS
200.0000 mg | ORAL_TABLET | Freq: Every day | ORAL | Status: DC
  Administered 2019-11-28: 200 mg via ORAL
  Filled 2019-11-27: qty 1

## 2019-11-27 MED ORDER — LACTATED RINGERS IV SOLN: INTRAVENOUS | Status: DC

## 2019-11-27 MED ORDER — CEFAZOLIN SODIUM-DEXTROSE 2-4 GM/100ML-% IV SOLN
2.0000 g | INTRAVENOUS | Status: AC
  Administered 2019-11-27: 2 g via INTRAVENOUS
  Filled 2019-11-27: qty 100

## 2019-11-27 MED ORDER — HYDROCHLOROTHIAZIDE 12.5 MG PO CAPS: 12.5000 mg | ORAL_CAPSULE | Freq: Every day | ORAL | Status: DC

## 2019-11-27 MED ORDER — BISACODYL 10 MG RE SUPP: 10.0000 mg | Freq: Every day | RECTAL | Status: DC | PRN

## 2019-11-27 MED ORDER — OXYCODONE HCL 5 MG PO TABS: 5.0000 mg | ORAL_TABLET | ORAL | Status: DC | PRN

## 2019-11-27 MED ORDER — ROCURONIUM BROMIDE 10 MG/ML (PF) SYRINGE
PREFILLED_SYRINGE | INTRAVENOUS | Status: DC | PRN
  Administered 2019-11-27: 100 mg via INTRAVENOUS

## 2019-11-27 MED ORDER — BUPIVACAINE-EPINEPHRINE 0.5% -1:200000 IJ SOLN
INTRAMUSCULAR | Status: DC | PRN
  Administered 2019-11-27: 10 mL

## 2019-11-27 MED ORDER — PROPOFOL 10 MG/ML IV BOLUS
INTRAVENOUS | Status: AC
  Filled 2019-11-27: qty 20

## 2019-11-27 MED ORDER — LOSARTAN POTASSIUM-HCTZ 100-12.5 MG PO TABS: 1.0000 | ORAL_TABLET | Freq: Every day | ORAL | Status: DC

## 2019-11-27 MED ORDER — LOSARTAN POTASSIUM 50 MG PO TABS
100.0000 mg | ORAL_TABLET | Freq: Every day | ORAL | Status: DC
  Administered 2019-11-27: 100 mg via ORAL
  Filled 2019-11-27 (×2): qty 2

## 2019-11-27 MED ORDER — THROMBIN 5000 UNITS EX SOLR
CUTANEOUS | Status: AC
  Filled 2019-11-27: qty 15000

## 2019-11-27 MED ORDER — FENTANYL CITRATE (PF) 100 MCG/2ML IJ SOLN
25.0000 ug | INTRAMUSCULAR | Status: DC | PRN
  Administered 2019-11-27 (×2): 25 ug via INTRAVENOUS
  Administered 2019-11-27: 50 ug via INTRAVENOUS

## 2019-11-27 MED ORDER — LIDOCAINE 2% (20 MG/ML) 5 ML SYRINGE
INTRAMUSCULAR | Status: DC | PRN
  Administered 2019-11-27: 80 mg via INTRAVENOUS

## 2019-11-27 MED ORDER — PHENOL 1.4 % MT LIQD
1.0000 | OROMUCOSAL | Status: DC | PRN
  Filled 2019-11-27: qty 177

## 2019-11-27 MED ORDER — DEXAMETHASONE SODIUM PHOSPHATE 10 MG/ML IJ SOLN
INTRAMUSCULAR | Status: AC
  Filled 2019-11-27: qty 2

## 2019-11-27 MED ORDER — FENTANYL CITRATE (PF) 100 MCG/2ML IJ SOLN
INTRAMUSCULAR | Status: DC | PRN
  Administered 2019-11-27 (×3): 50 ug via INTRAVENOUS

## 2019-11-27 MED ORDER — SODIUM CHLORIDE 0.9 % IV SOLN
INTRAVENOUS | Status: DC | PRN
  Administered 2019-11-27: 500 mL

## 2019-11-27 MED ORDER — ALUM & MAG HYDROXIDE-SIMETH 200-200-20 MG/5ML PO SUSP: 30.0000 mL | Freq: Four times a day (QID) | ORAL | Status: DC | PRN

## 2019-11-27 MED ORDER — BACITRACIN ZINC 500 UNIT/GM EX OINT
TOPICAL_OINTMENT | CUTANEOUS | Status: DC | PRN
  Administered 2019-11-27: 1 via TOPICAL

## 2019-11-27 MED ORDER — DEXAMETHASONE 4 MG PO TABS
4.0000 mg | ORAL_TABLET | Freq: Four times a day (QID) | ORAL | Status: AC
  Administered 2019-11-27 (×2): 4 mg via ORAL
  Filled 2019-11-27 (×2): qty 1

## 2019-11-27 MED ORDER — PANTOPRAZOLE SODIUM 40 MG IV SOLR: 40.0000 mg | Freq: Every day | INTRAVENOUS | Status: DC

## 2019-11-27 MED ORDER — PROPOFOL 10 MG/ML IV BOLUS
INTRAVENOUS | Status: DC | PRN
  Administered 2019-11-27: 130 mg via INTRAVENOUS

## 2019-11-27 MED ORDER — MENTHOL 3 MG MT LOZG: 1.0000 | LOZENGE | OROMUCOSAL | Status: DC | PRN

## 2019-11-27 MED ORDER — HYDROCHLOROTHIAZIDE 12.5 MG PO CAPS
12.5000 mg | ORAL_CAPSULE | Freq: Every day | ORAL | Status: DC
  Administered 2019-11-27: 12.5 mg via ORAL
  Filled 2019-11-27 (×2): qty 1

## 2019-11-27 MED ORDER — CYCLOBENZAPRINE HCL 10 MG PO TABS
ORAL_TABLET | ORAL | Status: AC
  Filled 2019-11-27: qty 1

## 2019-11-27 MED ORDER — BACITRACIN ZINC 500 UNIT/GM EX OINT
TOPICAL_OINTMENT | CUTANEOUS | Status: AC
  Filled 2019-11-27: qty 28.35

## 2019-11-27 MED ORDER — DOCUSATE SODIUM 100 MG PO CAPS
100.0000 mg | ORAL_CAPSULE | Freq: Two times a day (BID) | ORAL | Status: DC
  Administered 2019-11-27 – 2019-11-28 (×2): 100 mg via ORAL
  Filled 2019-11-27 (×2): qty 1

## 2019-11-27 MED ORDER — TAMSULOSIN HCL 0.4 MG PO CAPS
0.8000 mg | ORAL_CAPSULE | Freq: Every day | ORAL | Status: DC
  Administered 2019-11-27: 0.8 mg via ORAL
  Filled 2019-11-27: qty 2

## 2019-11-27 MED ORDER — FENTANYL CITRATE (PF) 250 MCG/5ML IJ SOLN
INTRAMUSCULAR | Status: AC
  Filled 2019-11-27: qty 5

## 2019-11-27 MED ORDER — ONDANSETRON HCL 4 MG PO TABS: 4.0000 mg | ORAL_TABLET | Freq: Four times a day (QID) | ORAL | Status: DC | PRN

## 2019-11-27 MED ORDER — CEFAZOLIN SODIUM-DEXTROSE 2-4 GM/100ML-% IV SOLN
2.0000 g | Freq: Three times a day (TID) | INTRAVENOUS | Status: AC
  Administered 2019-11-27 – 2019-11-28 (×2): 2 g via INTRAVENOUS
  Filled 2019-11-27 (×2): qty 100

## 2019-11-27 MED ORDER — LOSARTAN POTASSIUM 50 MG PO TABS: 100.0000 mg | ORAL_TABLET | Freq: Every day | ORAL | Status: DC

## 2019-11-27 MED ORDER — DEXAMETHASONE SODIUM PHOSPHATE 4 MG/ML IJ SOLN: 4.0000 mg | Freq: Four times a day (QID) | INTRAMUSCULAR | Status: AC

## 2019-11-27 MED ORDER — ORAL CARE MOUTH RINSE: 15.0000 mL | Freq: Once | OROMUCOSAL | Status: AC

## 2019-11-27 MED ORDER — PHENYLEPHRINE HCL-NACL 10-0.9 MG/250ML-% IV SOLN
INTRAVENOUS | Status: DC | PRN
  Administered 2019-11-27: 20 ug/min via INTRAVENOUS

## 2019-11-27 MED ORDER — MORPHINE SULFATE (PF) 4 MG/ML IV SOLN: 4.0000 mg | INTRAVENOUS | Status: DC | PRN

## 2019-11-27 MED ORDER — ACETAMINOPHEN 325 MG PO TABS: 650.0000 mg | ORAL_TABLET | ORAL | Status: DC | PRN

## 2019-11-27 SURGICAL SUPPLY — 58 items
BAG DECANTER FOR FLEXI CONT (MISCELLANEOUS) ×3 IMPLANT
BAND RUBBER #18 3X1/16 STRL (MISCELLANEOUS) IMPLANT
BENZOIN TINCTURE PRP APPL 2/3 (GAUZE/BANDAGES/DRESSINGS) ×3 IMPLANT
BIT DRILL NEURO 2X3.1 SFT TUCH (MISCELLANEOUS) ×1 IMPLANT
BLADE SURG 15 STRL LF DISP TIS (BLADE) ×1 IMPLANT
BLADE SURG 15 STRL SS (BLADE) ×2
BLADE ULTRA TIP 2M (BLADE) ×3 IMPLANT
BUR BARREL STRAIGHT FLUTE 4.0 (BURR) ×3 IMPLANT
BUR MATCHSTICK NEURO 3.0 LAGG (BURR) ×3 IMPLANT
CANISTER SUCT 3000ML PPV (MISCELLANEOUS) ×3 IMPLANT
CARTRIDGE OIL MAESTRO DRILL (MISCELLANEOUS) ×1 IMPLANT
CLOSURE WOUND 1/2 X4 (GAUZE/BANDAGES/DRESSINGS) ×1
COVER MAYO STAND STRL (DRAPES) ×3 IMPLANT
COVER WAND RF STERILE (DRAPES) ×3 IMPLANT
DECANTER SPIKE VIAL GLASS SM (MISCELLANEOUS) ×3 IMPLANT
DIFFUSER DRILL AIR PNEUMATIC (MISCELLANEOUS) ×3 IMPLANT
DRAPE LAPAROTOMY 100X72 PEDS (DRAPES) ×3 IMPLANT
DRAPE MICROSCOPE LEICA (MISCELLANEOUS) IMPLANT
DRAPE SURG 17X23 STRL (DRAPES) ×6 IMPLANT
DRILL NEURO 2X3.1 SOFT TOUCH (MISCELLANEOUS) ×3
DRSG OPSITE POSTOP 4X6 (GAUZE/BANDAGES/DRESSINGS) ×3 IMPLANT
ELECT REM PT RETURN 9FT ADLT (ELECTROSURGICAL) ×3
ELECTRODE REM PT RTRN 9FT ADLT (ELECTROSURGICAL) ×1 IMPLANT
GAUZE 4X4 16PLY RFD (DISPOSABLE) IMPLANT
GAUZE SPONGE 4X4 12PLY STRL (GAUZE/BANDAGES/DRESSINGS) ×3 IMPLANT
GLOVE BIO SURGEON STRL SZ8 (GLOVE) ×3 IMPLANT
GLOVE BIO SURGEON STRL SZ8.5 (GLOVE) ×3 IMPLANT
GLOVE BIOGEL PI IND STRL 7.5 (GLOVE) ×1 IMPLANT
GLOVE BIOGEL PI INDICATOR 7.5 (GLOVE) ×2
GLOVE ECLIPSE 7.5 STRL STRAW (GLOVE) ×3 IMPLANT
GLOVE EXAM NITRILE XL STR (GLOVE) IMPLANT
GOWN STRL REUS W/ TWL LRG LVL3 (GOWN DISPOSABLE) IMPLANT
GOWN STRL REUS W/ TWL XL LVL3 (GOWN DISPOSABLE) ×1 IMPLANT
GOWN STRL REUS W/TWL LRG LVL3 (GOWN DISPOSABLE)
GOWN STRL REUS W/TWL XL LVL3 (GOWN DISPOSABLE) ×2
HEMOSTAT POWDER KIT SURGIFOAM (HEMOSTASIS) ×3 IMPLANT
KIT BASIN OR (CUSTOM PROCEDURE TRAY) ×3 IMPLANT
KIT TURNOVER KIT B (KITS) ×3 IMPLANT
MARKER SKIN DUAL TIP RULER LAB (MISCELLANEOUS) ×3 IMPLANT
NEEDLE HYPO 22GX1.5 SAFETY (NEEDLE) ×3 IMPLANT
NEEDLE SPNL 18GX3.5 QUINCKE PK (NEEDLE) ×3 IMPLANT
NS IRRIG 1000ML POUR BTL (IV SOLUTION) ×3 IMPLANT
OIL CARTRIDGE MAESTRO DRILL (MISCELLANEOUS) ×3
PACK LAMINECTOMY NEURO (CUSTOM PROCEDURE TRAY) ×3 IMPLANT
PEEK VISTA 14X14X8MM (Peek) ×6 IMPLANT
PIN DISTRACTION 14MM (PIN) ×6 IMPLANT
PLATE ANT CERV XTEND 2 LV 36 (Plate) ×3 IMPLANT
PUTTY DBM 2CC CALC GRAN (Putty) ×3 IMPLANT
SCREW XTD VAR 4.2 SELF TAP (Screw) ×18 IMPLANT
SPONGE INTESTINAL PEANUT (DISPOSABLE) ×6 IMPLANT
SPONGE SURGIFOAM ABS GEL SZ50 (HEMOSTASIS) IMPLANT
STRIP CLOSURE SKIN 1/2X4 (GAUZE/BANDAGES/DRESSINGS) ×2 IMPLANT
SUT VIC AB 0 CT1 27 (SUTURE) ×2
SUT VIC AB 0 CT1 27XBRD ANTBC (SUTURE) ×1 IMPLANT
SUT VIC AB 3-0 SH 8-18 (SUTURE) ×3 IMPLANT
TOWEL GREEN STERILE (TOWEL DISPOSABLE) ×3 IMPLANT
TOWEL GREEN STERILE FF (TOWEL DISPOSABLE) ×3 IMPLANT
WATER STERILE IRR 1000ML POUR (IV SOLUTION) ×3 IMPLANT

## 2019-11-27 NOTE — Progress Notes (Signed)
   Providing Compassionate, Quality Care - Together   Subjective: Patient examined in the PACU. He reports some left trapezius tightness since waking up from anesthesia. The nurse has just given the patient Flexeril. He has mild RUE numbness which was present prior to surgery.  Objective: Vital signs in last 24 hours: Temp:  [97.9 F (36.6 C)-98.4 F (36.9 C)] 98.4 F (36.9 C) (08/04 1451) Pulse Rate:  [64-94] 71 (08/04 1522) Resp:  [19-24] 24 (08/04 1522) BP: (133-147)/(57-75) 138/63 (08/04 1522) SpO2:  [94 %-100 %] 95 % (08/04 1522) Weight:  [108.9 kg] 108.9 kg (08/04 0846)  Intake/Output from previous day: No intake/output data recorded. Intake/Output this shift: Total I/O In: 1400 [I.V.:1400] Out: 125 [Blood:125]  Alert and oriented x 4 PERRLA CN II-XII grossly intact MAE, Strength intact, mild numbness RUE Incision is covered with Honeycomb dressing and Steri Strips; Dressing is clean, dry, and intact Pt donning hard collar  Lab Results: No results for input(s): WBC, HGB, HCT, PLT in the last 72 hours. BMET No results for input(s): NA, K, CL, CO2, GLUCOSE, BUN, CREATININE, CALCIUM in the last 72 hours.  Studies/Results: DG Cervical Spine 1 View  Result Date: 11/27/2019 CLINICAL DATA:  Status post anterior fusion C4-C6 EXAM: DG CERVICAL SPINE - 1 VIEW COMPARISON:  September 26, 2019 FINDINGS: Cross-table lateral image obtained. Limited visualization inferior to C5. There is evident anterior fusion from C4-C6 with disc spacers evident at C3-4 and C4-5. Support hardware intact. No evident fracture or spondylolisthesis. Endotracheal tube present. IMPRESSION: Apparent anterior screw and plate fixation from P3-I9 with limited visualization at C6 on this single cross-table lateral image. Fracture or spondylolisthesis evident in visualized regions. Disc spacers at C3-4 and C4-5 with spaces intact in visualized cervical region. Note limited visualization inferior to C5. Electronically  Signed   By: Lowella Grip III M.D.   On: 11/27/2019 15:29    Assessment/Plan: Patient just underwent C4-5, C5-6 ACDF by Dr. Arnoldo Morale. He is recovering from anesthesia in the PACU.   LOS: 0 days    -Admit to [facility] for observation -Likely discharge home in [place], DNP, AGNP-C Nurse Practitioner  [facility] [place]. [address], [place], [place] [address] P: [phone]    F: [phone]  11/27/2019, 3:46 PM

## 2019-11-27 NOTE — H&P (Signed)
Subjective: The patient is a 79 year old white who on whom I performed a C3-4 anterior cervicectomy, fusion and plating.  He initially did well but more recently has complained of neck pain, bilateral arm pain numbness tingling weakness.  He failed medical management.  He was worked up with a cervical MRI which demonstrated spondylosis and stenosis at C4-5 and C5-6.  I discussed the various treatment options with him.  He has decided to proceed with surgery.  Past Medical History:  Diagnosis Date  . Diverticulitis   . Hard of hearing   . Hyperlipidemia   . Hypertension   . Lumbar stenosis with neurogenic claudication   . Osteoarthrosis of knee   . Peptic ulcer    years ago  . Wears hearing aid in both ears     Past Surgical History:  Procedure Laterality Date  . CATARACT EXTRACTION W/ INTRAOCULAR LENS IMPLANT Left 11/2010  . CATARACT EXTRACTION W/PHACO Right 10/08/2019   Procedure: CATARACT EXTRACTION PHACO AND INTRAOCULAR LENS PLACEMENT ([place]) RIGHT;  Surgeon: Birder Robson, MD;  Location: [facility];  Service: Ophthalmology;  Laterality: Right;  7.85 0:46.3  . CHONDROPLASTY Right 04/10/2019   Procedure: CHONDROPLASTY;  Surgeon: Dereck Leep, MD;  Location: [facility];  Service: Orthopedics;  Laterality: Right;  . colonoscopy  2010  . dental implant  08/2015  . INGUINAL HERNIA REPAIR Left 05/23/2018   Procedure: LEFT INGUINAL HERNIA REPAIR WITH MESH ERAS PATHWAY;  Surgeon: Jovita Kussmaul, MD;  Location: [facility];  Service: General;  Laterality: Left;  . JOINT REPLACEMENT Left 06/2011   Left knee---Dr Hooten  . KNEE ARTHROSCOPY Right 09/07/2015   Procedure: ARTHROSCOPY KNEE, PARTIAL MEDIAL AND LATERAL MENISECTOMY, chondroplasty of patella femoral.;  Surgeon: Dereck Leep, MD;  Location: [facility];  Service: Orthopedics;  Laterality: Right;  . KNEE ARTHROSCOPY Right 09/25/2017   Procedure: ARTHROSCOPY KNEE;  Surgeon: Dereck Leep, MD;  Location: [facility];  Service:  Orthopedics;  Laterality: Right;  . KNEE ARTHROSCOPY WITH MEDIAL MENISECTOMY Right 09/25/2017   Procedure: KNEE ARTHROSCOPY WITH MEDIAL MENISECTOMY;  Surgeon: Dereck Leep, MD;  Location: [facility];  Service: Orthopedics;  Laterality: Right;  . KNEE ARTHROSCOPY WITH MEDIAL MENISECTOMY Right 04/10/2019   Procedure: KNEE ARTHROSCOPY WITH PARTIAL MEDIAL MENISECTOMY;  Surgeon: Dereck Leep, MD;  Location: [facility];  Service: Orthopedics;  Laterality: Right;  . LUMBAR LAMINECTOMY/DECOMPRESSION MICRODISCECTOMY N/A 06/20/2018   Procedure: Lumbar 3-4, Lumbar 4-5 Laminectomy/Foraminotomy;  Surgeon: Newman Pies, MD;  Location: [facility];  Service: Neurosurgery;  Laterality: N/A;  Lumbar 3-4, Lumbar 4-5 Laminectomy/Foraminotomy  . MENISCUS DEBRIDEMENT Left 05/2010   Dr Roberts Gaudy  . RETINAL LASER PROCEDURE Left 07/2010   Dr Darrick Grinder  . spinal stenosis surgery  2018, 01/10/18, 06/20/18   Metal plate  . TONSILLECTOMY AND ADENOIDECTOMY  1966    Allergies  Allergen Reactions  . Hydrocodone Itching    Social History   Tobacco Use  . Smoking status: Never Smoker  . Smokeless tobacco: Never Used  Substance Use Topics  . Alcohol use: No    Alcohol/week: 0.0 standard drinks    Family History  Problem Relation Age of Onset  . Heart disease Father   . Stroke Father        not certain about this  . Cancer Sister        unknown  . Diabetes Maternal Grandmother   . Obesity Sister    Prior to Admission medications   Medication Sig Start Date End Date Taking?  Authorizing Provider  aspirin EC 81 MG tablet Take 81 mg by mouth daily.    Yes [provider]  atorvastatin (LIPITOR) 20 MG tablet TAKE 1 TABLET BY MOUTH DAILY Patient taking differently: Take 20 mg by mouth daily.  09/17/19  Yes Venia Carbon, MD  Calcium Citrate-Vitamin D (CALCIUM + D PO) Take 1 tablet by mouth daily.    Yes [provider]  losartan-hydrochlorothiazide (HYZAAR) 100-12.5 MG tablet TAKE ONE TABLET  BY MOUTH EVERY DAY Patient taking differently: Take 1 tablet by mouth daily.  09/17/19  Yes Viviana Simpler I, MD  Magnesium 250 MG TABS Take 250 mg by mouth daily.    Yes [provider]  tamsulosin (FLOMAX) 0.4 MG CAPS capsule Take 0.8 mg by mouth at bedtime.    Yes [provider]  diphenhydrAMINE HCl, Sleep, (UNISOM SLEEPGELS) 50 MG CAPS Take by mouth. Patient not taking: Reported on 11/15/2019    [provider]     Review of Systems  Positive ROS: As above  All other systems have been reviewed and were otherwise negative with the exception of those mentioned in the HPI and as above.  Objective: Vital signs in last 24 hours: Temp:  [97.9 F (36.6 C)] 97.9 F (36.6 C) (08/04 0846) Pulse Rate:  [64] 64 (08/04 0846) Resp:  [19] 19 (08/04 0846) BP: (145)/(70) 145/70 (08/04 0846) SpO2:  [97 %] 97 % (08/04 0846) Weight:  [108.9 kg] 108.9 kg (08/04 0846) Estimated body mass index is 32.55 kg/m as calculated from the following:   Height as of this encounter: 6' (1.829 m).   Weight as of this encounter: 108.9 kg.   General Appearance: Alert Head: Normocephalic, without obvious abnormality, atraumatic Eyes: PERRL, conjunctiva/corneas clear, EOM's intact,    Ears: Normal  Throat: Normal  Neck: Supple, Back: unremarkable Lungs: Clear to auscultation bilaterally, respirations unlabored Heart: Regular rate and rhythm, no murmur, rub or gallop Abdomen: Soft, non-tender Extremities: Extremities normal, atraumatic, no cyanosis or edema Skin: unremarkable  NEUROLOGIC:   Mental status: alert and oriented,Motor Exam - grossly normal Sensory Exam - grossly normal Reflexes:  Coordination - grossly normal Gait - grossly normal Balance - grossly normal Cranial Nerves: I: smell Not tested  II: visual acuity  OS: Normal  OD: Normal   II: visual fields Full to confrontation  II: pupils Equal, round, reactive to light  III,VII: ptosis None  III,IV,VI:  extraocular muscles  Full ROM  V: mastication Normal  V: facial light touch sensation  Normal  V,VII: corneal reflex  Present  VII: facial muscle function - upper  Normal  VII: facial muscle function - lower Normal  VIII: hearing Not tested  IX: soft palate elevation  Normal  IX,X: gag reflex Present  XI: trapezius strength  5/5  XI: sternocleidomastoid strength 5/5  XI: neck flexion strength  5/5  XII: tongue strength  Normal    Data Review Lab Results  Component Value Date   WBC 7.8 11/20/2019   HGB 13.5 11/20/2019   HCT 42.1 11/20/2019   MCV 90.1 11/20/2019   PLT 255 11/20/2019   Lab Results  Component Value Date   NA 138 11/20/2019   K 4.2 11/20/2019   CL 107 11/20/2019   CO2 23 11/20/2019   BUN 14 11/20/2019   CREATININE 0.80 11/20/2019   GLUCOSE 118 (H) 11/20/2019   Lab Results  Component Value Date   INR 0.9 06/20/2011    Assessment/Plan: Cervical spondylosis, cervical stenosis, cervical myelopathy,  cervical radiculopathy, cervicalgia: I have discussed the situation with the patient.  I have reviewed his imaging studies with him and pointed out the abnormalities.  We have discussed the various treatment options including surgery.  I described the surgical treatment option of a C4-5 and C5-6 anterior cervical discectomy, fusion and plating, with an exploration of an old fusion and possible removal of old plate..  I have shown him surgical models.  I have given him a surgical pamphlet.  We have discussed the risk, benefits, alternatives, expected postoperative course, and likelihood of achieving our goals with surgery.  I have answered all his questions.  He has decided to proceed with surgery.   Ophelia Charter 11/27/2019 11:11 AM

## 2019-11-27 NOTE — Transfer of Care (Signed)
Immediate Anesthesia Transfer of Care Note  Patient: Evan Giles  Procedure(s) Performed: Cervical Four-Five Cervical Five-Six Anterior cervical decompression/discectomy/fusion with interbody prosthesis, plate, screws, explore fusion, removal of hardware (N/A Spine Cervical)  Patient Location: PACU  Anesthesia Type:General  Level of Consciousness: awake and alert   Airway & Oxygen Therapy: Patient Spontanous Breathing and Patient connected to face mask oxygen  Post-op Assessment: Report given to RN and Post -op Vital signs reviewed and stable  Post vital signs: Reviewed and stable  Last Vitals:  Vitals Value Taken Time  BP 147/57 11/27/19 1451  Temp    Pulse 69 11/27/19 1452  Resp 16 11/27/19 1452  SpO2 97 % 11/27/19 1452  Vitals shown include unvalidated device data.  Last Pain:  Vitals:   11/27/19 0923  TempSrc:   PainSc: 3          Complications: No complications documented.

## 2019-11-27 NOTE — Anesthesia Procedure Notes (Signed)
Procedure Name: Intubation Date/Time: 11/27/2019 11:33 AM Performed by: Inda Coke, CRNA Pre-anesthesia Checklist: Patient identified, Emergency Drugs available, Suction available and Patient being monitored Patient Re-evaluated:Patient Re-evaluated prior to induction Oxygen Delivery Method: Circle System Utilized Preoxygenation: Pre-oxygenation with 100% oxygen Induction Type: IV induction Ventilation: Mask ventilation without difficulty and Oral airway inserted - appropriate to patient size Laryngoscope Size: Glidescope and 4 Grade View: Grade I Tube type: Oral Tube size: 7.5 mm Number of attempts: 1 Airway Equipment and Method: Stylet and Oral airway Placement Confirmation: ETT inserted through vocal cords under direct vision,  positive ETCO2 and breath sounds checked- equal and bilateral Secured at: 23 cm Tube secured with: Tape Dental Injury: Teeth and Oropharynx as per pre-operative assessment

## 2019-11-27 NOTE — Anesthesia Preprocedure Evaluation (Addendum)
Anesthesia Evaluation  Patient identified by MRN, date of birth, ID band Patient awake    Reviewed: Allergy & Precautions, NPO status , Patient's Chart, lab work & pertinent test results  Airway Mallampati: II  TM Distance: >3 FB Neck ROM: Full    Dental no notable dental hx. (+) Teeth Intact, Dental Advisory Given   Pulmonary neg pulmonary ROS,    Pulmonary exam normal breath sounds clear to auscultation       Cardiovascular hypertension, Normal cardiovascular exam Rhythm:Regular Rate:Normal  HLD   Neuro/Psych negative neurological ROS  negative psych ROS   GI/Hepatic Neg liver ROS, PUD,   Endo/Other  negative endocrine ROS  Renal/GU negative Renal ROS  negative genitourinary   Musculoskeletal  (+) Arthritis ,   Abdominal   Peds  Hematology negative hematology ROS (+)   Anesthesia Other Findings   Reproductive/Obstetrics                            Anesthesia Physical Anesthesia Plan  ASA: II  Anesthesia Plan: General   Post-op Pain Management:    Induction: Intravenous  PONV Risk Score and Plan: 2 and Dexamethasone, Ondansetron, Midazolam and Treatment may vary due to age or medical condition  Airway Management Planned: Oral ETT and Video Laryngoscope Planned  Additional Equipment:   Intra-op Plan:   Post-operative Plan: Extubation in OR  Informed Consent: I have reviewed the patients History and Physical, chart, labs and discussed the procedure including the risks, benefits and alternatives for the proposed anesthesia with the patient or authorized representative who has indicated his/her understanding and acceptance.     Dental advisory given  Plan Discussed with: CRNA  Anesthesia Plan Comments:        Anesthesia Quick Evaluation

## 2019-11-27 NOTE — Op Note (Signed)
Brief history: The patient is a 79 year old white male on whom I performed a C3-4 anterior cervicectomy, fusion and plating for herniated disc and myelopathy.  The patient initially did well but has developed recurrent arm pain, numbness, tingling and weakness.  He was worked up with a cervical MRI which demonstrated degenerative changes diffusely but most prominent at C4-5 and C5-6.  I discussed the various treatment options with him.  He is decided to proceed with surgery after weighing the risk, benefits and alternatives.  Preoperative diagnosis: C4-5 herniated disc, C5-6 spondylosis, cervical myelopathy, cervical radiculopathy, cervicalgia, cervical spinal stenosis  Postoperative diagnosis: The same  Procedure: C4-5 and C5-6 anterior cervical discectomy/decompression; C4-5 and C5-6 interbody arthrodesis with local morcellized autograft bone and Zimmer DBM; insertion of interbody prosthesis at C4-5 and C5-6 (Zimmer peek interbody prosthesis); anterior cervical plating from C4-5 and C5-6 with globus titanium plate; explore fusion/removal of old cervical plate at X7-2  Surgeon: Dr. Earle Gell  Asst.: Dr. Lorin Glass and Arnetha Massy, NP  Anesthesia: Gen. endotracheal  Estimated blood loss: 125 cc  Drains: None  Complications: None  Description of procedure: The patient was brought to the operating room by the anesthesia team. General endotracheal anesthesia was induced. A roll was placed under the patient's shoulders to keep the neck in the neutral position. The patient's anterior cervical region was then prepared with Betadine scrub and Betadine solution. Sterile drapes were applied.  The area to be incised was then injected with Marcaine with epinephrine solution. I then used a scalpel to make a transverse incision in the patient's left anterior neck. I used the Metzenbaum scissors to divide the platysmal muscle and then to dissect medial to the sternocleidomastoid muscle, jugular vein,  and carotid artery. I carefully dissected down towards the anterior cervical spine identifying the esophagus and retracting it medially. Then using Kitner swabs to clear soft tissue from the anterior cervical spine, exposing the old plate at I2-0.  We explored the fusion by locking the cams and removing the screws and plate from B5-5.  The arthrodesis appeared solid.   I then used electrocautery to detach the medial border of the longus colli muscle bilaterally from the C4-5 and C5-6 intervertebral disc spaces. I then inserted the Caspar self-retaining retractor underneath the longus colli muscle bilaterally to provide exposure.  We then incised the intervertebral disc at C4-5 and C5-6. We then performed a partial intervertebral discectomy with a pituitary forceps and the Karlin curettes. I then inserted distraction screws into the vertebral bodies at C4-5. We then distracted the interspace. We then used the high-speed drill to decorticate the vertebral endplates at H7-4  to drill away the remainder of the intervertebral disc, to drill away some posterior spondylosis, and to thin out the posterior longitudinal ligament. I then incised ligament with the arachnoid knife. We then removed the ligament with a Kerrison punches undercutting the vertebral endplates and decompressing the thecal sac. We then performed foraminotomies about the bilateral C5  nerve roots. This completed the decompression at this level.  We then repeated this procedure in analogous fashion at C5-6 decompressing the thecal sac and the bilateral C6 nerve roots.  We now turned our to attention to the interbody fusion. We used the trial spacers to determine the appropriate size for the interbody prosthesis. We then pre-filled prosthesis with a combination of local morcellized autograft bone that we obtained during decompression as well as Zimmer DBM. We then inserted the prosthesis into the distracted interspace at C4-5 and  C5-6. We then  removed the distraction screws. There was a good snug fit of the prosthesis in the interspace.  Having completed the fusion we now turned attention to the anterior spinal instrumentation. We used the high-speed drill to drill away some anterior spondylosis at the disc spaces so that the plate lay down flat. We selected the appropriate length titanium anterior cervical plate. We laid it along the anterior aspect of the vertebral bodies from C4-C6. We then drilled 14 mm holes at C5 and C6, we used the old screw holes at C4. We then secured the plate to the vertebral bodies by placing two 14 mm self-tapping screws at C4, C5 and C6. We then obtained intraoperative radiograph. The demonstrating good position of the upper instrumentation.  We cannot see the lower plate and screws because of the patient's shoulders.  But it looked good in vivo.  We therefore secured the screws the plate the locking each cam. This completed the instrumentation.  We then obtained hemostasis using bipolar electrocautery. We irrigated the wound out with bacitracin solution. We then removed the retractor. We inspected the esophagus for any damage. There was none apparent. We then reapproximated patient's platysmal muscle with interrupted 3-0 Vicryl suture. We then reapproximated the subcutaneous tissue with interrupted 3-0 Vicryl suture. The skin was reapproximated with Steri-Strips and benzoin. The wound was then covered with bacitracin ointment. A sterile dressing was applied. The drapes were removed. Patient was subsequently extubated by the anesthesia team and transported to the post anesthesia care unit in stable condition. All sponge instrument and needle counts were reportedly correct at the end of this case.

## 2019-11-28 DIAGNOSIS — H9190 Unspecified hearing loss, unspecified ear: Secondary | ICD-10-CM | POA: Diagnosis not present

## 2019-11-28 DIAGNOSIS — M50121 Cervical disc disorder at C4-C5 level with radiculopathy: Secondary | ICD-10-CM | POA: Diagnosis not present

## 2019-11-28 DIAGNOSIS — I10 Essential (primary) hypertension: Secondary | ICD-10-CM | POA: Diagnosis not present

## 2019-11-28 DIAGNOSIS — M4712 Other spondylosis with myelopathy, cervical region: Secondary | ICD-10-CM | POA: Diagnosis not present

## 2019-11-28 DIAGNOSIS — E785 Hyperlipidemia, unspecified: Secondary | ICD-10-CM | POA: Diagnosis not present

## 2019-11-28 DIAGNOSIS — Z7982 Long term (current) use of aspirin: Secondary | ICD-10-CM | POA: Diagnosis not present

## 2019-11-28 DIAGNOSIS — M50021 Cervical disc disorder at C4-C5 level with myelopathy: Secondary | ICD-10-CM | POA: Diagnosis not present

## 2019-11-28 DIAGNOSIS — M4722 Other spondylosis with radiculopathy, cervical region: Secondary | ICD-10-CM | POA: Diagnosis not present

## 2019-11-28 DIAGNOSIS — Z79899 Other long term (current) drug therapy: Secondary | ICD-10-CM | POA: Diagnosis not present

## 2019-11-28 DIAGNOSIS — M4802 Spinal stenosis, cervical region: Secondary | ICD-10-CM | POA: Diagnosis not present

## 2019-11-28 MED ORDER — DOCUSATE SODIUM 100 MG PO CAPS: 100.0000 mg | ORAL_CAPSULE | Freq: Two times a day (BID) | ORAL | 0 refills | Status: DC

## 2019-11-28 MED ORDER — OXYCODONE-ACETAMINOPHEN 5-325 MG PO TABS: 1.0000 | ORAL_TABLET | ORAL | Status: DC | PRN

## 2019-11-28 MED ORDER — OXYCODONE-ACETAMINOPHEN 5-325 MG PO TABS: 1.0000 | ORAL_TABLET | ORAL | 0 refills | Status: DC | PRN

## 2019-11-28 MED ORDER — CYCLOBENZAPRINE HCL 10 MG PO TABS: 10.0000 mg | ORAL_TABLET | Freq: Three times a day (TID) | ORAL | 0 refills | Status: DC | PRN

## 2019-11-28 NOTE — Discharge Instructions (Signed)

## 2019-11-28 NOTE — Evaluation (Signed)
Physical Therapy Evaluation Patient Details Name: Evan Giles MRN: [id number] DOB: 02/09/1941 Today's Date: 11/28/2019   History of Present Illness  Pt is a 79 y/o male s/p ACDF C4-6. PMH including but not limited to HLD and HTN.  Clinical Impression  Pt presented sitting upright at EOB, awake and willing to participate in therapy session. Prior to admission, pt reported that he ambulated with a cane and was independent with ADLs. Pt lives with his wife in a single level home with a couple of steps to enter. At the time of evaluation, pt overall at a supervision level with functional mobility including hallway ambulation with use of a cane. Additionally, he participated in stair training this session without difficulties. PT provided pt education re: cervical precautions with handout provided and a generalized walking program for pt to initiate upon d/c home. No further acute PT needs identified at this time. PT signing off.     Follow Up Recommendations No PT follow up    Equipment Recommendations  None recommended by PT    Recommendations for Other Services       Precautions / Restrictions Precautions Precautions: Cervical;Fall Precaution Booklet Issued: Yes (comment) Required Braces or Orthoses: Cervical Brace Cervical Brace: Hard collar Restrictions Weight Bearing Restrictions: No      Mobility  Bed Mobility               General bed mobility comments: pt seated EOB upon arrival  Transfers Overall transfer level: Needs assistance Equipment used: Straight cane Transfers: Sit to/from Stand Sit to Stand: Supervision         General transfer comment: no instability or LOB  Ambulation/Gait Ambulation/Gait assistance: Supervision Gait Distance (Feet): 200 Feet Assistive device: Straight cane Gait Pattern/deviations: Step-through pattern Gait velocity: decreased   General Gait Details: pt with mild instability but no overt LOB or need for physical  assistance, supervision for safety  Stairs Stairs: Yes Stairs assistance: Min guard Stair Management: One rail Left;Step to pattern;Forwards;With cane Number of Stairs: 3 General stair comments: no instability or LOB, min guard for safety  Wheelchair Mobility    Modified Rankin (Stroke Patients Only)       Balance Overall balance assessment: Needs assistance Sitting-balance support: Feet supported Sitting balance-Leahy Scale: Good     Standing balance support: During functional activity Standing balance-Leahy Scale: Fair                               Pertinent Vitals/Pain Pain Assessment: Faces Faces Pain Scale: Hurts a little bit Pain Location: neck Pain Descriptors / Indicators: Sore Pain Intervention(s): Monitored during session;Repositioned    Home Living Family/patient expects to be discharged to:: Private residence Living Arrangements: Spouse/significant other Available Help at Discharge: Family;Available 24 hours/day Type of Home: House Home Access: Stairs to enter   [employer] of Steps: 3 Home Layout: One level Home Equipment: Cane - single point      Prior Function Level of Independence: Independent with assistive device(s)         Comments: ambulates with a cane     Hand Dominance        Extremity/Trunk Assessment   Upper Extremity Assessment Upper Extremity Assessment: Defer to OT evaluation;Overall WFL for tasks assessed    Lower Extremity Assessment Lower Extremity Assessment: Overall WFL for tasks assessed    Cervical / Trunk Assessment Cervical / Trunk Assessment: Other exceptions Cervical / Trunk Exceptions: s/p cervical sx  Communication   Communication: No difficulties  Cognition Arousal/Alertness: Awake/alert Behavior During Therapy: WFL for tasks assessed/performed Overall Cognitive Status: Within Functional Limits for tasks assessed                                         General Comments      Exercises     Assessment/Plan    PT Assessment Patent does not need any further PT services  PT Problem List         PT Treatment Interventions      PT Goals (Current goals can be found in the Care Plan section)  Acute Rehab PT Goals Patient Stated Goal: "home today" PT Goal Formulation: All assessment and education complete, DC therapy    Frequency     Barriers to discharge        Co-evaluation               AM-PAC PT "6 Clicks" Mobility  Outcome Measure Help needed turning from your back to your side while in a flat bed without using bedrails?: None Help needed moving from lying on your back to sitting on the side of a flat bed without using bedrails?: None Help needed moving to and from a bed to a chair (including a wheelchair)?: None Help needed standing up from a chair using your arms (e.g., wheelchair or bedside chair)?: None Help needed to walk in hospital room?: None Help needed climbing 3-5 steps with a railing? : A Little 6 Click Score: 23    End of Session Equipment Utilized During Treatment: Cervical collar Activity Tolerance: Patient tolerated treatment well Patient left: in chair;with call bell/phone within reach Nurse Communication: Mobility status PT Visit Diagnosis: Other abnormalities of gait and mobility (R26.89)    Time: 8295-6213 PT Time Calculation (min) (ACUTE ONLY): 11 min   Charges:   PT Evaluation $PT Eval Low Complexity: 1 Low          Eduard Clos, PT, DPT  Acute Rehabilitation Services Pager [phone] Office [place] 11/28/2019, 8:51 AM

## 2019-11-28 NOTE — Progress Notes (Signed)
Patient is discharged from room 3C03 at this time. Alert and in stable condition. IV site d/c'd and instructions read to patient and spouse with understanding verbalized and all questions answered. Left unit via wheelchair with all belongings at side. ?

## 2019-11-28 NOTE — Discharge Summary (Signed)
Physician Discharge Summary  Patient ID: Evan Giles MRN: [id number] DOB/AGE: 1941-04-06 79 y.o.  Admit date: 11/27/2019 Discharge date: 11/28/2019  Admission Diagnoses: Cervical spondylosis, cervical stenosis, cervical radiculopathy, cervical myelopathy, cervicalgia  Discharge Diagnoses: The same Active Problems:   Cervical spondylosis with myelopathy and radiculopathy   Discharged Condition: good  Hospital Course: I performed a C4-5 and C5-6 anterior cervicectomy, fusion and plating on the patient on 11/27/2019.  The surgery went well.  The patient's postoperative course was unremarkable.  On postoperative day #1 he requested discharge to home.  He was given written and oral discharge instructions.  All his questions were answered.  Consults: OT, care management Significant Diagnostic Studies: None Treatments: C4-5 and C5-6 anterior cervical discectomy, fusion, plating exploration of cervical fusion/removal of cervical hardware Discharge Exam: Blood pressure (!) 107/41, pulse 72, temperature 98.5 F (36.9 C), temperature source Oral, resp. rate 18, height 6' (1.829 m), weight 108.9 kg, SpO2 97 %. The patient is alert and pleasant.  He looks well.  His dressing has a small bloodstain.  There is no hematoma or shift.  His strength is normal.  His voice is normal.  Disposition: Home  Discharge Instructions    Call MD for:  difficulty breathing, headache or visual disturbances   Complete by: As directed    Call MD for:  extreme fatigue   Complete by: As directed    Call MD for:  hives   Complete by: As directed    Call MD for:  persistant dizziness or light-headedness   Complete by: As directed    Call MD for:  persistant nausea and vomiting   Complete by: As directed    Call MD for:  redness, tenderness, or signs of infection (pain, swelling, redness, odor or green/yellow discharge around incision site)   Complete by: As directed    Call MD for:  severe uncontrolled pain    Complete by: As directed    Call MD for:  temperature >100.4   Complete by: As directed    Diet - low sodium heart healthy   Complete by: As directed    Discharge instructions   Complete by: As directed    Call [phone] for a followup appointment. Take a stool softener while you are using pain medications.   Driving Restrictions   Complete by: As directed    Do not drive for 2 weeks.   Increase activity slowly   Complete by: As directed    Lifting restrictions   Complete by: As directed    Do not lift more than 5 pounds. No excessive bending or twisting.   May shower / Bathe   Complete by: As directed    Remove the dressing for 3 days after surgery.  You may shower, but leave the incision alone.   Remove dressing in 48 hours   Complete by: As directed      Allergies as of 11/28/2019      Reactions   Hydrocodone Itching      Medication List    TAKE these medications   aspirin EC 81 MG tablet Take 81 mg by mouth daily.   atorvastatin 20 MG tablet Commonly known as: LIPITOR TAKE 1 TABLET BY MOUTH DAILY   CALCIUM + D PO Take 1 tablet by mouth daily.   cyclobenzaprine 10 MG tablet Commonly known as: FLEXERIL Take 1 tablet (10 mg total) by mouth 3 (three) times daily as needed for muscle spasms.   docusate sodium 100 MG capsule  Commonly known as: COLACE Take 1 capsule (100 mg total) by mouth 2 (two) times daily.   losartan-hydrochlorothiazide 100-12.5 MG tablet Commonly known as: HYZAAR TAKE ONE TABLET BY MOUTH EVERY DAY   Magnesium 250 MG Tabs Take 250 mg by mouth daily.   oxyCODONE-acetaminophen 5-325 MG tablet Commonly known as: PERCOCET/ROXICET Take 1-2 tablets by mouth every 4 (four) hours as needed for moderate pain.   tamsulosin 0.4 MG Caps capsule Commonly known as: FLOMAX Take 0.8 mg by mouth at bedtime.   Unisom Sleepgels 50 MG Caps Generic drug: diphenhydrAMINE HCl (Sleep) Take by mouth.        Signed: Ophelia Charter 11/28/2019, 9:07  AM

## 2019-11-28 NOTE — Evaluation (Signed)
Occupational Therapy Evaluation Patient Details Name: Evan Giles MRN: [id number] DOB: April 17, 1941 Today's Date: 11/28/2019    History of Present Illness Pt is a 79 y/o male s/p ACDF C4-6. PMH including but not limited to HLD and HTN.   Clinical Impression   [name] is a 79 year old man s/p ACDF c4-6 who reports soreness in neck and bilateral upper extremity numbness and with cervical spinal precautions. Patient demonstrates functional upper extremity ROM, strength and coordination and able to perform most of his ADLs. Patient instructed on spinal precautions, how to donn and doff cervical brace and compensatory strategies for ADLs to improve independence and safety at home. Patient required assistance to donn socks and shoes due to limited knee ROM and spinal precautions. Patient reports he will be wearing slip on shoes and has assistance of his wife at home. Patient verbalized understanding, reports he is familiar with precautions from prior surgery and provided with handout to maximize retention of education. No DME recommendations or further OT needs.    Follow Up Recommendations  No OT follow up    Equipment Recommendations  None recommended by OT    Recommendations for Other Services       Precautions / Restrictions Precautions Precautions: Cervical;Fall Precaution Booklet Issued: Yes (comment) Precaution Comments: Hx of poor balance Required Braces or Orthoses: Cervical Brace Cervical Brace: Hard collar Restrictions Weight Bearing Restrictions: No      Mobility Bed Mobility               General bed mobility comments: Patient in recliner when therapist entered room.  Transfers Overall transfer level: Needs assistance Equipment used: Straight cane Transfers: Sit to/from Stand Sit to Stand: Min guard         General transfer comment: Min guard for ambulation in room and use of cane. Patient has a history of impaired balance he reports from  unknown cause. Mild loss of balance requiring him to use his other arm (not holding the cane) to steady himself on furniture.    Balance Overall balance assessment: Mild deficits observed, not formally tested Sitting-balance support: No upper extremity supported;Feet supported Sitting balance-Leahy Scale: Good     Standing balance support: During functional activity;Single extremity supported Standing balance-Leahy Scale: Poor                             ADL either performed or assessed with clinical judgement   ADL Overall ADL's : Needs assistance/impaired Eating/Feeding: Independent   Grooming: Set up;Cueing for compensatory techniques;Oral care;Standing;Min guard Grooming Details (indicate cue type and reason): Performed grooming standing at sink. Patient instructed on compensatory strategy with multiple cups to reduce neck flexion. Upper Body Bathing: Set up;Cueing for compensatory techniques;Sitting   Lower Body Bathing: Minimal assistance;Min guard;Sit to/from stand Lower Body Bathing Details (indicate cue type and reason): Instructed patient on spinal precautions to reduce bending with bathing. Upper Body Dressing : Sitting;Cueing for compensatory techniques;Supervision/safety Upper Body Dressing Details (indicate cue type and reason): Patient donned button up shirt without assistance. Patient demonstrated ability to donn and doff cervical brace. Lower Body Dressing: Moderate assistance;Sit to/from stand;Min guard Lower Body Dressing Details (indicate cue type and reason): Patient needed assistance to donn socks and shoes due to spinal precautions and limited knee ROM. Patient reprots his wife can help him - but predominantly will use slip on shoes. Toilet Transfer: Public affairs consultant and Hygiene: Min guard  Functional mobility during ADLs: Min guard;Cane General ADL Comments: min guard for ambulation. Mild imbalance  that patient reports is chronic.     Vision   Vision Assessment?: No apparent visual deficits     Perception     Praxis      Pertinent Vitals/Pain Pain Assessment: Faces Faces Pain Scale: Hurts a little bit Pain Location: neck Pain Descriptors / Indicators: Sore Pain Intervention(s): Monitored during session     Hand Dominance     Extremity/Trunk Assessment Upper Extremity Assessment Upper Extremity Assessment: Overall WFL for tasks assessed;RUE deficits/detail;LUE deficits/detail (Reports bilateral upper extremity numbness since prior to surgery.)   Lower Extremity Assessment Lower Extremity Assessment: Defer to PT evaluation   Cervical / Trunk Assessment Cervical / Trunk Assessment: Other exceptions Cervical / Trunk Exceptions: s/p cervical sx   Communication Communication Communication: No difficulties   Cognition Arousal/Alertness: Awake/alert Behavior During Therapy: WFL for tasks assessed/performed Overall Cognitive Status: Within Functional Limits for tasks assessed                                     General Comments       Exercises     Shoulder Instructions      Home Living Family/patient expects to be discharged to:: Private residence Living Arrangements: Spouse/significant other Available Help at Discharge: Family;Available 24 hours/day Type of Home: House Home Access: Stairs to enter [employer] of Steps: 3   Home Layout: One level     Bathroom Shower/Tub: Teacher, early years/pre: Handicapped height     Home Equipment: [place] - single point   Additional Comments: Has a shoe horn.      Prior Functioning/Environment Level of Independence: Independent with assistive device(s)        Comments: ambulates with a cane        OT Problem List: Impaired balance (sitting and/or standing);Pain      OT Treatment/Interventions:      OT Goals(Current goals can be found in the care plan section) Acute  Rehab OT Goals Patient Stated Goal: To go home today OT Goal Formulation: With patient  OT Frequency:     Barriers to D/C:            Co-evaluation              AM-PAC OT "6 Clicks" Daily Activity     Outcome Measure Help from another person eating meals?: None Help from another person taking care of personal grooming?: A Little Help from another person toileting, which includes using toliet, bedpan, or urinal?: A Little Help from another person bathing (including washing, rinsing, drying)?: A Little Help from another person to put on and taking off regular upper body clothing?: None Help from another person to put on and taking off regular lower body clothing?: A Lot 6 Click Score: 19   End of Session Nurse Communication:  (No OT needs at discharge.)  Activity Tolerance: Patient tolerated treatment well Patient left: in chair;with call bell/phone within reach  OT Visit Diagnosis: Unsteadiness on feet (R26.81);Pain Pain - part of body:  (neck)                Time: 4268-3419 OT Time Calculation (min): 23 min Charges:  OT General Charges $OT Visit: 1 Visit OT Evaluation $OT Eval Low Complexity: 1 Low  Emillie Chasen, OTR/L [place]  Office [phone] Pager: [phone]   Derl Barrow  L Abeer Deskins 11/28/2019, 9:30 AM

## 2019-11-28 NOTE — Anesthesia Postprocedure Evaluation (Signed)
Anesthesia Post Note  Patient: Evan Giles  Procedure(s) Performed: Cervical Four-Five Cervical Five-Six Anterior cervical decompression/discectomy/fusion with interbody prosthesis, plate, screws, explore fusion, removal of hardware (N/A Spine Cervical)     Patient location during evaluation: PACU Anesthesia Type: General Level of consciousness: awake and alert Pain management: pain level controlled Vital Signs Assessment: post-procedure vital signs reviewed and stable Respiratory status: spontaneous breathing, nonlabored ventilation, respiratory function stable and patient connected to nasal cannula oxygen Cardiovascular status: blood pressure returned to baseline and stable Postop Assessment: no apparent nausea or vomiting Anesthetic complications: no   No complications documented.  Last Vitals:  Vitals:   11/28/19 0402 11/28/19 0805  BP: 119/60 (!) 107/41  Pulse: 65 72  Resp: 20 18  Temp: 36.8 C 36.9 C  SpO2: 95% 97%    Last Pain:  Vitals:   11/28/19 0810  TempSrc:   PainSc: 3                  Evette Diclemente L Scotty Pinder

## 2019-11-29 ENCOUNTER — Encounter (HOSPITAL_COMMUNITY): Payer: Self-pay | Admitting: Neurosurgery

## 2019-12-17 DIAGNOSIS — M4712 Other spondylosis with myelopathy, cervical region: Secondary | ICD-10-CM | POA: Diagnosis not present

## 2020-03-27 DIAGNOSIS — M4712 Other spondylosis with myelopathy, cervical region: Secondary | ICD-10-CM | POA: Diagnosis not present

## 2020-04-01 ENCOUNTER — Telehealth: Payer: Self-pay

## 2020-04-01 NOTE — Telephone Encounter (Signed)
Evan Sanders NP with [facility] left v/m that Evan Giles saw pt this morning and A1C was 5.8 which is pre diabetic range; the quantaflo for PVD was mild on rt at 0.61 and borderline on lt at 0.90. pt last seen on 08/23/19 for medicare wellness. Sending note to Dr Silvio Pate who is out of office this afternoon but will be back in office on 04/02/20 and [facility] CMA.

## 2020-04-02 NOTE — Telephone Encounter (Signed)
Fasting sugar was 101 at my last visit and he had palpable distal pulses No action needed till his routine visit

## 2020-04-13 IMAGING — MR MR BRAIN/IAC WO/W
10 of 14 series · 27 of 48 positions shown · IV contrast (Gadavist)
Comparison: Head CT 12/21/2017

CLINICAL DATA: Balance disturbance over the last 8-9 months.
Hearing loss.

EXAM:
MRI HEAD WITHOUT AND WITH CONTRAST
TECHNIQUE: Multiplanar, multiecho pulse sequences of the brain and surrounding
structures were obtained without and with intravenous contrast.
CONTRAST:  10 cc Gadavist

[Series 5: T1 · sagittal · 5.0mm · 0.62mm/px · 2 of 25 slices shown (1 of 3)]
[im 1/25]
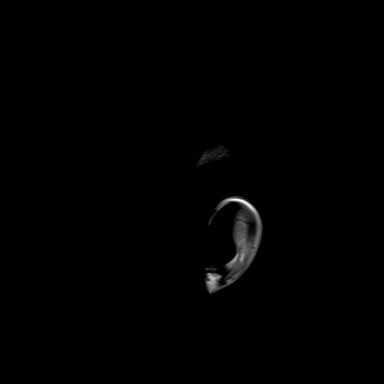
[im 25/25]
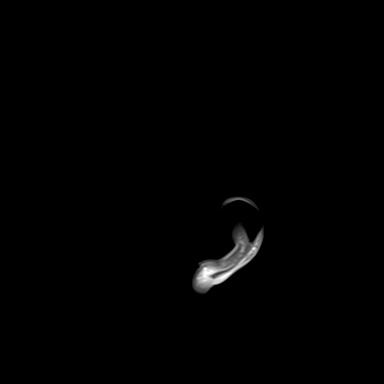

[Series 6: ax dwi_tracew · axial · 3.0mm · 0.73mm/px · z∈[-66,+90]mm · 4 of 53 slices shown]
[im 1/53]
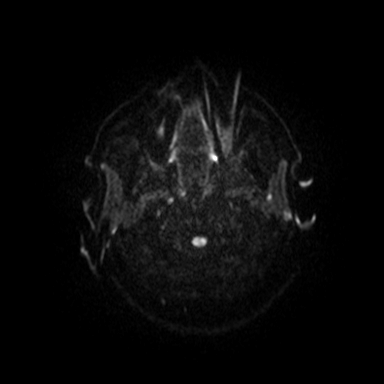
[im 18/53]
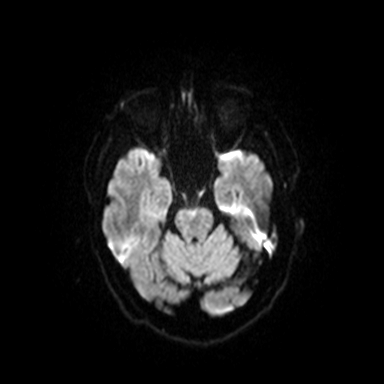
[im 35/53]
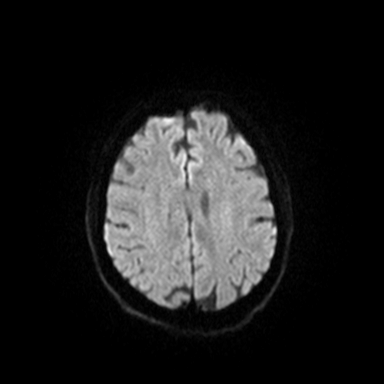
[im 53/53]
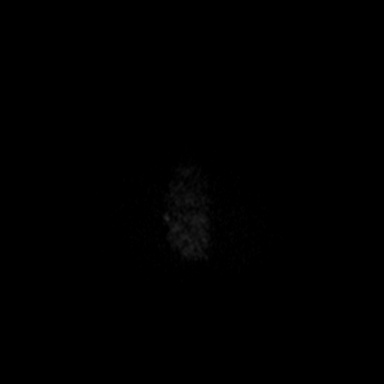

[Series 7: ax dwi_adc · axial · 3.0mm · 0.73mm/px · z∈[-66,-15]mm · 2 of 53 slices shown]
[im 1/53]
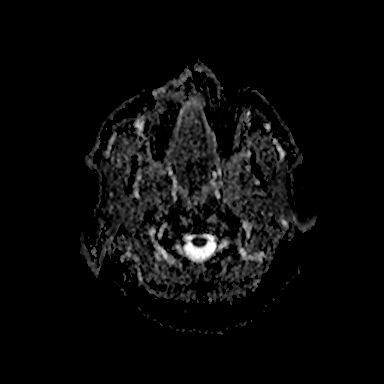
[im 18/53]
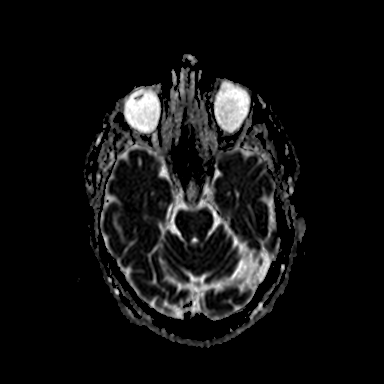

[Series 8: T2 · axial · 5.0mm · 0.53mm/px · z∈[-61,+83]mm · 2 of 25 slices shown]
[im 1/25]
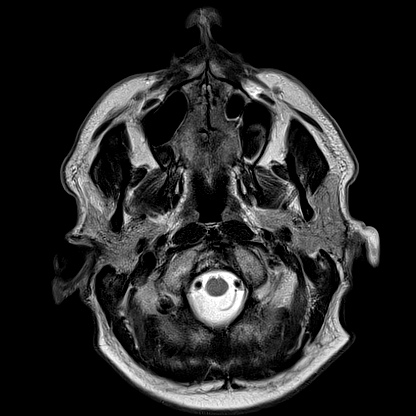
[im 25/25]
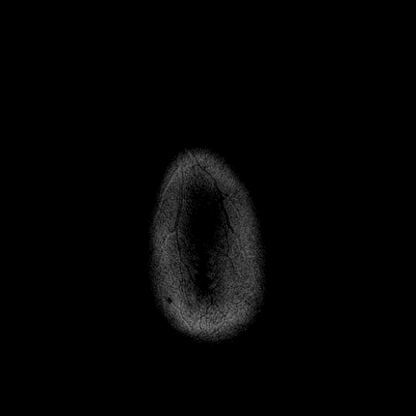

[Series 13: FLAIR · axial · 3.0mm · 0.53mm/px · z∈[-70,+92]mm · 4 of 55 slices shown]
[im 1/55]
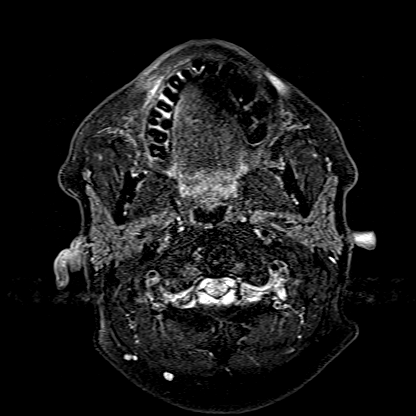
[im 19/55]
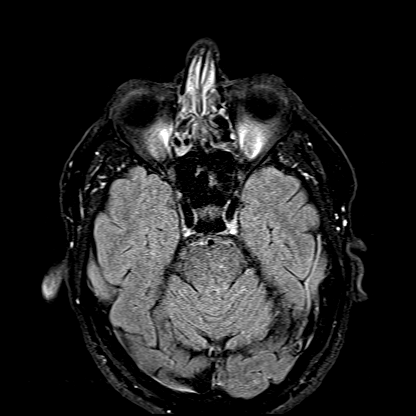
[im 37/55]
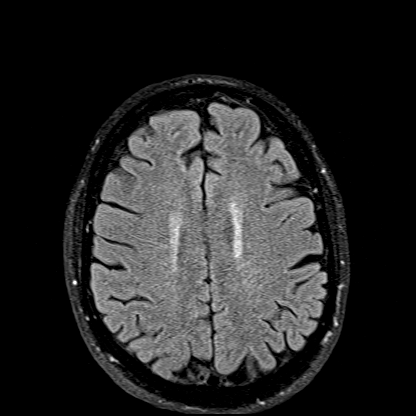
[im 55/55]
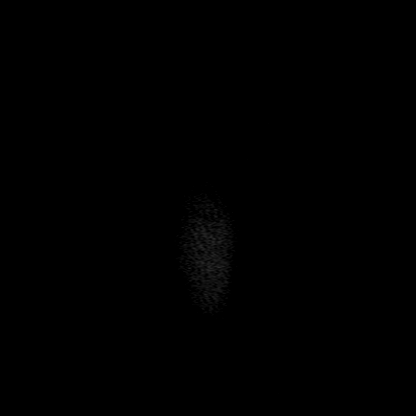

[Series 14: T1 · coronal · non-contrast · 3.0mm · 0.21mm/px · 1 of 13 slices shown (2 of 3)]
[im 1/13]
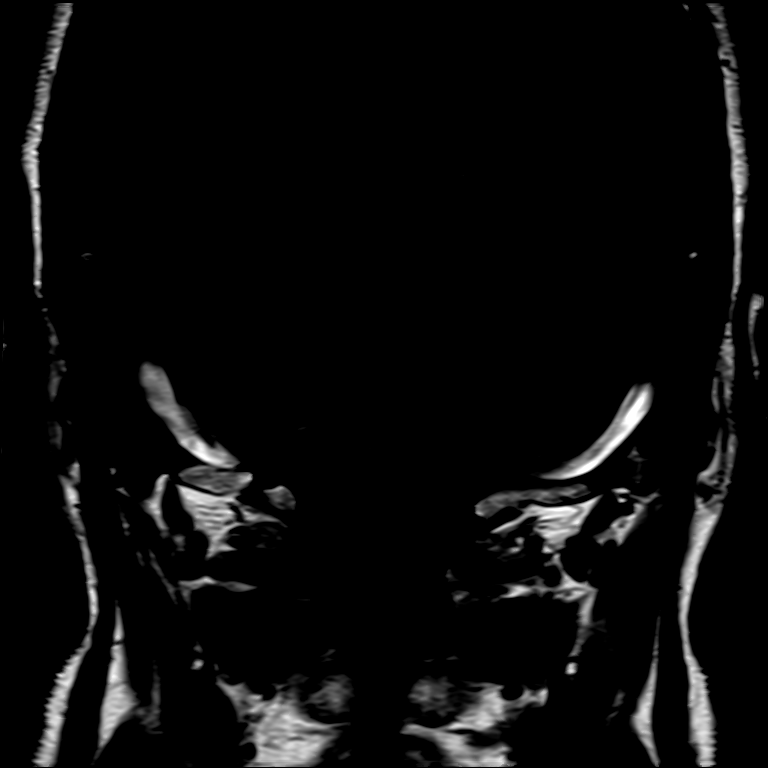

[Series 16: T1 · axial · non-contrast · 3.0mm · 0.21mm/px · 1 of 15 slices shown (3 of 3)]
[im 1/15]
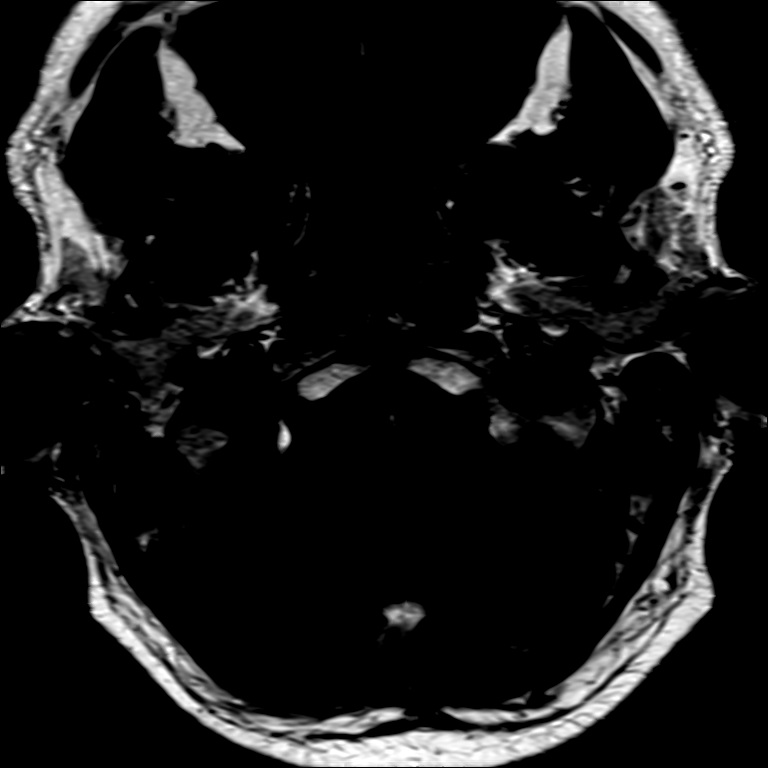

[Series 17: T1 post-contrast · axial · 3.0mm · 0.21mm/px · 1 of 15 slices shown (1 of 3)]
[im 1/15]
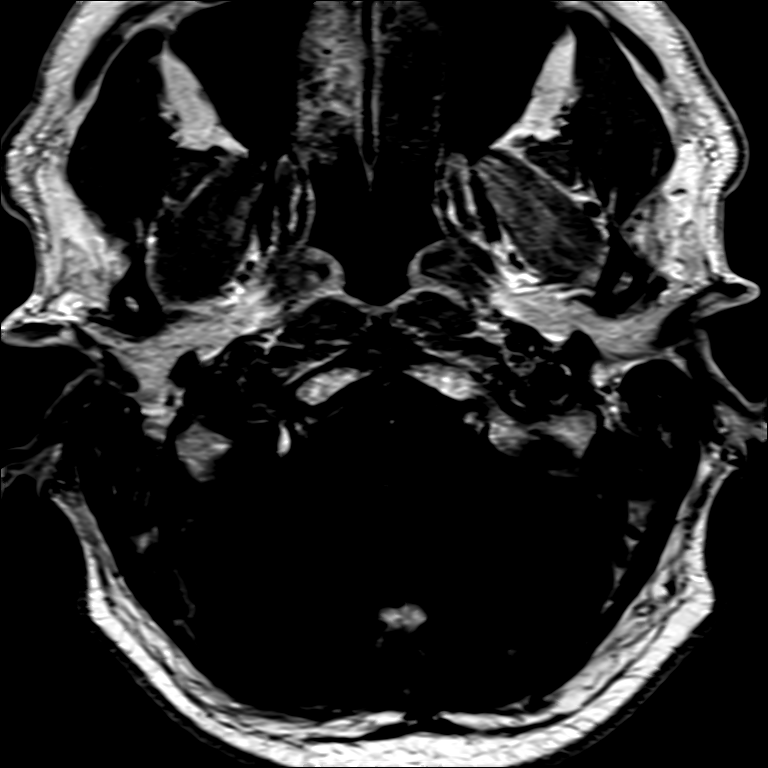

[Series 18: T1 post-contrast · coronal · 3.0mm · 0.21mm/px · 1 of 13 slices shown (2 of 3)]
[im 1/13]
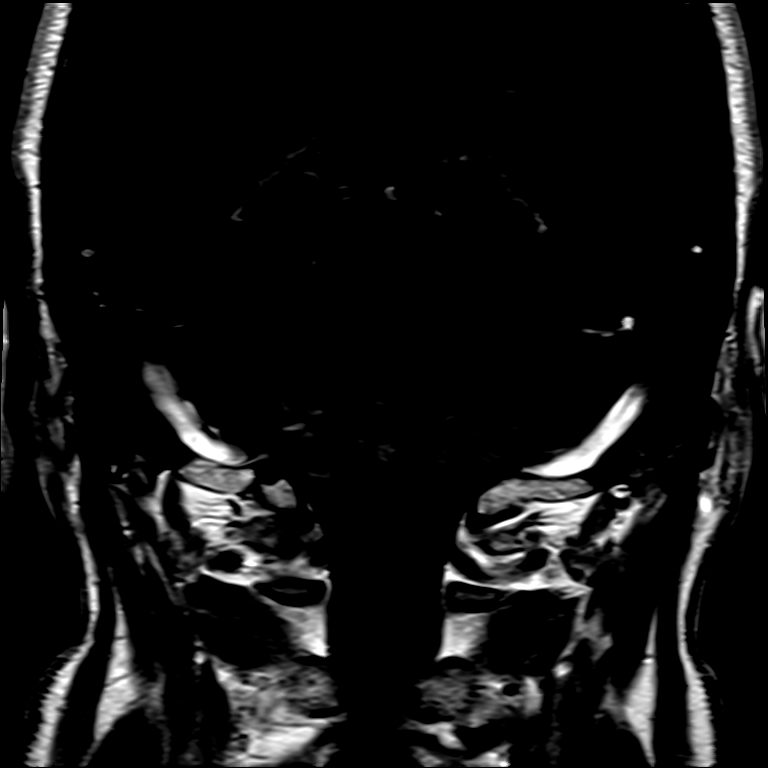

[Series 19: T1 post-contrast · axial · 1.0mm · 0.98mm/px · z∈[-76,+98]mm · 9 of 175 slices shown (3 of 3)]
[im 1/175]
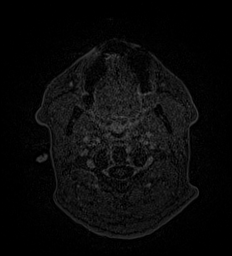
[im 32/175]
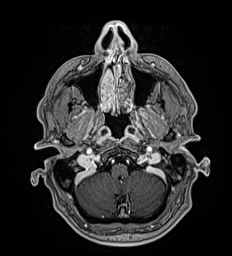
[im 48/175]
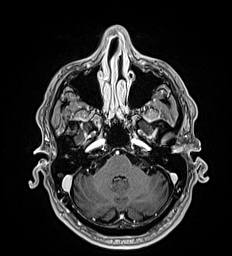
[im 80/175]
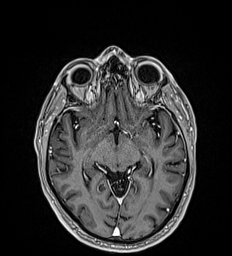
[im 95/175]
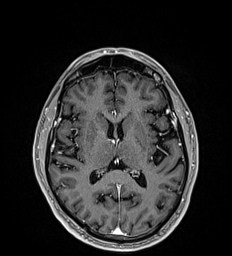
[im 127/175]
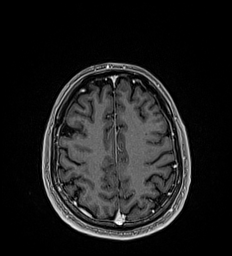
[im 143/175]
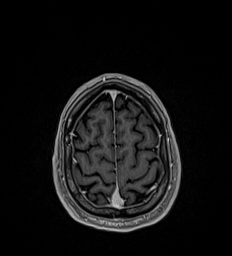
[im 159/175]
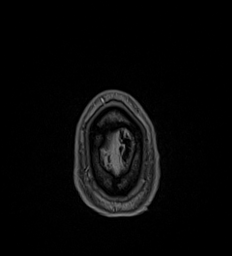
[im 175/175]
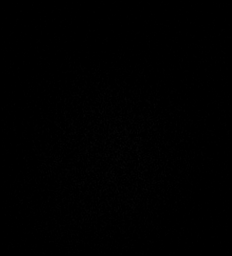

[27 of 48 positions shown; findings below may reference images not displayed]

FINDINGS: Brain: Diffusion imaging does not show any acute or subacute
infarction. The brainstem and cerebellum are normal. CP angle
regions are normal. No evidence of vestibular schwannoma or
enhancing neuritis. Cerebral hemispheres are normal except for
minimal small vessel change of the white matter, less than usually
seen at this age. No mass lesion, hemorrhage, hydrocephalus or
extra-axial collection. No abnormal brain or leptomeningeal
enhancement occurs.

Vascular: Major vessels at the base of the brain show flow.

Skull and upper cervical spine: Negative

Sinuses/Orbits: Clear/normal

Other: None
IMPRESSION: No abnormality seen to explain the presenting symptoms. No
vestibular schwannoma.

Normal appearance of the brain for age, with only very minimal small
vessel change of the hemispheric white matter, less than usually
seen at this age.

## 2020-04-29 DIAGNOSIS — L821 Other seborrheic keratosis: Secondary | ICD-10-CM | POA: Diagnosis not present

## 2020-04-29 DIAGNOSIS — L578 Other skin changes due to chronic exposure to nonionizing radiation: Secondary | ICD-10-CM | POA: Diagnosis not present

## 2020-04-29 DIAGNOSIS — Z86018 Personal history of other benign neoplasm: Secondary | ICD-10-CM | POA: Diagnosis not present

## 2020-04-29 DIAGNOSIS — L400 Psoriasis vulgaris: Secondary | ICD-10-CM | POA: Diagnosis not present

## 2020-04-29 DIAGNOSIS — Z859 Personal history of malignant neoplasm, unspecified: Secondary | ICD-10-CM | POA: Diagnosis not present

## 2020-05-11 ENCOUNTER — Ambulatory Visit: Payer: Medicare Other | Admitting: Internal Medicine

## 2020-05-12 ENCOUNTER — Ambulatory Visit: Payer: Medicare Other | Admitting: Internal Medicine

## 2020-05-19 ENCOUNTER — Encounter: Payer: Self-pay | Admitting: Internal Medicine

## 2020-05-19 ENCOUNTER — Other Ambulatory Visit: Payer: Self-pay

## 2020-05-19 ENCOUNTER — Ambulatory Visit (INDEPENDENT_AMBULATORY_CARE_PROVIDER_SITE_OTHER): Payer: Medicare Other | Admitting: Internal Medicine

## 2020-05-19 DIAGNOSIS — I10 Essential (primary) hypertension: Secondary | ICD-10-CM | POA: Diagnosis not present

## 2020-05-19 DIAGNOSIS — R42 Dizziness and giddiness: Secondary | ICD-10-CM | POA: Insufficient documentation

## 2020-05-19 DIAGNOSIS — N138 Other obstructive and reflux uropathy: Secondary | ICD-10-CM

## 2020-05-19 DIAGNOSIS — G959 Disease of spinal cord, unspecified: Secondary | ICD-10-CM

## 2020-05-19 DIAGNOSIS — N401 Enlarged prostate with lower urinary tract symptoms: Secondary | ICD-10-CM

## 2020-05-19 MED ORDER — LOSARTAN POTASSIUM-HCTZ 50-12.5 MG PO TABS
1.0000 | ORAL_TABLET | Freq: Every day | ORAL | 3 refills | Status: DC
Start: 2020-05-19 — End: 2020-06-09

## 2020-05-19 NOTE — Assessment & Plan Note (Signed)
I suspect this is orthostatic dizziness due to overtreatment of his BP Will cut back losartan/HCTZ to 50/12.5 No further testing unless persistent problems

## 2020-05-19 NOTE — Assessment & Plan Note (Signed)
BP Readings from Last 3 Encounters:  05/19/20 112/66  11/28/19 (!) 107/41  11/20/19 (!) 132/74   BP low here and okay at home Will try the lower losartan dose

## 2020-05-19 NOTE — Assessment & Plan Note (Signed)
Discussed that tamsulosin could cause dizziness as well Doing well on this though No change for now

## 2020-05-19 NOTE — Assessment & Plan Note (Signed)
Symptoms better in arms since surgery--but ongoing sensory changes in feet This may have altered the vascular evaluation (but no claudication, so no further action about this)

## 2020-05-19 NOTE — Progress Notes (Signed)
Subjective:    Patient ID: Evan Giles, male    DOB: 04-12-1941, 80 y.o.   MRN: [id number]  HPI Here due to dizziness This visit occurred during the SARS-CoV-2 public health emergency.  Safety protocols were in place, including screening questions prior to the visit, additional usage of staff PPE, and extensive cleaning of exam room while observing appropriate contact time as indicated for disinfecting solutions.   Had cervical spine surgery Arms are better but not really his legs  Now having more dizziness If he gets up from a machine when working out--stands up and gets dizzy Not a problem when on the bike No problems when actually doing the exercise Some dizzy feelings after first standing---and occasional balance off feeling after walking for a while No vertigo Symptoms will resolve within a minute in general  No chest pain No SOB No palpitations No problems with stamina---building back up again over time   Occasionally checks BP at home 120-130/70  No claudication Does have numbness in feet  Current Outpatient Medications on File Prior to Visit  Medication Sig Dispense Refill  . aspirin EC 81 MG tablet Take 81 mg by mouth daily.     Marland Kitchen atorvastatin (LIPITOR) 20 MG tablet TAKE 1 TABLET BY MOUTH DAILY (Patient taking differently: Take 20 mg by mouth daily.) 90 tablet 3  . Calcium Citrate-Vitamin D (CALCIUM + D PO) Take 1 tablet by mouth daily.     Marland Kitchen losartan-hydrochlorothiazide (HYZAAR) 100-12.5 MG tablet TAKE ONE TABLET BY MOUTH EVERY DAY (Patient taking differently: Take 1 tablet by mouth daily.) 90 tablet 3  . Magnesium 250 MG TABS Take 250 mg by mouth daily.     . Multiple Vitamins-Minerals (CENTRUM MEN PO) Take by mouth.    . tamsulosin (FLOMAX) 0.4 MG CAPS capsule Take 0.8 mg by mouth at bedtime.     . TURMERIC PO Take by mouth.     No current facility-administered medications on file prior to visit.    Allergies  Allergen Reactions  . Hydrocodone Itching     Past Medical History:  Diagnosis Date  . Diverticulitis   . Hard of hearing   . Hyperlipidemia   . Hypertension   . Lumbar stenosis with neurogenic claudication   . Osteoarthrosis of knee   . Peptic ulcer    years ago  . Wears hearing aid in both ears     Past Surgical History:  Procedure Laterality Date  . ANTERIOR CERVICAL DECOMP/DISCECTOMY FUSION N/A 11/27/2019   Procedure: Cervical Four-Five Cervical Five-Six Anterior cervical decompression/discectomy/fusion with interbody prosthesis, plate, screws, explore fusion, removal of hardware;  Surgeon: Newman Pies, MD;  Location: [facility];  Service: Neurosurgery;  Laterality: N/A;  Cervical Four-Five Cervical Five-Six Anterior cervical decompression/discectomy/fusion with interbody prosthesis, plate, screws, ex  . CATARACT EXTRACTION W/ INTRAOCULAR LENS IMPLANT Left 11/2010  . CATARACT EXTRACTION W/PHACO Right 10/08/2019   Procedure: CATARACT EXTRACTION PHACO AND INTRAOCULAR LENS PLACEMENT ([place]) RIGHT;  Surgeon: Birder Robson, MD;  Location: [facility];  Service: Ophthalmology;  Laterality: Right;  7.85 0:46.3  . CHONDROPLASTY Right 04/10/2019   Procedure: CHONDROPLASTY;  Surgeon: Dereck Leep, MD;  Location: [facility];  Service: Orthopedics;  Laterality: Right;  . colonoscopy  2010  . dental implant  08/2015  . INGUINAL HERNIA REPAIR Left 05/23/2018   Procedure: LEFT INGUINAL HERNIA REPAIR WITH MESH ERAS PATHWAY;  Surgeon: Jovita Kussmaul, MD;  Location: [facility];  Service: General;  Laterality: Left;  . JOINT REPLACEMENT  Left 06/2011   Left knee---Dr Hooten  . KNEE ARTHROSCOPY Right 09/07/2015   Procedure: ARTHROSCOPY KNEE, PARTIAL MEDIAL AND LATERAL MENISECTOMY, chondroplasty of patella femoral.;  Surgeon: Donato Heinz, MD;  Location: [facility];  Service: Orthopedics;  Laterality: Right;  . KNEE ARTHROSCOPY Right 09/25/2017   Procedure: ARTHROSCOPY KNEE;  Surgeon: Donato Heinz, MD;  Location: [facility];  Service:  Orthopedics;  Laterality: Right;  . KNEE ARTHROSCOPY WITH MEDIAL MENISECTOMY Right 09/25/2017   Procedure: KNEE ARTHROSCOPY WITH MEDIAL MENISECTOMY;  Surgeon: Donato Heinz, MD;  Location: [facility];  Service: Orthopedics;  Laterality: Right;  . KNEE ARTHROSCOPY WITH MEDIAL MENISECTOMY Right 04/10/2019   Procedure: KNEE ARTHROSCOPY WITH PARTIAL MEDIAL MENISECTOMY;  Surgeon: Donato Heinz, MD;  Location: [facility];  Service: Orthopedics;  Laterality: Right;  . LUMBAR LAMINECTOMY/DECOMPRESSION MICRODISCECTOMY N/A 06/20/2018   Procedure: Lumbar 3-4, Lumbar 4-5 Laminectomy/Foraminotomy;  Surgeon: Tressie Stalker, MD;  Location: [facility];  Service: Neurosurgery;  Laterality: N/A;  Lumbar 3-4, Lumbar 4-5 Laminectomy/Foraminotomy  . MENISCUS DEBRIDEMENT Left 05/2010   Dr Dorise Bullion  . RETINAL LASER PROCEDURE Left 07/2010   Dr Monte Fantasia  . spinal stenosis surgery  2018, 01/10/18, 06/20/18   Metal plate  . TONSILLECTOMY AND ADENOIDECTOMY  1966    Family History  Problem Relation Age of Onset  . Heart disease Father   . Stroke Father        not certain about this  . Cancer Sister        unknown  . Diabetes Maternal Grandmother   . Obesity Sister     Social History   Socioeconomic History  . Marital status: Married    Spouse name: Darl Pikes  . Number of children: 2  . Years of education: 59  . Highest education level: Not on file  Occupational History  . Occupation: Theatre stage manager    Comment: mostly Medicare advantage  Tobacco Use  . Smoking status: Never Smoker  . Smokeless tobacco: Never Used  Vaping Use  . Vaping Use: Never used  Substance and Sexual Activity  . Alcohol use: No    Alcohol/week: 0.0 standard drinks  . Drug use: No  . Sexual activity: Not on file  Other Topics Concern  . Not on file  Social History Narrative   I son and 1 daughter   Has living will   Wife is health care POA --- alternate would be son   Would accept resuscitation---but no prolonged  ventilation   Probably would accept feeding tube--at least for a limited time   Lives with wife   Social Determinants of Health   Financial Resource Strain: Not on file  Food Insecurity: Not on file  Transportation Needs: Not on file  Physical Activity: Not on file  Stress: Not on file  Social Connections: Not on file  Intimate Partner Violence: Not on file   Review of Systems Eating okay Not sleeping great---up twice a night (not awakening to void--just up) Some daytime tiredness--occasional nap Still working full time--insurance sales     Objective:   Physical Exam Constitutional:      Appearance: Normal appearance.  Cardiovascular:     Rate and Rhythm: Normal rate and regular rhythm.     Heart sounds: No murmur heard. No gallop.      Comments: Faint but palpable pulses in feet---which are somewhat cool Pulmonary:     Effort: Pulmonary effort is normal.     Breath sounds: Normal breath sounds. No wheezing or rales.  Musculoskeletal:     Cervical back: Neck supple.     Right lower leg: No edema.     Left lower leg: No edema.  Lymphadenopathy:     Cervical: No cervical adenopathy.  Neurological:     General: No focal deficit present.     Mental Status: He is alert.            Assessment & Plan:

## 2020-05-29 DIAGNOSIS — H43811 Vitreous degeneration, right eye: Secondary | ICD-10-CM | POA: Diagnosis not present

## 2020-05-29 LAB — HM DIABETES EYE EXAM

## 2020-06-04 ENCOUNTER — Telehealth: Payer: Self-pay

## 2020-06-04 NOTE — Telephone Encounter (Signed)
Patient made a appointment via [facility] for 06/09/2020 at 0815 with Dr. Silvio Pate for "Blood Pressure and Dizziness." This RN called to triage patient. Patient stated that he saw Dr. Silvio Pate on 05/19/2020 and just wanted to make a follow-up appointment. Patient stated that he is still experiencing low blood pressure and dizziness and feels that the changes Dr. Silvio Pate made to his medication were not helping. Patient denied blurred vision or syncopal episodes. Patient stated that he has been keeping a log. UC and ED precautions given to patient. Patient verbalized understanding.

## 2020-06-04 NOTE — Telephone Encounter (Signed)
It sounds like it will be fine to wait till 2/15 for this appt

## 2020-06-09 ENCOUNTER — Encounter: Payer: Self-pay | Admitting: Internal Medicine

## 2020-06-09 ENCOUNTER — Other Ambulatory Visit: Payer: Self-pay

## 2020-06-09 ENCOUNTER — Ambulatory Visit (INDEPENDENT_AMBULATORY_CARE_PROVIDER_SITE_OTHER): Payer: Medicare Other | Admitting: Internal Medicine

## 2020-06-09 DIAGNOSIS — R42 Dizziness and giddiness: Secondary | ICD-10-CM

## 2020-06-09 MED ORDER — LOSARTAN POTASSIUM 50 MG PO TABS
50.0000 mg | ORAL_TABLET | Freq: Every day | ORAL | 3 refills | Status: DC
Start: 2020-06-09 — End: 2021-03-23

## 2020-06-09 NOTE — Progress Notes (Signed)
Subjective:    Patient ID: Evan Giles, male    DOB: 1940/10/11, 80 y.o.   MRN: [id number]  HPI Here for follow up of his dizziness This visit occurred during the SARS-CoV-2 public health emergency.  Safety protocols were in place, including screening questions prior to the visit, additional usage of staff PPE, and extensive cleaning of exam room while observing appropriate contact time as indicated for disinfecting solutions.   Is still having some dizziness--but is better Gets "wore out" if he walks for 30-40 feet Does okay on the bike though  Feels a bit unstable when standing ---- but not sitting or in bed Home BP mostly in 130's/70's with pulse in 60's Takes the med in the morning  Current Outpatient Medications on File Prior to Visit  Medication Sig Dispense Refill  . aspirin EC 81 MG tablet Take 81 mg by mouth daily.     Marland Kitchen atorvastatin (LIPITOR) 20 MG tablet TAKE 1 TABLET BY MOUTH DAILY (Patient taking differently: Take 20 mg by mouth daily.) 90 tablet 3  . Calcium Citrate-Vitamin D (CALCIUM + D PO) Take 1 tablet by mouth daily.     Marland Kitchen losartan-hydrochlorothiazide (HYZAAR) 50-12.5 MG tablet Take 1 tablet by mouth daily. 90 tablet 3  . Magnesium 250 MG TABS Take 250 mg by mouth daily.     . Multiple Vitamins-Minerals (CENTRUM MEN PO) Take by mouth.    . tamsulosin (FLOMAX) 0.4 MG CAPS capsule Take 0.8 mg by mouth at bedtime.     . TURMERIC PO Take by mouth.     No current facility-administered medications on file prior to visit.    Allergies  Allergen Reactions  . Hydrocodone Itching    Past Medical History:  Diagnosis Date  . Diverticulitis   . Hard of hearing   . Hyperlipidemia   . Hypertension   . Lumbar stenosis with neurogenic claudication   . Osteoarthrosis of knee   . Peptic ulcer    years ago  . Wears hearing aid in both ears     Past Surgical History:  Procedure Laterality Date  . ANTERIOR CERVICAL DECOMP/DISCECTOMY FUSION N/A 11/27/2019    Procedure: Cervical Four-Five Cervical Five-Six Anterior cervical decompression/discectomy/fusion with interbody prosthesis, plate, screws, explore fusion, removal of hardware;  Surgeon: Newman Pies, MD;  Location: [facility];  Service: Neurosurgery;  Laterality: N/A;  Cervical Four-Five Cervical Five-Six Anterior cervical decompression/discectomy/fusion with interbody prosthesis, plate, screws, ex  . CATARACT EXTRACTION W/ INTRAOCULAR LENS IMPLANT Left 11/2010  . CATARACT EXTRACTION W/PHACO Right 10/08/2019   Procedure: CATARACT EXTRACTION PHACO AND INTRAOCULAR LENS PLACEMENT ([place]) RIGHT;  Surgeon: Birder Robson, MD;  Location: [place];  Service: Ophthalmology;  Laterality: Right;  7.85 0:46.3  . CHONDROPLASTY Right 04/10/2019   Procedure: CHONDROPLASTY;  Surgeon: Dereck Leep, MD;  Location: [facility];  Service: Orthopedics;  Laterality: Right;  . colonoscopy  2010  . dental implant  08/2015  . INGUINAL HERNIA REPAIR Left 05/23/2018   Procedure: LEFT INGUINAL HERNIA REPAIR WITH MESH ERAS PATHWAY;  Surgeon: Jovita Kussmaul, MD;  Location: [facility];  Service: General;  Laterality: Left;  . JOINT REPLACEMENT Left 06/2011   Left knee---Dr Hooten  . KNEE ARTHROSCOPY Right 09/07/2015   Procedure: ARTHROSCOPY KNEE, PARTIAL MEDIAL AND LATERAL MENISECTOMY, chondroplasty of patella femoral.;  Surgeon: Dereck Leep, MD;  Location: [facility];  Service: Orthopedics;  Laterality: Right;  . KNEE ARTHROSCOPY Right 09/25/2017   Procedure: ARTHROSCOPY KNEE;  Surgeon: Dereck Leep,  MD;  Location: [facility];  Service: Orthopedics;  Laterality: Right;  . KNEE ARTHROSCOPY WITH MEDIAL MENISECTOMY Right 09/25/2017   Procedure: KNEE ARTHROSCOPY WITH MEDIAL MENISECTOMY;  Surgeon: Dereck Leep, MD;  Location: [facility];  Service: Orthopedics;  Laterality: Right;  . KNEE ARTHROSCOPY WITH MEDIAL MENISECTOMY Right 04/10/2019   Procedure: KNEE ARTHROSCOPY WITH PARTIAL MEDIAL MENISECTOMY;  Surgeon: Dereck Leep, MD;  Location: [facility];  Service: Orthopedics;  Laterality: Right;  . LUMBAR LAMINECTOMY/DECOMPRESSION MICRODISCECTOMY N/A 06/20/2018   Procedure: Lumbar 3-4, Lumbar 4-5 Laminectomy/Foraminotomy;  Surgeon: Newman Pies, MD;  Location: [place];  Service: Neurosurgery;  Laterality: N/A;  Lumbar 3-4, Lumbar 4-5 Laminectomy/Foraminotomy  . MENISCUS DEBRIDEMENT Left 05/2010   Dr Roberts Gaudy  . RETINAL LASER PROCEDURE Left 07/2010   Dr Darrick Grinder  . spinal stenosis surgery  2018, 01/10/18, 06/20/18   Metal plate  . TONSILLECTOMY AND ADENOIDECTOMY  1966    Family History  Problem Relation Age of Onset  . Heart disease Father   . Stroke Father        not certain about this  . Cancer Sister        unknown  . Diabetes Maternal Grandmother   . Obesity Sister     Social History   Socioeconomic History  . Marital status: Married    Spouse name: [name]  . Number of children: 2  . Years of education: 57  . Highest education level: Not on file  Occupational History  . Occupation: Engineer, drilling    Comment: mostly Medicare advantage  Tobacco Use  . Smoking status: Never Smoker  . Smokeless tobacco: Never Used  Vaping Use  . Vaping Use: Never used  Substance and Sexual Activity  . Alcohol use: No    Alcohol/week: 0.0 standard drinks  . Drug use: No  . Sexual activity: Not on file  Other Topics Concern  . Not on file  Social History Narrative   I son and 1 daughter   Has living will   Wife is health care POA --- alternate would be son   Would accept resuscitation---but no prolonged ventilation   Probably would accept feeding tube--at least for a limited time   Lives with wife   Social Determinants of Health   Financial Resource Strain: Not on file  Food Insecurity: Not on file  Transportation Needs: Not on file  Physical Activity: Not on file  Stress: Not on file  Social Connections: Not on file  Intimate Partner Violence: Not on file   Review of Systems   Not sleeping well--awakens a couple of times (eventually gets back to sleep_ Does nap some Appetite is okay--has been very hungry lately Ongoing leg symptoms residual from the surgery (strength okay but endurance still not back)     Objective:   Physical Exam Constitutional:      Appearance: Normal appearance.  Cardiovascular:     Rate and Rhythm: Normal rate and regular rhythm.     Heart sounds: No murmur heard. No gallop.   Pulmonary:     Effort: Pulmonary effort is normal.     Breath sounds: Normal breath sounds. No wheezing or rales.  Musculoskeletal:     Cervical back: Neck supple.     Right lower leg: No edema.     Left lower leg: No edema.  Lymphadenopathy:     Cervical: No cervical adenopathy.  Neurological:     Mental Status: He is alert.  Assessment & Plan:

## 2020-06-09 NOTE — Assessment & Plan Note (Signed)
Persists and seems to be orthostatic Not sure if autonomic changes since his cervical spine surgery Will stop the HCTZ and continue losartan alone He will monitor his BP at home---may need to accept systolic of 726 when sitting to avoid the dizziness when standing

## 2020-06-11 DIAGNOSIS — G8929 Other chronic pain: Secondary | ICD-10-CM | POA: Diagnosis not present

## 2020-06-11 DIAGNOSIS — M25561 Pain in right knee: Secondary | ICD-10-CM | POA: Diagnosis not present

## 2020-07-28 ENCOUNTER — Other Ambulatory Visit: Payer: Self-pay | Admitting: Neurosurgery

## 2020-07-28 DIAGNOSIS — M4316 Spondylolisthesis, lumbar region: Secondary | ICD-10-CM | POA: Diagnosis not present

## 2020-07-28 DIAGNOSIS — M4712 Other spondylosis with myelopathy, cervical region: Secondary | ICD-10-CM | POA: Diagnosis not present

## 2020-08-07 ENCOUNTER — Ambulatory Visit
Admission: RE | Admit: 2020-08-07 | Discharge: 2020-08-07 | Disposition: A | Discharge status: Home / Self Care | Payer: Medicare Other | Source: Ambulatory Visit | Attending: Neurosurgery | Admitting: Neurosurgery

## 2020-08-07 ENCOUNTER — Other Ambulatory Visit: Payer: Self-pay

## 2020-08-07 DIAGNOSIS — M4319 Spondylolisthesis, multiple sites in spine: Secondary | ICD-10-CM | POA: Diagnosis not present

## 2020-08-07 DIAGNOSIS — R202 Paresthesia of skin: Secondary | ICD-10-CM | POA: Diagnosis not present

## 2020-08-07 DIAGNOSIS — M4316 Spondylolisthesis, lumbar region: Secondary | ICD-10-CM | POA: Diagnosis not present

## 2020-08-07 DIAGNOSIS — M48061 Spinal stenosis, lumbar region without neurogenic claudication: Secondary | ICD-10-CM | POA: Diagnosis not present

## 2020-08-07 DIAGNOSIS — M5126 Other intervertebral disc displacement, lumbar region: Secondary | ICD-10-CM | POA: Diagnosis not present

## 2020-08-07 MED ORDER — GADOBUTROL 1 MMOL/ML IV SOLN
10.0000 mL | Freq: Once | INTRAVENOUS | Status: AC | PRN
  Administered 2020-08-07: 10 mL via INTRAVENOUS

## 2020-08-25 ENCOUNTER — Encounter: Payer: Self-pay | Admitting: Internal Medicine

## 2020-08-25 ENCOUNTER — Other Ambulatory Visit: Payer: Self-pay

## 2020-08-25 ENCOUNTER — Ambulatory Visit (INDEPENDENT_AMBULATORY_CARE_PROVIDER_SITE_OTHER): Payer: Medicare Other | Admitting: Internal Medicine

## 2020-08-25 VITALS — BP 126/82 | HR 61 | Temp 98.1°F | Ht 70.5 in | Wt 244.0 lb

## 2020-08-25 DIAGNOSIS — N138 Other obstructive and reflux uropathy: Secondary | ICD-10-CM

## 2020-08-25 DIAGNOSIS — M4722 Other spondylosis with radiculopathy, cervical region: Secondary | ICD-10-CM

## 2020-08-25 DIAGNOSIS — I10 Essential (primary) hypertension: Secondary | ICD-10-CM

## 2020-08-25 DIAGNOSIS — M4712 Other spondylosis with myelopathy, cervical region: Secondary | ICD-10-CM

## 2020-08-25 DIAGNOSIS — G629 Polyneuropathy, unspecified: Secondary | ICD-10-CM

## 2020-08-25 DIAGNOSIS — Z1211 Encounter for screening for malignant neoplasm of colon: Secondary | ICD-10-CM

## 2020-08-25 DIAGNOSIS — M48061 Spinal stenosis, lumbar region without neurogenic claudication: Secondary | ICD-10-CM | POA: Diagnosis not present

## 2020-08-25 DIAGNOSIS — N401 Enlarged prostate with lower urinary tract symptoms: Secondary | ICD-10-CM

## 2020-08-25 DIAGNOSIS — Z0000 Encounter for general adult medical examination without abnormal findings: Secondary | ICD-10-CM | POA: Diagnosis not present

## 2020-08-25 DIAGNOSIS — R7303 Prediabetes: Secondary | ICD-10-CM | POA: Diagnosis not present

## 2020-08-25 LAB — CBC
HCT: 41 % (ref 39.0–52.0)
Hemoglobin: 13.7 g/dL (ref 13.0–17.0)
MCHC: 33.4 g/dL (ref 30.0–36.0)
MCV: 85.6 fl (ref 78.0–100.0)
Platelets: 265 10*3/uL (ref 150.0–400.0)
RBC: 4.79 Mil/uL (ref 4.22–5.81)
RDW: 13.6 % (ref 11.5–15.5)
WBC: 7.4 10*3/uL (ref 4.0–10.5)

## 2020-08-25 LAB — LIPID PANEL
Cholesterol: 192 mg/dL (ref 0–200)
HDL: 37.5 mg/dL — ABNORMAL LOW (ref >39.00)
LDL Cholesterol: 132 mg/dL — ABNORMAL HIGH (ref 0–99)
NonHDL: 154.39
Total CHOL/HDL Ratio: 5
Triglycerides: 114 mg/dL (ref 0.0–149.0)
VLDL: 22.8 mg/dL (ref 0.0–40.0)

## 2020-08-25 LAB — HEMOGLOBIN A1C: Hgb A1c MFr Bld: 6.3 % (ref 4.6–6.5)

## 2020-08-25 LAB — COMPREHENSIVE METABOLIC PANEL
ALT: 8 U/L (ref 0–53)
AST: 14 U/L (ref 0–37)
Albumin: 4.3 g/dL (ref 3.5–5.2)
Alkaline Phosphatase: 82 U/L (ref 39–117)
BUN: 18 mg/dL (ref 6–23)
CO2: 29 mEq/L (ref 19–32)
Calcium: 9.1 mg/dL (ref 8.4–10.5)
Chloride: 104 mEq/L (ref 96–112)
Creatinine, Ser: 0.75 mg/dL (ref 0.40–1.50)
GFR: 86.66 mL/min (ref >60.00)
Glucose, Bld: 103 mg/dL — ABNORMAL HIGH (ref 70–99)
Potassium: 5 mEq/L (ref 3.5–5.1)
Sodium: 140 mEq/L (ref 135–145)
Total Bilirubin: 0.6 mg/dL (ref 0.2–1.2)
Total Protein: 6.6 g/dL (ref 6.0–8.3)

## 2020-08-25 LAB — T4, FREE: Free T4: 0.92 ng/dL (ref 0.60–1.60)

## 2020-08-25 LAB — VITAMIN B12: Vitamin B-12: 501 pg/mL (ref 211–911)

## 2020-08-25 NOTE — Assessment & Plan Note (Signed)
Okay on tamsulosin °

## 2020-08-25 NOTE — Assessment & Plan Note (Signed)
I have personally reviewed the Medicare Annual Wellness questionnaire and have noted 1. The patient's medical and social history 2. Their use of alcohol, tobacco or illicit drugs 3. Their current medications and supplements 4. The patient's functional ability including ADL's, fall risks, home safety risks and hearing or visual             impairment. 5. Diet and physical activities 6. Evidence for depression or mood disorders  The patients weight, height, BMI and visual acuity have been recorded in the chart I have made referrals, counseling and provided education to the patient based review of the above and I have provided the pt with a written personalized care plan for preventive services.  I have provided you with a copy of your personalized plan for preventive services. Please take the time to review along with your updated medication list.  Will do FIT yearly till 82 No more PSA Discussed exercise Reluctant to take COVID booster due to persistent arm pain--discussed Flu vaccine in the fall

## 2020-08-25 NOTE — Assessment & Plan Note (Signed)
Will consider metformin if worse

## 2020-08-25 NOTE — Assessment & Plan Note (Signed)
Better since the surgery but still some symptoms

## 2020-08-25 NOTE — Patient Instructions (Signed)

## 2020-08-25 NOTE — Progress Notes (Signed)
Subjective:    Patient ID: Evan Giles, male    DOB: May 31, 1940, 80 y.o.   MRN: [id number]  HPI Here for Medicare wellness visit and follow up of chronic health conditions This visit occurred during the SARS-CoV-2 public health emergency.  Safety protocols were in place, including screening questions prior to the visit, additional usage of staff PPE, and extensive cleaning of exam room while observing appropriate contact time as indicated for disinfecting solutions.   Reviewed form and advanced directives Reviewed other doctors No alcohol or tobacco Exercises regualrly Vision is fine.  Hearing aides--they are helpful No falls No depression or anhedonia Works full time--insurance business Independent with instrumental ADLs No memory problems  Still having dizziness---hard to "walk a straight line" Mostly balance issue--not really ready to pass out Legs are slow to react---trips easy Uses the cane most of the time Feet still cold and numb--and feel heavy Coldness in arms and legs is very slowly improving  Checks BP at times--- usually 130/s/70's No chest pain or SOB No palpitations No edema  Doesn't check sugars Not really watching eating much---weight up somewhat  Voids slow---but then flow okay Nocturia x 1 usually  Some pain in left arm--relates to first COVID vaccine. 1 ibuprofen will help at bedtime  Current Outpatient Medications on File Prior to Visit  Medication Sig Dispense Refill  . aspirin EC 81 MG tablet Take 81 mg by mouth daily.     Marland Kitchen atorvastatin (LIPITOR) 20 MG tablet TAKE 1 TABLET BY MOUTH DAILY (Patient taking differently: Take 20 mg by mouth daily.) 90 tablet 3  . Calcium Citrate-Vitamin D (CALCIUM + D PO) Take 1 tablet by mouth daily.     Marland Kitchen losartan (COZAAR) 50 MG tablet Take 1 tablet (50 mg total) by mouth daily. 90 tablet 3  . Magnesium 250 MG TABS Take 250 mg by mouth daily.     . Multiple Vitamins-Minerals (CENTRUM MEN PO) Take by mouth.     . tamsulosin (FLOMAX) 0.4 MG CAPS capsule Take 0.8 mg by mouth at bedtime.     . TURMERIC PO Take by mouth.     No current facility-administered medications on file prior to visit.    Allergies  Allergen Reactions  . Hydrocodone Itching    Past Medical History:  Diagnosis Date  . Diverticulitis   . Hard of hearing   . Hyperlipidemia   . Hypertension   . Lumbar stenosis with neurogenic claudication   . Osteoarthrosis of knee   . Peptic ulcer    years ago  . Wears hearing aid in both ears     Past Surgical History:  Procedure Laterality Date  . ANTERIOR CERVICAL DECOMP/DISCECTOMY FUSION N/A 11/27/2019   Procedure: Cervical Four-Five Cervical Five-Six Anterior cervical decompression/discectomy/fusion with interbody prosthesis, plate, screws, explore fusion, removal of hardware;  Surgeon: Newman Pies, MD;  Location: [place];  Service: Neurosurgery;  Laterality: N/A;  Cervical Four-Five Cervical Five-Six Anterior cervical decompression/discectomy/fusion with interbody prosthesis, plate, screws, ex  . CATARACT EXTRACTION W/ INTRAOCULAR LENS IMPLANT Left 11/2010  . CATARACT EXTRACTION W/PHACO Right 10/08/2019   Procedure: CATARACT EXTRACTION PHACO AND INTRAOCULAR LENS PLACEMENT ([place]) RIGHT;  Surgeon: Birder Robson, MD;  Location: [facility];  Service: Ophthalmology;  Laterality: Right;  7.85 0:46.3  . CHONDROPLASTY Right 04/10/2019   Procedure: CHONDROPLASTY;  Surgeon: Dereck Leep, MD;  Location: [facility];  Service: Orthopedics;  Laterality: Right;  . colonoscopy  2010  . dental implant  08/2015  .  INGUINAL HERNIA REPAIR Left 05/23/2018   Procedure: LEFT INGUINAL HERNIA REPAIR WITH MESH ERAS PATHWAY;  Surgeon: Jovita Kussmaul, MD;  Location: [facility];  Service: General;  Laterality: Left;  . JOINT REPLACEMENT Left 06/2011   Left knee---Dr Hooten  . KNEE ARTHROSCOPY Right 09/07/2015   Procedure: ARTHROSCOPY KNEE, PARTIAL MEDIAL AND LATERAL MENISECTOMY, chondroplasty  of patella femoral.;  Surgeon: Dereck Leep, MD;  Location: [facility];  Service: Orthopedics;  Laterality: Right;  . KNEE ARTHROSCOPY Right 09/25/2017   Procedure: ARTHROSCOPY KNEE;  Surgeon: Dereck Leep, MD;  Location: [facility];  Service: Orthopedics;  Laterality: Right;  . KNEE ARTHROSCOPY WITH MEDIAL MENISECTOMY Right 09/25/2017   Procedure: KNEE ARTHROSCOPY WITH MEDIAL MENISECTOMY;  Surgeon: Dereck Leep, MD;  Location: [facility];  Service: Orthopedics;  Laterality: Right;  . KNEE ARTHROSCOPY WITH MEDIAL MENISECTOMY Right 04/10/2019   Procedure: KNEE ARTHROSCOPY WITH PARTIAL MEDIAL MENISECTOMY;  Surgeon: Dereck Leep, MD;  Location: [facility];  Service: Orthopedics;  Laterality: Right;  . LUMBAR LAMINECTOMY/DECOMPRESSION MICRODISCECTOMY N/A 06/20/2018   Procedure: Lumbar 3-4, Lumbar 4-5 Laminectomy/Foraminotomy;  Surgeon: Newman Pies, MD;  Location: [place];  Service: Neurosurgery;  Laterality: N/A;  Lumbar 3-4, Lumbar 4-5 Laminectomy/Foraminotomy  . MENISCUS DEBRIDEMENT Left 05/2010   Dr Roberts Gaudy  . RETINAL LASER PROCEDURE Left 07/2010   Dr Darrick Grinder  . spinal stenosis surgery  2018, 01/10/18, 06/20/18   Metal plate  . TONSILLECTOMY AND ADENOIDECTOMY  1966    Family History  Problem Relation Age of Onset  . Heart disease Father   . Stroke Father        not certain about this  . Cancer Sister        unknown  . Diabetes Maternal Grandmother   . Obesity Sister     Social History   Socioeconomic History  . Marital status: Married    Spouse name: [name]  . Number of children: 2  . Years of education: 91  . Highest education level: Not on file  Occupational History  . Occupation: Engineer, drilling    Comment: mostly Medicare advantage  Tobacco Use  . Smoking status: Never Smoker  . Smokeless tobacco: Never Used  Vaping Use  . Vaping Use: Never used  Substance and Sexual Activity  . Alcohol use: No    Alcohol/week: 0.0 standard drinks  . Drug use: No  .  Sexual activity: Not on file  Other Topics Concern  . Not on file  Social History Narrative   I son and 1 daughter   Has living will   Wife is health care POA --- alternate would be son   Would accept resuscitation---but no prolonged ventilation   Probably would accept feeding tube--at least for a limited time   Lives with wife   Social Determinants of Health   Financial Resource Strain: Not on file  Food Insecurity: Not on file  Transportation Needs: Not on file  Physical Activity: Not on file  Stress: Not on file  Social Connections: Not on file  Intimate Partner Violence: Not on file   Review of Systems Weight up slightly  Sleeps fairly well Wears seat belt Teeth are fine--regular with dentist No suspicious skin lesions No heartburn or dysphagia Bowels move fine--no blood    Objective:   Physical Exam Constitutional:      Appearance: Normal appearance.  HENT:     Mouth/Throat:     Comments: No lesions Eyes:     Conjunctiva/sclera: Conjunctivae normal.  Pupils: Pupils are equal, round, and reactive to light.  Cardiovascular:     Rate and Rhythm: Normal rate and regular rhythm.     Pulses: Normal pulses.     Heart sounds: No murmur heard. No gallop.   Pulmonary:     Effort: Pulmonary effort is normal.     Breath sounds: Normal breath sounds. No wheezing or rales.  Abdominal:     Palpations: Abdomen is soft.     Tenderness: There is no abdominal tenderness.  Musculoskeletal:     Right lower leg: No edema.     Left lower leg: No edema.  Lymphadenopathy:     Cervical: No cervical adenopathy.  Skin:    General: Skin is warm.     Findings: No rash.  Neurological:     Mental Status: He is alert and oriented to person, place, and time.     Comments: President---"Biden, Trump, Obama" [id number] Recall 3/3  Psychiatric:        Mood and Affect: Mood normal.        Behavior: Behavior normal.            Assessment & Plan:

## 2020-08-25 NOTE — Assessment & Plan Note (Signed)
Seems to be from the myelopathy----his "dizziness" is actually balance problems from this Discussed cane Will check labs

## 2020-08-25 NOTE — Assessment & Plan Note (Signed)
Mild persistent symptoms since the surgery

## 2020-08-25 NOTE — Assessment & Plan Note (Signed)
BP Readings from Last 3 Encounters:  08/25/20 126/82  06/09/20 118/76  05/19/20 112/66   Doing fine on just the losartan Due for labs

## 2020-08-27 LAB — PROTEIN ELECTROPHORESIS, SERUM, WITH REFLEX
Albumin ELP: 4.1 g/dL (ref 3.8–4.8)
Alpha 1: 0.2 g/dL (ref 0.2–0.3)
Alpha 2: 0.6 g/dL (ref 0.5–0.9)
Beta 2: 0.3 g/dL (ref 0.2–0.5)
Beta Globulin: 0.4 g/dL (ref 0.4–0.6)
Gamma Globulin: 0.8 g/dL (ref 0.8–1.7)
Total Protein: 6.4 g/dL (ref 6.1–8.1)

## 2020-08-28 ENCOUNTER — Other Ambulatory Visit (INDEPENDENT_AMBULATORY_CARE_PROVIDER_SITE_OTHER): Payer: Medicare Other

## 2020-08-28 DIAGNOSIS — Z1211 Encounter for screening for malignant neoplasm of colon: Secondary | ICD-10-CM

## 2020-08-28 LAB — FECAL OCCULT BLOOD, IMMUNOCHEMICAL: Fecal Occult Bld: NEGATIVE

## 2020-09-22 ENCOUNTER — Other Ambulatory Visit: Payer: Self-pay | Admitting: Internal Medicine

## 2020-10-05 DIAGNOSIS — H903 Sensorineural hearing loss, bilateral: Secondary | ICD-10-CM | POA: Diagnosis not present

## 2020-10-28 DIAGNOSIS — R3911 Hesitancy of micturition: Secondary | ICD-10-CM | POA: Diagnosis not present

## 2020-10-28 DIAGNOSIS — R3914 Feeling of incomplete bladder emptying: Secondary | ICD-10-CM | POA: Diagnosis not present

## 2020-11-25 DIAGNOSIS — M25512 Pain in left shoulder: Secondary | ICD-10-CM | POA: Diagnosis not present

## 2020-11-27 DIAGNOSIS — M25512 Pain in left shoulder: Secondary | ICD-10-CM | POA: Diagnosis not present

## 2020-11-30 DIAGNOSIS — M75102 Unspecified rotator cuff tear or rupture of left shoulder, not specified as traumatic: Secondary | ICD-10-CM | POA: Diagnosis not present

## 2020-11-30 DIAGNOSIS — M25512 Pain in left shoulder: Secondary | ICD-10-CM | POA: Diagnosis not present

## 2020-12-02 DIAGNOSIS — M25512 Pain in left shoulder: Secondary | ICD-10-CM | POA: Diagnosis not present

## 2020-12-07 ENCOUNTER — Other Ambulatory Visit: Payer: Self-pay | Admitting: Student

## 2020-12-07 DIAGNOSIS — M75102 Unspecified rotator cuff tear or rupture of left shoulder, not specified as traumatic: Secondary | ICD-10-CM

## 2020-12-07 DIAGNOSIS — M25512 Pain in left shoulder: Secondary | ICD-10-CM | POA: Diagnosis not present

## 2020-12-09 DIAGNOSIS — M25512 Pain in left shoulder: Secondary | ICD-10-CM | POA: Diagnosis not present

## 2020-12-12 ENCOUNTER — Ambulatory Visit
Admission: RE | Admit: 2020-12-12 | Discharge: 2020-12-12 | Disposition: A | Discharge status: Home / Self Care | Payer: Medicare Other | Source: Ambulatory Visit | Attending: Student | Admitting: Student

## 2020-12-12 DIAGNOSIS — S43432A Superior glenoid labrum lesion of left shoulder, initial encounter: Secondary | ICD-10-CM | POA: Diagnosis not present

## 2020-12-12 DIAGNOSIS — M75102 Unspecified rotator cuff tear or rupture of left shoulder, not specified as traumatic: Secondary | ICD-10-CM | POA: Insufficient documentation

## 2020-12-18 DIAGNOSIS — M75122 Complete rotator cuff tear or rupture of left shoulder, not specified as traumatic: Secondary | ICD-10-CM | POA: Diagnosis not present

## 2020-12-18 DIAGNOSIS — M19012 Primary osteoarthritis, left shoulder: Secondary | ICD-10-CM | POA: Diagnosis not present

## 2021-03-16 ENCOUNTER — Other Ambulatory Visit: Payer: Self-pay | Admitting: Surgery

## 2021-03-26 ENCOUNTER — Other Ambulatory Visit
Admission: RE | Admit: 2021-03-26 | Discharge: 2021-03-26 | Disposition: A | Discharge status: Home / Self Care | Payer: Medicare Other | Source: Ambulatory Visit | Attending: Surgery | Admitting: Surgery

## 2021-03-26 ENCOUNTER — Other Ambulatory Visit: Payer: Self-pay

## 2021-03-26 DIAGNOSIS — I10 Essential (primary) hypertension: Secondary | ICD-10-CM

## 2021-03-26 NOTE — Patient Instructions (Addendum)
Your procedure is scheduled on: 04/01/21 - Thursday Report to the Registration Desk on the 1st floor of the [facility]. To find out your arrival time, please call [phone] between 1PM - 3PM on: 03/31/21 - Wednesday  Report to [facility] for Labs/EKG on 03/29/21 at 1:30 pm.  REMEMBER: Instructions that are not followed completely may result in serious medical risk, up to and including death; or upon the discretion of your surgeon and anesthesiologist your surgery may need to be rescheduled.  Do not eat food after midnight the night before surgery.  No gum chewing, lozengers or hard candies.  You may however, drink CLEAR liquids up to 2 hours before you are scheduled to arrive for your surgery. Do not drink anything within 2 hours of your scheduled arrival time.  Clear liquids include: - water  - apple juice without pulp - gatorade (not RED, PURPLE, OR BLUE) - black coffee or tea (Do NOT add milk or creamers to the coffee or tea) Do NOT drink anything that is not on this list.  Your doctor has ordered you an Ensure Pre-surgery Drink- Drink 2 hours before your arrival time .  TAKE THESE MEDICATIONS THE MORNING OF SURGERY WITH A SIP OF WATER: NONE  One week prior to surgery: Stop Anti-inflammatories (NSAIDS) such as Advil, Aleve, Ibuprofen, Motrin, Naproxen, Naprosyn and Aspirin based products such as Excedrin, Goodys Powder, BC Powder. You may continue to take your Aspirin 81 mg daily, but do not take the day of your surgery.  Stop ANY OVER THE COUNTER supplements until after surgery.  You may however, continue to take Tylenol if needed for pain up until the day of surgery.  No Alcohol for 24 hours before or after surgery.  No Smoking including e-cigarettes for 24 hours prior to surgery.  No chewable tobacco products for at least 6 hours prior to surgery.  No nicotine patches on the day of surgery.  Do not use any "recreational" drugs for at least a week prior to  your surgery.  Please be advised that the combination of cocaine and anesthesia may have negative outcomes, up to and including death. If you test positive for cocaine, your surgery will be cancelled.  On the morning of surgery brush your teeth with toothpaste and water, you may rinse your mouth with mouthwash if you wish. Do not swallow any toothpaste or mouthwash.  Use CHG Soap or wipes as directed on instruction sheet.  Do not wear jewelry, make-up, hairpins, clips or nail polish.  Do not wear lotions, powders, or perfumes.   Do not shave body from the neck down 48 hours prior to surgery just in case you cut yourself which could leave a site for infection.  Also, freshly shaved skin may become irritated if using the CHG soap.  Contact lenses, hearing aids and dentures may not be worn into surgery.  Do not bring valuables to the hospital. [facility] is not responsible for any missing/lost belongings or valuables.   Notify your doctor if there is any change in your medical condition (cold, fever, infection).  Wear comfortable clothing (specific to your surgery type) to the hospital.  After surgery, you can help prevent lung complications by doing breathing exercises.  Take deep breaths and cough every 1-2 hours. Your doctor may order a device called an Incentive Spirometer to help you take deep breaths. When coughing or sneezing, hold a pillow firmly against your incision with both hands. This is called "splinting." Doing  this helps protect your incision. It also decreases belly discomfort.  If you are being admitted to the hospital overnight, leave your suitcase in the car. After surgery it may be brought to your room.  If you are being discharged the day of surgery, you will not be allowed to drive home. You will need a responsible adult (18 years or older) to drive you home and stay with you that night.   If you are taking public transportation, you will need to have a  responsible adult (18 years or older) with you. Please confirm with your physician that it is acceptable to use public transportation.   Please call the [facility] at [phone] if you have any questions about these instructions.  Surgery Visitation Policy:  Patients undergoing a surgery or procedure may have one family member or support person with them as long as that person is not COVID-19 positive or experiencing its symptoms.  That person may remain in the waiting area during the procedure and may rotate out with other people.  Inpatient Visitation:    Visiting hours are 7 a.m. to 8 p.m. Up to two visitors ages 16+ are allowed at one time in a patient room. The visitors may rotate out with other people during the day. Visitors must check out when they leave, or other visitors will not be allowed. One designated support person may remain overnight. The visitor must pass COVID-19 screenings, use hand sanitizer when entering and exiting the patient's room and wear a mask at all times, including in the patient's room. Patients must also wear a mask when staff or their visitor are in the room. Masking is required regardless of vaccination status.

## 2021-03-29 ENCOUNTER — Other Ambulatory Visit
Admission: RE | Admit: 2021-03-29 | Discharge: 2021-03-29 | Disposition: A | Discharge status: Home / Self Care | Payer: Medicare Other | Source: Ambulatory Visit | Attending: Surgery | Admitting: Surgery

## 2021-03-29 ENCOUNTER — Other Ambulatory Visit: Payer: Self-pay

## 2021-03-29 DIAGNOSIS — Z01818 Encounter for other preprocedural examination: Secondary | ICD-10-CM | POA: Insufficient documentation

## 2021-03-29 DIAGNOSIS — Z01810 Encounter for preprocedural cardiovascular examination: Secondary | ICD-10-CM | POA: Diagnosis not present

## 2021-03-29 DIAGNOSIS — I10 Essential (primary) hypertension: Secondary | ICD-10-CM | POA: Diagnosis not present

## 2021-03-29 LAB — BASIC METABOLIC PANEL
Anion gap: 4 — ABNORMAL LOW (ref 5–15)
BUN: 15 mg/dL (ref 8–23)
CO2: 28 mmol/L (ref 22–32)
Calcium: 8.8 mg/dL — ABNORMAL LOW (ref 8.9–10.3)
Chloride: 104 mmol/L (ref 98–111)
Creatinine, Ser: 0.64 mg/dL (ref 0.61–1.24)
GFR, Estimated: >60 mL/min (ref >60)
Glucose, Bld: 94 mg/dL (ref 70–99)
Potassium: 3.8 mmol/L (ref 3.5–5.1)
Sodium: 136 mmol/L (ref 135–145)

## 2021-03-29 LAB — CBC
HCT: 41.6 % (ref 39.0–52.0)
Hemoglobin: 13.6 g/dL (ref 13.0–17.0)
MCH: 29 pg (ref 26.0–34.0)
MCHC: 32.7 g/dL (ref 30.0–36.0)
MCV: 88.7 fL (ref 80.0–100.0)
Platelets: 273 10*3/uL (ref 150–400)
RBC: 4.69 MIL/uL (ref 4.22–5.81)
RDW: 12.9 % (ref 11.5–15.5)
WBC: 7.3 10*3/uL (ref 4.0–10.5)
nRBC: 0 % (ref 0.0–0.2)

## 2021-04-01 ENCOUNTER — Encounter: Payer: Self-pay | Admitting: Surgery

## 2021-04-01 ENCOUNTER — Ambulatory Visit: Payer: Medicare Other | Admitting: Certified Registered Nurse Anesthetist

## 2021-04-01 ENCOUNTER — Other Ambulatory Visit: Payer: Self-pay

## 2021-04-01 ENCOUNTER — Encounter
Admission: RE | Disposition: A | Discharge status: Home / Self Care | Payer: Self-pay | Source: Home / Self Care | Attending: Surgery

## 2021-04-01 ENCOUNTER — Ambulatory Visit: Payer: Medicare Other

## 2021-04-01 ENCOUNTER — Ambulatory Visit
Admission: RE | Admit: 2021-04-01 | Discharge: 2021-04-01 | Disposition: A | Discharge status: Home / Self Care | Payer: Medicare Other | Attending: Surgery | Admitting: Surgery

## 2021-04-01 DIAGNOSIS — E785 Hyperlipidemia, unspecified: Secondary | ICD-10-CM | POA: Insufficient documentation

## 2021-04-01 DIAGNOSIS — Z87891 Personal history of nicotine dependence: Secondary | ICD-10-CM | POA: Insufficient documentation

## 2021-04-01 DIAGNOSIS — M75122 Complete rotator cuff tear or rupture of left shoulder, not specified as traumatic: Secondary | ICD-10-CM | POA: Insufficient documentation

## 2021-04-01 DIAGNOSIS — S46012A Strain of muscle(s) and tendon(s) of the rotator cuff of left shoulder, initial encounter: Secondary | ICD-10-CM | POA: Insufficient documentation

## 2021-04-01 DIAGNOSIS — M24112 Other articular cartilage disorders, left shoulder: Secondary | ICD-10-CM | POA: Insufficient documentation

## 2021-04-01 DIAGNOSIS — I10 Essential (primary) hypertension: Secondary | ICD-10-CM | POA: Insufficient documentation

## 2021-04-01 DIAGNOSIS — M65812 Other synovitis and tenosynovitis, left shoulder: Secondary | ICD-10-CM | POA: Insufficient documentation

## 2021-04-01 DIAGNOSIS — Z419 Encounter for procedure for purposes other than remedying health state, unspecified: Secondary | ICD-10-CM

## 2021-04-01 DIAGNOSIS — M19012 Primary osteoarthritis, left shoulder: Secondary | ICD-10-CM | POA: Diagnosis not present

## 2021-04-01 DIAGNOSIS — M25812 Other specified joint disorders, left shoulder: Secondary | ICD-10-CM | POA: Diagnosis not present

## 2021-04-01 DIAGNOSIS — S46102A Unspecified injury of muscle, fascia and tendon of long head of biceps, left arm, initial encounter: Secondary | ICD-10-CM | POA: Diagnosis not present

## 2021-04-01 DIAGNOSIS — M25512 Pain in left shoulder: Secondary | ICD-10-CM | POA: Diagnosis not present

## 2021-04-01 DIAGNOSIS — M7582 Other shoulder lesions, left shoulder: Secondary | ICD-10-CM | POA: Insufficient documentation

## 2021-04-01 DIAGNOSIS — M7542 Impingement syndrome of left shoulder: Secondary | ICD-10-CM | POA: Diagnosis not present

## 2021-04-01 DIAGNOSIS — G8918 Other acute postprocedural pain: Secondary | ICD-10-CM | POA: Diagnosis not present

## 2021-04-01 HISTORY — PX: SHOULDER ARTHROSCOPY WITH SUBACROMIAL DECOMPRESSION, ROTATOR CUFF REPAIR AND BICEP TENDON REPAIR: SHX5687

## 2021-04-01 SURGERY — SHOULDER ARTHROSCOPY WITH SUBACROMIAL DECOMPRESSION, ROTATOR CUFF REPAIR AND BICEP TENDON REPAIR
Anesthesia: General | Site: Shoulder | Laterality: Left

## 2021-04-01 MED ORDER — FENTANYL CITRATE (PF) 100 MCG/2ML IJ SOLN
INTRAMUSCULAR | Status: DC | PRN
  Administered 2021-04-01 (×2): 25 ug via INTRAVENOUS
  Administered 2021-04-01: 50 ug via INTRAVENOUS

## 2021-04-01 MED ORDER — METOCLOPRAMIDE HCL 5 MG/ML IJ SOLN: 5.0000 mg | Freq: Three times a day (TID) | INTRAMUSCULAR | Status: DC | PRN

## 2021-04-01 MED ORDER — CHLORHEXIDINE GLUCONATE 0.12 % MT SOLN: 15.0000 mL | Freq: Once | OROMUCOSAL | Status: AC

## 2021-04-01 MED ORDER — PHENYLEPHRINE HCL-NACL 20-0.9 MG/250ML-% IV SOLN
INTRAVENOUS | Status: DC | PRN
  Administered 2021-04-01: 40 ug/min via INTRAVENOUS

## 2021-04-01 MED ORDER — OXYCODONE HCL 5 MG PO TABS: 5.0000 mg | ORAL_TABLET | ORAL | Status: DC | PRN

## 2021-04-01 MED ORDER — BUPIVACAINE-EPINEPHRINE (PF) 0.5% -1:200000 IJ SOLN
INTRAMUSCULAR | Status: AC
  Filled 2021-04-01: qty 30

## 2021-04-01 MED ORDER — BUPIVACAINE HCL (PF) 0.5 % IJ SOLN
INTRAMUSCULAR | Status: AC
  Filled 2021-04-01: qty 10

## 2021-04-01 MED ORDER — LACTATED RINGERS IV SOLN
INTRAVENOUS | Status: DC | PRN
  Administered 2021-04-01: 2500 mL

## 2021-04-01 MED ORDER — FENTANYL CITRATE PF 50 MCG/ML IJ SOSY
PREFILLED_SYRINGE | INTRAMUSCULAR | Status: AC
  Administered 2021-04-01: 50 ug
  Filled 2021-04-01: qty 1

## 2021-04-01 MED ORDER — ORAL CARE MOUTH RINSE: 15.0000 mL | Freq: Once | OROMUCOSAL | Status: AC

## 2021-04-01 MED ORDER — MIDAZOLAM HCL 2 MG/2ML IJ SOLN
INTRAMUSCULAR | Status: AC
  Administered 2021-04-01: 1 mg
  Filled 2021-04-01: qty 2

## 2021-04-01 MED ORDER — BUPIVACAINE LIPOSOME 1.3 % IJ SUSP
INTRAMUSCULAR | Status: AC
  Filled 2021-04-01: qty 10

## 2021-04-01 MED ORDER — FENTANYL CITRATE (PF) 100 MCG/2ML IJ SOLN
INTRAMUSCULAR | Status: AC
  Filled 2021-04-01: qty 2

## 2021-04-01 MED ORDER — 0.9 % SODIUM CHLORIDE (POUR BTL) OPTIME
TOPICAL | Status: DC | PRN
  Administered 2021-04-01: 500 mL

## 2021-04-01 MED ORDER — EPHEDRINE SULFATE 50 MG/ML IJ SOLN
INTRAMUSCULAR | Status: DC | PRN
  Administered 2021-04-01 (×2): 10 mg via INTRAVENOUS

## 2021-04-01 MED ORDER — ROCURONIUM BROMIDE 100 MG/10ML IV SOLN
INTRAVENOUS | Status: DC | PRN
  Administered 2021-04-01: 10 mg via INTRAVENOUS
  Administered 2021-04-01: 60 mg via INTRAVENOUS

## 2021-04-01 MED ORDER — BUPIVACAINE HCL (PF) 0.5 % IJ SOLN
INTRAMUSCULAR | Status: DC | PRN
  Administered 2021-04-01: 10 mL via PERINEURAL

## 2021-04-01 MED ORDER — BUPIVACAINE LIPOSOME 1.3 % IJ SUSP
INTRAMUSCULAR | Status: DC | PRN
  Administered 2021-04-01: 10 mL via PERINEURAL

## 2021-04-01 MED ORDER — LIDOCAINE HCL (CARDIAC) PF 100 MG/5ML IV SOSY
PREFILLED_SYRINGE | INTRAVENOUS | Status: DC | PRN
  Administered 2021-04-01: 80 mg via INTRAVENOUS

## 2021-04-01 MED ORDER — FAMOTIDINE 20 MG PO TABS
ORAL_TABLET | ORAL | Status: AC
  Administered 2021-04-01: 20 mg via ORAL
  Filled 2021-04-01: qty 1

## 2021-04-01 MED ORDER — ONDANSETRON HCL 4 MG/2ML IJ SOLN
INTRAMUSCULAR | Status: DC | PRN
  Administered 2021-04-01: 4 mg via INTRAVENOUS

## 2021-04-01 MED ORDER — SUGAMMADEX SODIUM 200 MG/2ML IV SOLN
INTRAVENOUS | Status: DC | PRN
  Administered 2021-04-01: 200 mg via INTRAVENOUS

## 2021-04-01 MED ORDER — CEFAZOLIN SODIUM-DEXTROSE 2-4 GM/100ML-% IV SOLN
INTRAVENOUS | Status: AC
  Filled 2021-04-01: qty 100

## 2021-04-01 MED ORDER — DEXAMETHASONE SODIUM PHOSPHATE 10 MG/ML IJ SOLN
INTRAMUSCULAR | Status: DC | PRN
  Administered 2021-04-01: 5 mg via INTRAVENOUS

## 2021-04-01 MED ORDER — ONDANSETRON HCL 4 MG/2ML IJ SOLN: 4.0000 mg | Freq: Four times a day (QID) | INTRAMUSCULAR | Status: DC | PRN

## 2021-04-01 MED ORDER — METOCLOPRAMIDE HCL 10 MG PO TABS: 5.0000 mg | ORAL_TABLET | Freq: Three times a day (TID) | ORAL | Status: DC | PRN

## 2021-04-01 MED ORDER — BUPIVACAINE-EPINEPHRINE 0.5% -1:200000 IJ SOLN
INTRAMUSCULAR | Status: DC | PRN
  Administered 2021-04-01: 30 mL

## 2021-04-01 MED ORDER — ONDANSETRON HCL 4 MG PO TABS: 4.0000 mg | ORAL_TABLET | Freq: Four times a day (QID) | ORAL | Status: DC | PRN

## 2021-04-01 MED ORDER — PROPOFOL 10 MG/ML IV BOLUS
INTRAVENOUS | Status: DC | PRN
  Administered 2021-04-01: 150 mg via INTRAVENOUS

## 2021-04-01 MED ORDER — CEFAZOLIN SODIUM-DEXTROSE 2-4 GM/100ML-% IV SOLN
2.0000 g | INTRAVENOUS | Status: AC
  Administered 2021-04-01: 2 g via INTRAVENOUS

## 2021-04-01 MED ORDER — EPINEPHRINE PF 1 MG/ML IJ SOLN
INTRAMUSCULAR | Status: AC
  Filled 2021-04-01: qty 1

## 2021-04-01 MED ORDER — OXYCODONE HCL 5 MG PO TABS
5.0000 mg | ORAL_TABLET | ORAL | 0 refills | Status: DC | PRN
Start: 2021-04-01 — End: 2021-08-31

## 2021-04-01 MED ORDER — GLYCOPYRROLATE 0.2 MG/ML IJ SOLN
INTRAMUSCULAR | Status: DC | PRN
Start: 2021-04-01 — End: 2021-04-01
  Administered 2021-04-01: .2 mg via INTRAVENOUS

## 2021-04-01 MED ORDER — SODIUM CHLORIDE 0.9 % IV SOLN: INTRAVENOUS | Status: DC

## 2021-04-01 MED ORDER — CHLORHEXIDINE GLUCONATE 0.12 % MT SOLN
OROMUCOSAL | Status: AC
  Administered 2021-04-01: 15 mL via OROMUCOSAL
  Filled 2021-04-01: qty 15

## 2021-04-01 MED ORDER — FAMOTIDINE 20 MG PO TABS: 20.0000 mg | ORAL_TABLET | Freq: Once | ORAL | Status: AC

## 2021-04-01 MED ORDER — LACTATED RINGERS IV SOLN: INTRAVENOUS | Status: DC

## 2021-04-01 MED ORDER — ACETAMINOPHEN 10 MG/ML IV SOLN
INTRAVENOUS | Status: DC | PRN
  Administered 2021-04-01: 1000 mg via INTRAVENOUS

## 2021-04-01 SURGICAL SUPPLY — 55 items
ANCH SUT 5.5 KNTLS (Anchor) ×2 IMPLANT
ANCH SUT Q-FX 2.8 (Anchor) ×2 IMPLANT
ANCHOR ALL-SUT Q-FIX 2.8 (Anchor) ×4 IMPLANT
ANCHOR HEALICOIL REGEN 5.5 (Anchor) ×4 IMPLANT
APL PRP STRL LF DISP 70% ISPRP (MISCELLANEOUS) ×2
BIT DRILL JUGRKNT W/NDL BIT2.9 (DRILL) ×1 IMPLANT
BLADE FULL RADIUS 3.5 (BLADE) ×2 IMPLANT
BUR ACROMIONIZER 4.0 (BURR) ×2 IMPLANT
CANNULA SHAVER 8MMX76MM (CANNULA) ×2 IMPLANT
CHLORAPREP W/TINT 26 (MISCELLANEOUS) ×4 IMPLANT
COVER MAYO STAND REUSABLE (DRAPES) ×2 IMPLANT
DILATOR 5.5 THREADED HEALICOIL (MISCELLANEOUS) ×2 IMPLANT
DRAPE IMP U-DRAPE 54X76 (DRAPES) ×4 IMPLANT
DRILL JUGGERKNOT W/NDL BIT 2.9 (DRILL) ×2
ELECT CAUTERY BLADE 6.4 (BLADE) ×2 IMPLANT
ELECT REM PT RETURN 9FT ADLT (ELECTROSURGICAL) ×2
ELECTRODE REM PT RTRN 9FT ADLT (ELECTROSURGICAL) ×1 IMPLANT
GAUZE SPONGE 4X4 12PLY STRL (GAUZE/BANDAGES/DRESSINGS) ×4 IMPLANT
GAUZE XEROFORM 1X8 LF (GAUZE/BANDAGES/DRESSINGS) ×2 IMPLANT
GLOVE SRG 8 PF TXTR STRL LF DI (GLOVE) ×1 IMPLANT
GLOVE SURG ENC MOIS LTX SZ7.5 (GLOVE) ×4 IMPLANT
GLOVE SURG ENC MOIS LTX SZ8 (GLOVE) ×4 IMPLANT
GLOVE SURG UNDER LTX SZ8 (GLOVE) ×2 IMPLANT
GLOVE SURG UNDER POLY LF SZ8 (GLOVE) ×2
GOWN STRL REUS W/ TWL LRG LVL3 (GOWN DISPOSABLE) ×1 IMPLANT
GOWN STRL REUS W/ TWL XL LVL3 (GOWN DISPOSABLE) ×1 IMPLANT
GOWN STRL REUS W/TWL LRG LVL3 (GOWN DISPOSABLE) ×2
GOWN STRL REUS W/TWL XL LVL3 (GOWN DISPOSABLE) ×2
GRASPER SUT 15 45D LOW PRO (SUTURE) IMPLANT
IV LACTATED RINGER IRRG 3000ML (IV SOLUTION) ×2
IV LR IRRIG 3000ML ARTHROMATIC (IV SOLUTION) ×1 IMPLANT
KIT CANNULA 8X76-LX IN CANNULA (CANNULA) IMPLANT
MANIFOLD NEPTUNE II (INSTRUMENTS) ×2 IMPLANT
MASK FACE SPIDER DISP (MASK) ×2 IMPLANT
MAT ABSORB  FLUID 56X50 GRAY (MISCELLANEOUS) ×1
MAT ABSORB FLUID 56X50 GRAY (MISCELLANEOUS) ×1 IMPLANT
PACK ARTHROSCOPY SHOULDER (MISCELLANEOUS) ×2 IMPLANT
PAD ABD DERMACEA PRESS 5X9 (GAUZE/BANDAGES/DRESSINGS) ×4 IMPLANT
PAD WRAPON POLAR SHDR XLG (MISCELLANEOUS) ×1 IMPLANT
PASSER SUT FIRSTPASS SELF (INSTRUMENTS) ×2 IMPLANT
SLING ARM LRG DEEP (SOFTGOODS) IMPLANT
SLING ULTRA II LG (MISCELLANEOUS) ×2 IMPLANT
SPONGE T-LAP 18X18 ~~LOC~~+RFID (SPONGE) ×2 IMPLANT
STAPLER SKIN PROX 35W (STAPLE) ×2 IMPLANT
STRAP SAFETY 5IN WIDE (MISCELLANEOUS) ×2 IMPLANT
SUT ETHIBOND 0 MO6 C/R (SUTURE) ×2 IMPLANT
SUT ULTRABRAID 2 COBRAID 38 (SUTURE) IMPLANT
SUT VIC AB 2-0 CT1 27 (SUTURE) ×4
SUT VIC AB 2-0 CT1 TAPERPNT 27 (SUTURE) ×2 IMPLANT
TAPE MICROFOAM 4IN (TAPE) ×2 IMPLANT
TUBING CONNECTING 10 (TUBING) ×2 IMPLANT
TUBING INFLOW SET DBFLO PUMP (TUBING) ×2 IMPLANT
WAND WEREWOLF FLOW 90D (MISCELLANEOUS) ×2 IMPLANT
WATER STERILE IRR 500ML POUR (IV SOLUTION) ×2 IMPLANT
WRAPON POLAR PAD SHDR XLG (MISCELLANEOUS) ×2

## 2021-04-01 NOTE — Anesthesia Procedure Notes (Signed)
Procedure Name: Intubation Date/Time: 04/01/2021 2:10 PM Performed by: Lowry Bowl, CRNA Pre-anesthesia Checklist: Patient identified, Emergency Drugs available, Suction available and Patient being monitored Patient Re-evaluated:Patient Re-evaluated prior to induction Oxygen Delivery Method: Circle system utilized Preoxygenation: Pre-oxygenation with 100% oxygen Induction Type: IV induction and Cricoid Pressure applied Ventilation: Mask ventilation without difficulty Laryngoscope Size: McGraph and 4 Grade View: Grade I Tube type: Oral Tube size: 7.0 mm Number of attempts: 1 Airway Equipment and Method: Stylet and Video-laryngoscopy Placement Confirmation: ETT inserted through vocal cords under direct vision, positive ETCO2 and breath sounds checked- equal and bilateral Secured at: 23 cm Tube secured with: Tape Dental Injury: Teeth and Oropharynx as per pre-operative assessment  Comments: Neck kept neutral and stable throughout induction/intubation

## 2021-04-01 NOTE — Discharge Instructions (Addendum)
Orthopedic discharge instructions: Keep dressing dry and intact.  May shower after dressing changed on post-op day #4 (Monday).  Cover staples with Band-Aids after drying off. Apply ice frequently to shoulder or use polar care device. Take ibuprofen 600-800 mg TID with meals for 5-7 days, then as necessary. Take oxycodone as prescribed when needed.  May supplement with ES Tylenol if necessary. Keep shoulder immobilizer on at all times except may remove for bathing purposes. Follow-up in 10-14 days or as scheduled.   AMBULATORY SURGERY  DISCHARGE INSTRUCTIONS   The drugs that you were given will stay in your system until tomorrow so for the next 24 hours you should not:  Drive an automobile Make any legal decisions Drink any alcoholic beverage   You may resume regular meals tomorrow.  Today it is better to start with liquids and gradually work up to solid foods.  You may eat anything you prefer, but it is better to start with liquids, then soup and crackers, and gradually work up to solid foods.   Please notify your doctor immediately if you have any unusual bleeding, trouble breathing, redness and pain at the surgery site, drainage, fever, or pain not relieved by medication.    Additional Instructions:  Please contact your physician with any problems or Same Day Surgery at [phone], Monday through Friday 6 am to 4 pm, or [place] at [facility] number at [phone].

## 2021-04-01 NOTE — Op Note (Signed)
04/01/2021  3:52 PM  Patient:   Evan Giles  Pre-Op Diagnosis:   Impingement/tendinopathy with rotator cuff tear, left shoulder.  Post-Op Diagnosis:   Impingement/tendinopathy with full-thickness rotator cuff tear, synovitis, and degenerative labral fraying, left shoulder.  Procedure:   Extensive arthroscopic debridement, arthroscopic subacromial decompression, and mini-open rotator cuff repair, left shoulder.  Anesthesia:   General endotracheal with interscalene block using Exparel placed preoperatively by the anesthesiologist.  Surgeon:   Pascal Lux, MD  Assistant:   Cameron Proud, PA-C  Findings:   As above.  There was a full-thickness tear involving the anterior and middle thirds of the supraspinatus tendon insertional fibers. The remainder of the rotator cuff was in excellent condition. There was moderate labral fraying superiorly and posterosuperiorly without frank detachment from the glenoid rim. The biceps tendon had torn and retracted distally, leaving a stump still attached to the superior glenoid in the joint. The articular surfaces of the glenoid and humerus both were in satisfactory condition.  Complications:   None  Fluids:   800 cc  Estimated blood loss:   15 cc  Tourniquet time:   None  Drains:   None  Closure:   Staples      Brief clinical note:   The patient is a 80 year old male with a history of left shoulder pain and weakness following a fall. The patient's symptoms have progressed despite medications, activity modification, etc. The patient's history and examination are consistent with impingement/tendinopathy with a rotator cuff tear. These findings were confirmed by MRI scan. The patient presents at this time for definitive management of his shoulder symptoms.  Procedure:   The patient underwent placement of an interscalene block using Exparel by the anesthesiologist in the preoperative holding area before being brought into the operating room and  lain in the supine position. The patient then underwent general endotracheal intubation and anesthesia before being repositioned in the beach chair position using the beach chair positioner. The left shoulder and upper extremity were prepped with ChloraPrep solution before being draped sterilely. Preoperative antibiotics were administered. A timeout was performed to confirm the proper surgical site before the expected portal sites and incision site were injected with 0.5% Sensorcaine with epinephrine. A posterior portal was created and the glenohumeral joint thoroughly inspected with the findings as described above.   An anterior portal was created using an outside-in technique. The labrum and rotator cuff were further probed, again confirming the above-noted findings. The frayed portion of the labrum was debrided back to stable margins using the full-radius resector, as were areas of extensive synovitis. The remaining biceps stump also was debrided back to the labrum using both the full-radius resector and the ArthroCare wand. The ArthroCare wand was inserted and used to complete the debridement of the biceps stump. It also was used to obtain hemostasis as well as to "anneal" the labrum superiorly and anteriorly. The instruments were removed from the joint after suctioning the excess fluid.  The camera was repositioned through the posterior portal into the subacromial space. A separate lateral portal was created using an outside-in technique. The 3.5 mm full-radius resector was introduced and used to perform a subtotal bursectomy. The ArthroCare wand was then inserted and used to remove the periosteal tissue off the undersurface of the anterior third of the acromion as well as to recess the coracoacromial ligament from its attachment along the anterior and lateral margins of the acromion. The 4.0 mm acromionizing bur was introduced and used to complete the decompression  by removing the undersurface of the  anterior third of the acromion. The full radius resector was reintroduced to remove any residual bony debris before the ArthroCare wand was reintroduced to obtain hemostasis. The instruments were then removed from the subacromial space after suctioning the excess fluid.  An approximately 4-5 cm incision was made over the anterolateral aspect of the shoulder beginning at the anterolateral corner of the acromion and extending distally in line with the bicipital groove. This incision was carried down through the subcutaneous tissues to expose the deltoid fascia. The raphae between the anterior and middle thirds was identified and this plane developed to provide access into the subacromial space. Additional bursal tissues were debrided sharply using Metzenbaum scissors. The rotator cuff tear was readily identified. The margins were debrided sharply with a #15 blade and the exposed greater tuberosity roughened with a rongeur. The tear was repaired using two [employer] 2.8 mm Q-Fix anchors. These sutures were then brought back laterally and secured using two [place] knotless RegeneSorb anchors to create a two-layer closure. An apparent watertight closure was obtained.  The wound was copiously irrigated with sterile saline solution before the deltoid raphae was reapproximated using 2-0 Vicryl interrupted sutures. The subcutaneous tissues were closed in two layers using 2-0 Vicryl interrupted sutures before the skin was closed using staples. The portal sites also were closed using staples. A sterile bulky dressing was applied to the shoulder before a Polar Care device was applied and the arm placed into a shoulder immobilizer. The patient was then awakened, extubated, and returned to the recovery room in satisfactory condition after tolerating the procedure well.

## 2021-04-01 NOTE — Anesthesia Preprocedure Evaluation (Signed)
Anesthesia Evaluation  Patient identified by MRN, date of birth, ID band Patient awake    Reviewed: Allergy & Precautions, NPO status , Patient's Chart, lab work & pertinent test results  History of Anesthesia Complications Negative for: history of anesthetic complications  Airway Mallampati: II  TM Distance: >3 FB Neck ROM: Limited    Dental  (+) Teeth Intact   Pulmonary neg pulmonary ROS, neg sleep apnea, neg COPD, Patient abstained from smoking.Not current smoker,    Pulmonary exam normal breath sounds clear to auscultation       Cardiovascular Exercise Tolerance: Good METShypertension, (-) CAD and (-) Past MI (-) dysrhythmias  Rhythm:Regular Rate:Normal - Systolic murmurs    Neuro/Psych  Neuromuscular disease negative psych ROS   GI/Hepatic PUD, neg GERD  ,(+)     (-) substance abuse  ,   Endo/Other  neg diabetes  Renal/GU negative Renal ROS     Musculoskeletal  (+) Arthritis ,   Abdominal   Peds  Hematology   Anesthesia Other Findings Past Medical History: No date: Diverticulitis No date: Hard of hearing No date: Hyperlipidemia No date: Hypertension No date: Lumbar stenosis with neurogenic claudication No date: Osteoarthrosis of knee No date: Peptic ulcer     Comment:  years ago No date: Wears hearing aid in both ears  Reproductive/Obstetrics                             Anesthesia Physical Anesthesia Plan  ASA: 2  Anesthesia Plan: General   Post-op Pain Management: Regional block and Ofirmev IV (intra-op)   Induction: Intravenous  PONV Risk Score and Plan: 2 and Ondansetron, Dexamethasone, Midazolam and Treatment may vary due to age or medical condition  Airway Management Planned: Oral ETT  Additional Equipment: None  Intra-op Plan:   Post-operative Plan: Extubation in OR  Informed Consent: I have reviewed the patients History and Physical, chart, labs and  discussed the procedure including the risks, benefits and alternatives for the proposed anesthesia with the patient or authorized representative who has indicated his/her understanding and acceptance.     Dental advisory given  Plan Discussed with: CRNA and Surgeon  Anesthesia Plan Comments: (Discussed risks of anesthesia with patient, including PONV, sore throat, lip/dental/eye damage. Rare risks discussed as well, such as cardiorespiratory and neurological sequelae, and allergic reactions. Discussed the role of CRNA in patient's perioperative care. Patient understands. Discussed r/b/a of interscalene block, including elective nature. Risks discussed: - Rare: bleeding, infection, nerve damage - shortness of breath from hemidiaphragmatic paralysis - unilateral horner's syndrome - poor/non-working blocks Patient understands and agrees. )        Anesthesia Quick Evaluation

## 2021-04-01 NOTE — Transfer of Care (Signed)
Immediate Anesthesia Transfer of Care Note  Patient: Evan Giles  Procedure(s) Performed: SHOULDER ARTHROSCOPY WITH DEBRIDEMENT, DECOMPRESSION, MINI OPEN ROTATOR CUFF REPAIR. (Left: Shoulder)  Patient Location: PACU  Anesthesia Type:GA combined with regional for post-op pain  Level of Consciousness: awake, drowsy and patient cooperative  Airway & Oxygen Therapy: Patient Spontanous Breathing and Patient connected to face mask oxygen  Post-op Assessment: Report given to RN and Post -op Vital signs reviewed and stable  Post vital signs: Reviewed and stable  Last Vitals:  Vitals Value Taken Time  BP 143/62 04/01/21 1547  Temp    Pulse 70 04/01/21 1553  Resp 21 04/01/21 1553  SpO2 95 % 04/01/21 1553  Vitals shown include unvalidated device data.  Last Pain:  Vitals:   04/01/21 1128  TempSrc: Oral         Complications: No notable events documented.

## 2021-04-01 NOTE — Anesthesia Procedure Notes (Signed)
Anesthesia Regional Block: Interscalene brachial plexus block   Pre-Anesthetic Checklist: , timeout performed,  Correct Patient, Correct Site, Correct Laterality,  Correct Procedure, Correct Position, site marked,  Risks and benefits discussed,  Surgical consent,  Pre-op evaluation,  At surgeon's request and post-op pain management  Laterality: Left  Prep: chloraprep       Needles:  Injection technique: Single-shot  Needle Type: Echogenic Needle     Needle Length: 4cm  Needle Gauge: 25     Additional Needles:   Narrative:  Start time: 04/01/2021 1:08 PM End time: 04/01/2021 1:10 PM Injection made incrementally with aspirations every 5 mL.  Performed by: Personally  Anesthesiologist: Arita Miss, MD  Additional Notes: Patient's chart reviewed and they were deemed appropriate candidate for procedure, at surgeon's request. Patient educated about risks, benefits, and alternatives of the block including but not limited to: temporary or permanent nerve damage, bleeding, infection, damage to surround tissues, pneumothorax, hemidiaphragmatic paralysis, unilateral Horner's syndrome, block failure, local anesthetic toxicity. Patient expressed understanding. A formal time-out was conducted consistent with institution rules.  Monitors were applied, and minimal sedation used (see nursing record). The site was prepped with skin prep and allowed to dry, and sterile gloves were used. A high frequency linear ultrasound probe with probe cover was utilized throughout. C5-7 nerve roots located and appeared anatomically normal, local anesthetic injected around them, and echogenic block needle trajectory was monitored throughout. Aspiration performed every 76ml. Lung and blood vessels were avoided. All injections were performed without resistance and free of blood and paresthesias. The patient tolerated the procedure well.  Injectate: 58ml exparel + 85ml 0.5% bupivacaine

## 2021-04-01 NOTE — H&P (Signed)
History of Present Illness:  Evan Giles is a 80 y.o. adult that presents to clinic today for Evan Giles's preoperative history and evaluation. Patient presents unaccompanied. The patient is scheduled to undergo a left shoulder arthroscopy with debridement, decompression, mini open rotator cuff repair and possible biceps tenodesis on 04/01/21 by Dr. Roland Giles. The patient reports a 68-month history of left shoulder pain as well as decreased range of motion dating back to an injury to the left shoulder while working to poke a hole in his leather belt. He felt a pop in the shoulder at that time and has had increased symptoms since. Patient subsequently underwent an MRI of the left shoulder which revealed tear of the Evan head of the biceps tendon as well as a partial tear of the supraspinatus.  The patient's symptoms have progressed to the point that they decrease Evan Giles's quality of life. The patient has previously undergone conservative treatment including NSAIDS and activity modification without adequate control of Evan Giles's symptoms.  Denies significant cardiac history, history of blood clots. Patient does have history of a cervical fusion at C4-C6.  Past Medical History:   Chickenpox   Hyperlipidemia   Hypertension   Past Surgical History:   Cervical surgery 12/2017 (Dr. Earle Giles)   L3-4, L4-5 laminectomies, foraminotomies 06/20/2018 (Dr. Earle Giles)   Left inguinal hernia repair with mesh 05/23/2018 (Dr. Autumn Giles)   Left knee arthroscopy 06/02/2010 (partial medial meniscectomy, chondroplasty of the medial femoral condyle, medial tibial plateau and patellofemoral articulation)   Left total knee arthroplasty 07/06/2011 (Dr. Marry Giles)   Right knee arthroscopy, partial medial and lateral meniscectomies, and chondroplasty 09/07/2015 (Dr Evan Giles)   Right knee arthroscopy, partial medial and lateral meniscectomies, and chondroplasty 09/25/2017 (Dr Evan Giles)   Right knee  arthroscopy, partial medial meniscectomy, and chondroplasty 04/10/2019 (Dr Evan Giles)   Evan Giles   Current Medications:   acetaminophen (TYLENOL) 500 MG tablet Take 500 mg by mouth as needed for Pain   aspirin 81 MG EC tablet Take 81 mg by mouth once daily   atorvastatin (LIPITOR) 20 MG tablet TAKE ONE TABLET BY MOUTH EVERY DAY   calcium citrate-vitamin D3 (CITRACAL+D) 315-200 mg-unit tablet Take 1 tablet by mouth once daily   losartan (COZAAR) 50 MG tablet Take 50 mg by mouth once daily   magnesium chloride, bulk, Crys Take 1 tablet by mouth once daily   tamsulosin (FLOMAX) 0.4 mg capsule Take 0.8 mg by mouth nightly   Allergies:  No Known Allergies  Social History:   Socioeconomic History:   Marital status: Married  Spouse name: Evan Giles   Number of children: 2   Years of education: 36  Occupational History   Occupation: Animator -Self Employed -Herbalist  Tobacco Use   Smoking status: Never   Smokeless tobacco: Never  Scientific laboratory technician Use: Never used  Substance and Sexual Activity   Alcohol use: No  Alcohol/week: 0.0 standard drinks   Drug use: No   Sexual activity: Yes  Partners: Female   Family History:   Heart failure Father   Review of Systems:  A 10+ ROS was performed, reviewed, and the pertinent orthopaedic findings are documented in the HPI.   Physical Examination:  BP 122/78 (BP Location: Left upper arm, Patient Position: Sitting, BP Cuff Size: Adult)  Ht 182.9 cm (6')  Wt (!) 112 kg (247 lb)  BMI 33.50 kg/m   Patient is a well-developed, well-nourished adult in no acute distress. Patient has  normal mood and affect. Patient is alert and oriented to person, place, and time.   HEENT: Atraumatic, normocephalic. Pupils equal and reactive to light. Extraocular motion intact. Noninjected sclera.  Cardiovascular: Regular rate and rhythm, with no murmurs, rubs, or gallops. Radial pulse 2+  Respiratory: Lungs clear to auscultation bilaterally.    Left Upper Extremity: Examination of the left shoulder and arm showed no bony abnormality or edema. The patient has full passive range of motion to the left upper extremity. With forward flexion he is able to reach 130 degrees, abduction 115 degrees with moderate discomfort. With the left arm abducted 90 degrees he can tolerate external rotation 80 degrees, internal rotation 70 degrees. The patient has a positive drop arm test. The patient is non-tender along the deltoid muscle. There is moderate left subacromial space tenderness with no AC joint tenderness. No Popeye deformity can be visualized. The patient has no instability of the shoulder with anterior-posterior motion. There is a negative sulcus sign. The rotator cuff muscle strength is 3/5 with supraspinatus, 4/5 with internal rotation, and 4/5 with external rotation. There is no crepitus with range of motion activities.   Sensation is intact over the median, radial, ulnar, and axillary nerve distributions. Patient able to make an OK sign, thumbs up, and criss-cross the 2nd and 3rd digits.   MRI OF THE LEFT SHOULDER WITHOUT CONTRAST:  1. Full-thickness, partial width of the anterior distal  supraspinatus tendon. No muscle atrophy.  2. Moderate infraspinatus tendinosis.  3. Completely torn and and retracted biceps Evan head tendon.  4. Degenerated and torn superior labrum.  5. Moderate acromioclavicular and mild glenohumeral osteoarthritis.   Impression:  1. Nontraumatic complete tear of left rotator cuff.  2. Primary osteoarthritis of left shoulder.   Plan:  The treatment options, including both surgical and nonsurgical choices, have been discussed in detail with the patient. The patient would like to proceed with surgical intervention, specifically a left shoulder arthroscopy with debridement, decompression, rotator cuff repair, and possible biceps tenodesis. The risks (including bleeding, infection, nerve and/or blood vessel injury,  persistent or recurrent pain, failure of the repair, recurrence of the tear, weakness, limited range of motion, need for further surgery, blood clots, strokes, heart attacks or arrhythmias, pneumonia, etc.) and benefits of the surgical procedure were discussed. The patient states his/her understanding and agrees to proceed. A formal written consent will be obtained by the nursing staff.   H&P reviewed and patient re-examined. No changes.

## 2021-04-01 NOTE — Anesthesia Postprocedure Evaluation (Signed)
Anesthesia Post Note  Patient: Evan Giles  Procedure(s) Performed: SHOULDER ARTHROSCOPY WITH DEBRIDEMENT, DECOMPRESSION, MINI OPEN ROTATOR CUFF REPAIR. (Left: Shoulder)  Patient location during evaluation: PACU Anesthesia Type: General Level of consciousness: awake and alert Pain management: pain level controlled Vital Signs Assessment: post-procedure vital signs reviewed and stable Respiratory status: spontaneous breathing, nonlabored ventilation, respiratory function stable and patient connected to nasal cannula oxygen Cardiovascular status: blood pressure returned to baseline and stable Postop Assessment: no apparent nausea or vomiting Anesthetic complications: no   No notable events documented.   Last Vitals:  Vitals:   04/01/21 1610 04/01/21 1623  BP: 123/79 102/71  Pulse: 70 66  Resp: 16 17  Temp: (!) 36.1 C (!) 36.1 C  SpO2: 95% 96%    Last Pain:  Vitals:   04/01/21 1623  TempSrc: Temporal  PainSc: 0-No pain                 Precious Haws Cj [place]gell

## 2021-04-02 ENCOUNTER — Encounter: Payer: Self-pay | Admitting: Surgery

## 2021-04-06 ENCOUNTER — Telehealth: Payer: Self-pay

## 2021-04-06 DIAGNOSIS — Z9889 Other specified postprocedural states: Secondary | ICD-10-CM | POA: Diagnosis not present

## 2021-04-06 NOTE — Telephone Encounter (Signed)
Left message to call office or send a MyChart message to see how he is doing after recent shoulder surgery.

## 2021-04-06 NOTE — Telephone Encounter (Addendum)
Pt returning call. Pt said he is doing fine and he is going to rehab today. Pt stated that is BP was 200 on Sunday and now it is gone down to 160/70

## 2021-04-06 NOTE — Telephone Encounter (Signed)
Left detailed message on VM per DPR. 

## 2021-04-13 DIAGNOSIS — M25612 Stiffness of left shoulder, not elsewhere classified: Secondary | ICD-10-CM | POA: Diagnosis not present

## 2021-04-13 DIAGNOSIS — Z9889 Other specified postprocedural states: Secondary | ICD-10-CM | POA: Diagnosis not present

## 2021-04-21 DIAGNOSIS — Z9889 Other specified postprocedural states: Secondary | ICD-10-CM | POA: Diagnosis not present

## 2021-04-27 DIAGNOSIS — Z9889 Other specified postprocedural states: Secondary | ICD-10-CM | POA: Diagnosis not present

## 2021-05-04 DIAGNOSIS — Z9889 Other specified postprocedural states: Secondary | ICD-10-CM | POA: Diagnosis not present

## 2021-05-11 ENCOUNTER — Other Ambulatory Visit: Payer: Self-pay | Admitting: Internal Medicine

## 2021-05-11 DIAGNOSIS — Z9889 Other specified postprocedural states: Secondary | ICD-10-CM | POA: Diagnosis not present

## 2021-05-11 NOTE — Telephone Encounter (Signed)
Pt has 100 mg on his med list but request was for 50mg . Called and spoke to pt. He said he is on the 50mg . He has some medication on hand and will wait for approval from Dr Silvio Pate.

## 2021-05-14 DIAGNOSIS — M25612 Stiffness of left shoulder, not elsewhere classified: Secondary | ICD-10-CM | POA: Diagnosis not present

## 2021-05-14 DIAGNOSIS — Z9889 Other specified postprocedural states: Secondary | ICD-10-CM | POA: Diagnosis not present

## 2021-05-17 DIAGNOSIS — Z9889 Other specified postprocedural states: Secondary | ICD-10-CM | POA: Diagnosis not present

## 2021-05-20 DIAGNOSIS — Z9889 Other specified postprocedural states: Secondary | ICD-10-CM | POA: Diagnosis not present

## 2021-05-24 DIAGNOSIS — Z9889 Other specified postprocedural states: Secondary | ICD-10-CM | POA: Diagnosis not present

## 2021-05-25 DIAGNOSIS — L400 Psoriasis vulgaris: Secondary | ICD-10-CM | POA: Diagnosis not present

## 2021-05-25 DIAGNOSIS — Z86018 Personal history of other benign neoplasm: Secondary | ICD-10-CM | POA: Diagnosis not present

## 2021-05-25 DIAGNOSIS — L578 Other skin changes due to chronic exposure to nonionizing radiation: Secondary | ICD-10-CM | POA: Diagnosis not present

## 2021-05-25 DIAGNOSIS — Z859 Personal history of malignant neoplasm, unspecified: Secondary | ICD-10-CM | POA: Diagnosis not present

## 2021-05-31 DIAGNOSIS — Z961 Presence of intraocular lens: Secondary | ICD-10-CM | POA: Diagnosis not present

## 2021-05-31 DIAGNOSIS — Z9889 Other specified postprocedural states: Secondary | ICD-10-CM | POA: Diagnosis not present

## 2021-06-03 DIAGNOSIS — Z9889 Other specified postprocedural states: Secondary | ICD-10-CM | POA: Diagnosis not present

## 2021-06-07 DIAGNOSIS — Z9889 Other specified postprocedural states: Secondary | ICD-10-CM | POA: Diagnosis not present

## 2021-06-10 DIAGNOSIS — Z9889 Other specified postprocedural states: Secondary | ICD-10-CM | POA: Diagnosis not present

## 2021-06-18 DIAGNOSIS — Z9889 Other specified postprocedural states: Secondary | ICD-10-CM | POA: Diagnosis not present

## 2021-06-21 DIAGNOSIS — Z9889 Other specified postprocedural states: Secondary | ICD-10-CM | POA: Diagnosis not present

## 2021-06-24 DIAGNOSIS — Z9889 Other specified postprocedural states: Secondary | ICD-10-CM | POA: Diagnosis not present

## 2021-06-28 DIAGNOSIS — Z9889 Other specified postprocedural states: Secondary | ICD-10-CM | POA: Diagnosis not present

## 2021-07-01 DIAGNOSIS — Z9889 Other specified postprocedural states: Secondary | ICD-10-CM | POA: Diagnosis not present

## 2021-07-02 DIAGNOSIS — M24112 Other articular cartilage disorders, left shoulder: Secondary | ICD-10-CM | POA: Diagnosis not present

## 2021-07-02 DIAGNOSIS — S46102D Unspecified injury of muscle, fascia and tendon of long head of biceps, left arm, subsequent encounter: Secondary | ICD-10-CM | POA: Diagnosis not present

## 2021-07-02 DIAGNOSIS — M7582 Other shoulder lesions, left shoulder: Secondary | ICD-10-CM | POA: Diagnosis not present

## 2021-07-02 DIAGNOSIS — S46012D Strain of muscle(s) and tendon(s) of the rotator cuff of left shoulder, subsequent encounter: Secondary | ICD-10-CM | POA: Diagnosis not present

## 2021-08-31 ENCOUNTER — Ambulatory Visit (INDEPENDENT_AMBULATORY_CARE_PROVIDER_SITE_OTHER): Payer: Medicare Other | Admitting: Internal Medicine

## 2021-08-31 ENCOUNTER — Encounter: Payer: Self-pay | Admitting: Internal Medicine

## 2021-08-31 VITALS — BP 110/76 | HR 57 | Temp 98.2°F | Ht 71.0 in | Wt 244.0 lb

## 2021-08-31 DIAGNOSIS — G959 Disease of spinal cord, unspecified: Secondary | ICD-10-CM | POA: Diagnosis not present

## 2021-08-31 DIAGNOSIS — I10 Essential (primary) hypertension: Secondary | ICD-10-CM | POA: Diagnosis not present

## 2021-08-31 DIAGNOSIS — R7303 Prediabetes: Secondary | ICD-10-CM

## 2021-08-31 DIAGNOSIS — Z1211 Encounter for screening for malignant neoplasm of colon: Secondary | ICD-10-CM | POA: Diagnosis not present

## 2021-08-31 DIAGNOSIS — Z0000 Encounter for general adult medical examination without abnormal findings: Secondary | ICD-10-CM | POA: Diagnosis not present

## 2021-08-31 DIAGNOSIS — N401 Enlarged prostate with lower urinary tract symptoms: Secondary | ICD-10-CM

## 2021-08-31 DIAGNOSIS — N138 Other obstructive and reflux uropathy: Secondary | ICD-10-CM | POA: Diagnosis not present

## 2021-08-31 LAB — HEMOGLOBIN A1C: Hgb A1c MFr Bld: 6.2 % (ref 4.6–6.5)

## 2021-08-31 LAB — CBC
HCT: 40.8 % (ref 39.0–52.0)
Hemoglobin: 13.6 g/dL (ref 13.0–17.0)
MCHC: 33.4 g/dL (ref 30.0–36.0)
MCV: 85.8 fl (ref 78.0–100.0)
Platelets: 247 10*3/uL (ref 150.0–400.0)
RBC: 4.75 Mil/uL (ref 4.22–5.81)
RDW: 14.5 % (ref 11.5–15.5)
WBC: 6.6 10*3/uL (ref 4.0–10.5)

## 2021-08-31 LAB — COMPREHENSIVE METABOLIC PANEL
ALT: 8 U/L (ref 0–53)
AST: 15 U/L (ref 0–37)
Albumin: 4.2 g/dL (ref 3.5–5.2)
Alkaline Phosphatase: 74 U/L (ref 39–117)
BUN: 15 mg/dL (ref 6–23)
CO2: 28 mEq/L (ref 19–32)
Calcium: 8.8 mg/dL (ref 8.4–10.5)
Chloride: 105 mEq/L (ref 96–112)
Creatinine, Ser: 0.81 mg/dL (ref 0.40–1.50)
GFR: 84.07 mL/min (ref >60.00)
Glucose, Bld: 107 mg/dL — ABNORMAL HIGH (ref 70–99)
Potassium: 4.3 mEq/L (ref 3.5–5.1)
Sodium: 139 mEq/L (ref 135–145)
Total Bilirubin: 0.6 mg/dL (ref 0.2–1.2)
Total Protein: 6.8 g/dL (ref 6.0–8.3)

## 2021-08-31 LAB — LIPID PANEL
Cholesterol: 172 mg/dL (ref 0–200)
HDL: 38 mg/dL — ABNORMAL LOW (ref >39.00)
LDL Cholesterol: 113 mg/dL — ABNORMAL HIGH (ref 0–99)
NonHDL: 133.88
Total CHOL/HDL Ratio: 5
Triglycerides: 102 mg/dL (ref 0.0–149.0)
VLDL: 20.4 mg/dL (ref 0.0–40.0)

## 2021-08-31 NOTE — Assessment & Plan Note (Signed)
Voids fairly well on tamsulosin 0.'4mg'$  daily ?

## 2021-08-31 NOTE — Progress Notes (Signed)
Hearing Screening - Comments:: Has hearing aids. Wearing them today. ?Vision Screening - Comments:: July 2022 ? ?

## 2021-08-31 NOTE — Assessment & Plan Note (Signed)
Ongoing mild symptoms since his surgery ?He will go back to Dr Arnoldo Morale for reevaluation ?

## 2021-08-31 NOTE — Progress Notes (Signed)
? ?Subjective:  ? ? Patient ID: Evan Giles, male    DOB: 09-Jul-1940, 81 y.o.   MRN: [id number] ? ?HPI ?Here for Medicare wellness visit and follow up of chronic health conditions ?Reviewed advanced directives ?Reviewed other doctors----Dr Jenkins---neurosurgery, Dr Katrine Coho, Dr Vena Austria, [facility], Dr Willette Alma replacement--dentist, Dr Babs Bertin ?Did have left shoulder surgery in December by Dr Roland Rack. Continues to work on rehab. No hospitalizations ?Does exercise regularly ?Vision is okay ?Hearing aides do help ?No alcohol or tobacco ?No falls---uses cane ?No depression or anhedonia ?Independent with instrumental ADLs ?No memory problems ? ?No chest pain  ?No SOB ?Some dizziness --more like a balance thing ?No syncope ?No edema ?No palpitations ? ?Doesn't check sugars ?Inconsistent with eating healthy---weight up some ? ?Continues on the tamsulosin ?Stream still slow--hard to start at times.  ?Does seem to empty okay ?Nocturia x 1 only ? ?Continues on the statin ? ?Still having trouble with his hands--starting to drop things again ?Gets some tightness in arms at times---delay in release ?Some cold sensation in legs also ? ?Current Outpatient Medications on File Prior to Visit  ?Medication Sig Dispense Refill  ? aspirin EC 81 MG tablet Take 81 mg by mouth daily.     ? atorvastatin (LIPITOR) 20 MG tablet TAKE 1 TABLET BY MOUTH DAILY 90 tablet 3  ? Calcium Citrate-Vitamin D (CALCIUM + D PO) Take 1 tablet by mouth daily.     ? losartan (COZAAR) 50 MG tablet TAKE 1 TABLET BY MOUTH DAILY 90 tablet 3  ? Magnesium 250 MG TABS Take 250 mg by mouth daily.     ? tamsulosin (FLOMAX) 0.4 MG CAPS capsule Take 0.8 mg by mouth at bedtime.     ? ?No current facility-administered medications on file prior to visit.  ? ? ?Allergies  ?Allergen Reactions  ? Hydrocodone Itching  ? ? ?Past Medical History:  ?Diagnosis Date  ? Diverticulitis   ? Hard of hearing   ? Hyperlipidemia   ? Hypertension   ? Lumbar stenosis  with neurogenic claudication   ? Osteoarthrosis of knee   ? Peptic ulcer   ? years ago  ? Wears hearing aid in both ears   ? ? ?Past Surgical History:  ?Procedure Laterality Date  ? ANTERIOR CERVICAL DECOMP/DISCECTOMY FUSION N/A 11/27/2019  ? Procedure: Cervical Four-Five Cervical Five-Six Anterior cervical decompression/discectomy/fusion with interbody prosthesis, plate, screws, explore fusion, removal of hardware;  Surgeon: Newman Pies, MD;  Location: [facility];  Service: Neurosurgery;  Laterality: N/A;  Cervical Four-Five Cervical Five-Six Anterior cervical decompression/discectomy/fusion with interbody prosthesis, plate, screws, ex  ? CATARACT EXTRACTION W/ INTRAOCULAR LENS IMPLANT Left 11/2010  ? CATARACT EXTRACTION W/PHACO Right 10/08/2019  ? Procedure: CATARACT EXTRACTION PHACO AND INTRAOCULAR LENS PLACEMENT ([place]) RIGHT;  Surgeon: Birder Robson, MD;  Location: [place];  Service: Ophthalmology;  Laterality: Right;  7.85 ?0:46.3  ? CHONDROPLASTY Right 04/10/2019  ? Procedure: CHONDROPLASTY;  Surgeon: Dereck Leep, MD;  Location: [facility];  Service: Orthopedics;  Laterality: Right;  ? colonoscopy  2010  ? dental implant  08/2015  ? INGUINAL HERNIA REPAIR Left 05/23/2018  ? Procedure: LEFT INGUINAL HERNIA REPAIR WITH MESH ERAS PATHWAY;  Surgeon: Jovita Kussmaul, MD;  Location: [facility];  Service: General;  Laterality: Left;  ? JOINT REPLACEMENT Left 06/2011  ? Left knee---Dr Hooten  ? KNEE ARTHROSCOPY Right 09/07/2015  ? Procedure: ARTHROSCOPY KNEE, PARTIAL MEDIAL AND LATERAL MENISECTOMY, chondroplasty of patella femoral.;  Surgeon: Dereck Leep, MD;  Location: [facility];  Service: Orthopedics;  Laterality: Right;  ? KNEE ARTHROSCOPY Right 09/25/2017  ? Procedure: ARTHROSCOPY KNEE;  Surgeon: Dereck Leep, MD;  Location: [facility];  Service: Orthopedics;  Laterality: Right;  ? KNEE ARTHROSCOPY WITH MEDIAL MENISECTOMY Right 09/25/2017  ? Procedure: KNEE ARTHROSCOPY WITH MEDIAL MENISECTOMY;  Surgeon:  Dereck Leep, MD;  Location: [facility];  Service: Orthopedics;  Laterality: Right;  ? KNEE ARTHROSCOPY WITH MEDIAL MENISECTOMY Right 04/10/2019  ? Procedure: KNEE ARTHROSCOPY WITH PARTIAL MEDIAL MENISECTOMY;  Surgeon: Dereck Leep, MD;  Location: [facility];  Service: Orthopedics;  Laterality: Right;  ? LUMBAR LAMINECTOMY/DECOMPRESSION MICRODISCECTOMY N/A 06/20/2018  ? Procedure: Lumbar 3-4, Lumbar 4-5 Laminectomy/Foraminotomy;  Surgeon: Newman Pies, MD;  Location: [place];  Service: Neurosurgery;  Laterality: N/A;  Lumbar 3-4, Lumbar 4-5 Laminectomy/Foraminotomy  ? MENISCUS DEBRIDEMENT Left 05/2010  ? Dr Roberts Gaudy  ? RETINAL LASER PROCEDURE Left 07/2010  ? Dr Darrick Grinder  ? SHOULDER ARTHROSCOPY WITH SUBACROMIAL DECOMPRESSION, ROTATOR CUFF REPAIR AND BICEP TENDON REPAIR Left 04/01/2021  ? Procedure: SHOULDER ARTHROSCOPY WITH DEBRIDEMENT, DECOMPRESSION, MINI OPEN ROTATOR CUFF REPAIR.;  Surgeon: Corky Mull, MD;  Location: [facility];  Service: Orthopedics;  Laterality: Left;  ? spinal stenosis surgery  2018, 01/10/18, 06/20/18  ? Metal plate  ? [place]  ? ? ?Family History  ?Problem Relation Age of Onset  ? Heart disease Father   ? Stroke Father   ?     not certain about this  ? Cancer Sister   ?     unknown  ? Diabetes Maternal Grandmother   ? Obesity Sister   ? ? ?Social History  ? ?Socioeconomic History  ? Marital status: Married  ?  Spouse name: [name]  ? Number of children: 2  ? Years of education: 72  ? Highest education level: Not on file  ?Occupational History  ? Occupation: Engineer, drilling  ?  Comment: mostly Medicare advantage  ?Tobacco Use  ? Smoking status: Never  ?  Passive exposure: Past  ? Smokeless tobacco: Never  ?Vaping Use  ? Vaping Use: Never used  ?Substance and Sexual Activity  ? Alcohol use: No  ?  Alcohol/week: 0.0 standard drinks  ? Drug use: No  ? Sexual activity: Not on file  ?Other Topics Concern  ? Not on file  ?Social History Narrative  ? I son  and 1 daughter  ? Has living will  ? Wife is health care POA --- alternate would be son  ? Would accept resuscitation---but no prolonged ventilation  ? Probably would accept feeding tube--at least for a limited time  ? Lives with wife  ? ?Social Determinants of Health  ? ?Financial Resource Strain: Not on file  ?Food Insecurity: Not on file  ?Transportation Needs: Not on file  ?Physical Activity: Not on file  ?Stress: Not on file  ?Social Connections: Not on file  ?Intimate Partner Violence: Not on file  ? ?Review of Systems ?Weight is actually about the same ?Sleeps okay  ?Wears seat belt ?Teeth okay---keeps up with dentist ?No suspicious skin lesions ?No heartburn or dysphagia ?Bowels move fine---no blood ?No sig back or joint pains-does feel his back with prolonged standing ?   ?Objective:  ? Physical Exam ?Constitutional:   ?   Appearance: Normal appearance.  ?HENT:  ?   Mouth/Throat:  ?   Comments: No lesions ?Eyes:  ?   Conjunctiva/sclera: Conjunctivae normal.  ?   Pupils: Pupils are equal,  round, and reactive to light.  ?Cardiovascular:  ?   Rate and Rhythm: Normal rate and regular rhythm.  ?   Pulses: Normal pulses.  ?   Heart sounds: No murmur heard. ?  No gallop.  ?Pulmonary:  ?   Effort: Pulmonary effort is normal.  ?   Breath sounds: Normal breath sounds. No wheezing or rales.  ?Abdominal:  ?   Palpations: Abdomen is soft.  ?   Tenderness: There is no abdominal tenderness.  ?Musculoskeletal:  ?   Cervical back: Neck supple.  ?   Right lower leg: No edema.  ?   Left lower leg: No edema.  ?Lymphadenopathy:  ?   Cervical: No cervical adenopathy.  ?Skin: ?   Findings: No lesion or rash.  ?Neurological:  ?   General: No focal deficit present.  ?   Mental Status: He is alert and oriented to person, place, and time.  ?   Comments: Mini-cog normal ?Normal strength in arms/hands  ?Psychiatric:     ?   Mood and Affect: Mood normal.     ?   Behavior: Behavior normal.  ?  ? ? ? ? ?   ?Assessment & Plan:  ? ?

## 2021-08-31 NOTE — Assessment & Plan Note (Signed)
I have personally reviewed the Medicare Annual Wellness questionnaire and have noted ?1. The patient's medical and social history ?2. Their use of alcohol, tobacco or illicit drugs ?3. Their current medications and supplements ?4. The patient's functional ability including ADL's, fall risks, home safety risks and hearing or visual ?            impairment. ?5. Diet and physical activities ?6. Evidence for depression or mood disorders ? ?The patients weight, height, BMI and visual acuity have been recorded in the chart ?I have made referrals, counseling and provided education to the patient based review of the above and I have provided the pt with a written personalized care plan for preventive services. ? ?I have provided you with a copy of your personalized plan for preventive services. Please take the time to review along with your updated medication list. ? ?FIT yearly till 71 ?No more PSA due to age ?Recommended bivalent COVID ?Flu vaccine in the fall ?Consider shingrix at the pharmacy ?

## 2021-08-31 NOTE — Assessment & Plan Note (Signed)
Hopefully still okay ?Will add metformin '500mg'$  daily if has slipped ?

## 2021-08-31 NOTE — Assessment & Plan Note (Signed)
BP Readings from Last 3 Encounters:  ?08/31/21 110/76  ?04/01/21 102/71  ?08/25/20 126/82  ? ?Good control on losartan '50mg'$  daily ?

## 2021-09-01 ENCOUNTER — Other Ambulatory Visit: Payer: Self-pay | Admitting: Internal Medicine

## 2021-09-01 ENCOUNTER — Other Ambulatory Visit (INDEPENDENT_AMBULATORY_CARE_PROVIDER_SITE_OTHER): Payer: Medicare Other

## 2021-09-01 DIAGNOSIS — Z1211 Encounter for screening for malignant neoplasm of colon: Secondary | ICD-10-CM

## 2021-09-03 LAB — FECAL OCCULT BLOOD, IMMUNOCHEMICAL: Fecal Occult Bld: NEGATIVE

## 2021-09-21 DIAGNOSIS — M47812 Spondylosis without myelopathy or radiculopathy, cervical region: Secondary | ICD-10-CM | POA: Diagnosis not present

## 2021-09-21 DIAGNOSIS — M5412 Radiculopathy, cervical region: Secondary | ICD-10-CM | POA: Diagnosis not present

## 2021-09-23 ENCOUNTER — Other Ambulatory Visit: Payer: Self-pay | Admitting: Student

## 2021-09-23 ENCOUNTER — Other Ambulatory Visit (HOSPITAL_COMMUNITY): Payer: Self-pay | Admitting: Student

## 2021-09-23 DIAGNOSIS — R8271 Bacteriuria: Secondary | ICD-10-CM | POA: Diagnosis not present

## 2021-09-23 DIAGNOSIS — N3000 Acute cystitis without hematuria: Secondary | ICD-10-CM | POA: Diagnosis not present

## 2021-09-23 DIAGNOSIS — M5412 Radiculopathy, cervical region: Secondary | ICD-10-CM

## 2021-09-27 ENCOUNTER — Other Ambulatory Visit (HOSPITAL_COMMUNITY): Payer: Self-pay | Admitting: Student

## 2021-09-27 DIAGNOSIS — M545 Low back pain, unspecified: Secondary | ICD-10-CM

## 2021-10-04 ENCOUNTER — Ambulatory Visit
Admission: RE | Admit: 2021-10-04 | Discharge: 2021-10-04 | Disposition: A | Discharge status: Home / Self Care | Payer: Medicare Other | Source: Ambulatory Visit | Attending: Student | Admitting: Student

## 2021-10-04 DIAGNOSIS — M545 Low back pain, unspecified: Secondary | ICD-10-CM

## 2021-10-04 DIAGNOSIS — M5126 Other intervertebral disc displacement, lumbar region: Secondary | ICD-10-CM | POA: Diagnosis not present

## 2021-10-04 DIAGNOSIS — M5412 Radiculopathy, cervical region: Secondary | ICD-10-CM | POA: Insufficient documentation

## 2021-10-04 DIAGNOSIS — M2578 Osteophyte, vertebrae: Secondary | ICD-10-CM | POA: Diagnosis not present

## 2021-10-04 DIAGNOSIS — R200 Anesthesia of skin: Secondary | ICD-10-CM | POA: Diagnosis not present

## 2021-10-04 DIAGNOSIS — M4807 Spinal stenosis, lumbosacral region: Secondary | ICD-10-CM | POA: Diagnosis not present

## 2021-10-04 DIAGNOSIS — M48061 Spinal stenosis, lumbar region without neurogenic claudication: Secondary | ICD-10-CM | POA: Diagnosis not present

## 2021-10-04 MED ORDER — GADOBUTROL 1 MMOL/ML IV SOLN
10.0000 mL | Freq: Once | INTRAVENOUS | Status: AC | PRN
  Administered 2021-10-04: 10 mL via INTRAVENOUS

## 2021-10-27 DIAGNOSIS — R3911 Hesitancy of micturition: Secondary | ICD-10-CM | POA: Diagnosis not present

## 2021-10-27 DIAGNOSIS — R3914 Feeling of incomplete bladder emptying: Secondary | ICD-10-CM | POA: Diagnosis not present

## 2021-11-05 DIAGNOSIS — H905 Unspecified sensorineural hearing loss: Secondary | ICD-10-CM | POA: Diagnosis not present

## 2021-11-29 DIAGNOSIS — H26492 Other secondary cataract, left eye: Secondary | ICD-10-CM | POA: Diagnosis not present

## 2021-12-14 ENCOUNTER — Other Ambulatory Visit: Payer: Self-pay | Admitting: Neurosurgery

## 2021-12-14 DIAGNOSIS — M48062 Spinal stenosis, lumbar region with neurogenic claudication: Secondary | ICD-10-CM

## 2021-12-14 DIAGNOSIS — M4316 Spondylolisthesis, lumbar region: Secondary | ICD-10-CM | POA: Diagnosis not present

## 2021-12-14 DIAGNOSIS — R200 Anesthesia of skin: Secondary | ICD-10-CM | POA: Diagnosis not present

## 2021-12-28 ENCOUNTER — Inpatient Hospital Stay
Admission: RE | Admit: 2021-12-28 | Discharge: 2021-12-28 | Disposition: A | Discharge status: Home / Self Care | Payer: Medicare Other | Source: Ambulatory Visit | Attending: Neurosurgery | Admitting: Neurosurgery

## 2021-12-28 ENCOUNTER — Inpatient Hospital Stay: Admission: RE | Admit: 2021-12-28 | Payer: Medicare Other | Source: Ambulatory Visit

## 2021-12-28 NOTE — Discharge Instructions (Signed)

## 2022-01-03 ENCOUNTER — Ambulatory Visit
Admission: RE | Admit: 2022-01-03 | Discharge: 2022-01-03 | Disposition: A | Discharge status: Home / Self Care | Payer: Medicare Other | Source: Ambulatory Visit | Attending: Neurosurgery | Admitting: Neurosurgery

## 2022-01-03 DIAGNOSIS — M4316 Spondylolisthesis, lumbar region: Secondary | ICD-10-CM | POA: Diagnosis not present

## 2022-01-03 DIAGNOSIS — M5126 Other intervertebral disc displacement, lumbar region: Secondary | ICD-10-CM | POA: Diagnosis not present

## 2022-01-03 DIAGNOSIS — M48062 Spinal stenosis, lumbar region with neurogenic claudication: Secondary | ICD-10-CM

## 2022-01-03 MED ORDER — IOPAMIDOL (ISOVUE-M 200) INJECTION 41%
15.0000 mL | Freq: Once | INTRAMUSCULAR | Status: AC
  Administered 2022-01-03: 15 mL via INTRATHECAL

## 2022-01-03 NOTE — Discharge Instructions (Signed)

## 2022-01-19 DIAGNOSIS — G5601 Carpal tunnel syndrome, right upper limb: Secondary | ICD-10-CM | POA: Diagnosis not present

## 2022-01-20 DIAGNOSIS — M4316 Spondylolisthesis, lumbar region: Secondary | ICD-10-CM | POA: Diagnosis not present

## 2022-04-15 ENCOUNTER — Other Ambulatory Visit: Payer: Self-pay | Admitting: Internal Medicine

## 2022-04-21 DIAGNOSIS — R3914 Feeling of incomplete bladder emptying: Secondary | ICD-10-CM | POA: Diagnosis not present

## 2022-04-21 DIAGNOSIS — N3000 Acute cystitis without hematuria: Secondary | ICD-10-CM | POA: Diagnosis not present

## 2022-04-21 DIAGNOSIS — R8271 Bacteriuria: Secondary | ICD-10-CM | POA: Diagnosis not present

## 2022-04-26 DIAGNOSIS — M25561 Pain in right knee: Secondary | ICD-10-CM | POA: Diagnosis not present

## 2022-04-26 DIAGNOSIS — M1711 Unilateral primary osteoarthritis, right knee: Secondary | ICD-10-CM | POA: Diagnosis not present

## 2022-04-26 DIAGNOSIS — G8929 Other chronic pain: Secondary | ICD-10-CM | POA: Diagnosis not present

## 2022-05-04 DIAGNOSIS — N3000 Acute cystitis without hematuria: Secondary | ICD-10-CM | POA: Diagnosis not present

## 2022-05-25 DIAGNOSIS — L400 Psoriasis vulgaris: Secondary | ICD-10-CM | POA: Diagnosis not present

## 2022-05-25 DIAGNOSIS — D3617 Benign neoplasm of peripheral nerves and autonomic nervous system of trunk, unspecified: Secondary | ICD-10-CM | POA: Diagnosis not present

## 2022-05-25 DIAGNOSIS — L280 Lichen simplex chronicus: Secondary | ICD-10-CM | POA: Diagnosis not present

## 2022-05-25 DIAGNOSIS — L578 Other skin changes due to chronic exposure to nonionizing radiation: Secondary | ICD-10-CM | POA: Diagnosis not present

## 2022-05-25 DIAGNOSIS — L2089 Other atopic dermatitis: Secondary | ICD-10-CM | POA: Diagnosis not present

## 2022-05-25 DIAGNOSIS — D485 Neoplasm of uncertain behavior of skin: Secondary | ICD-10-CM | POA: Diagnosis not present

## 2022-05-25 DIAGNOSIS — Z859 Personal history of malignant neoplasm, unspecified: Secondary | ICD-10-CM | POA: Diagnosis not present

## 2022-05-25 DIAGNOSIS — Z86018 Personal history of other benign neoplasm: Secondary | ICD-10-CM | POA: Diagnosis not present

## 2022-06-08 DIAGNOSIS — H26492 Other secondary cataract, left eye: Secondary | ICD-10-CM | POA: Diagnosis not present

## 2022-06-08 DIAGNOSIS — H40053 Ocular hypertension, bilateral: Secondary | ICD-10-CM | POA: Diagnosis not present

## 2022-06-08 DIAGNOSIS — M3501 Sicca syndrome with keratoconjunctivitis: Secondary | ICD-10-CM | POA: Diagnosis not present

## 2022-06-08 DIAGNOSIS — H43813 Vitreous degeneration, bilateral: Secondary | ICD-10-CM | POA: Diagnosis not present

## 2022-07-28 ENCOUNTER — Telehealth: Payer: Self-pay | Admitting: *Deleted

## 2022-07-28 NOTE — Patient Instructions (Signed)
Visit Information  Thank you for taking time to visit with me today. Please don't hesitate to contact me if I can be of assistance to you.   Following are the goals we discussed today:   Goals Addressed             This Visit's Progress    care coordination activities       Care Coordination Interventions: Care Management program discussed SDOH screening completed         If you are experiencing a Mental Health or [place] or need someone to talk to, please call 911   The patient verbalized understanding of instructions, educational materials, and care plan provided today and DECLINED offer to receive copy of patient instructions, educational materials, and care plan.   No further follow up required: patient denied having any care coordination needs at this time, patient encouraged to contact this Education officer, museum with any additional needs as they arise   Elliot Gurney, [place] Worker  [facility] Care Management [phone]

## 2022-07-28 NOTE — Patient Outreach (Signed)
  Care Coordination   Initial Visit Note   07/28/2022 Name: MOHMMED DHARIA MRN: [id number] DOB: 02-17-1941  SERGE BAYERL is a 82 y.o. year old male who sees Venia Carbon, MD for primary care. I spoke with  Huey Bienenstock by phone today.  What matters to the patients health and wellness today?  [facility] care management program discussed-patient denied having any care coordination needs at this time   Goals Addressed             This Visit's Progress    care coordination activities       Care Coordination Interventions: Care Management program discussed SDOH screening completed          SDOH assessments and interventions completed:  Yes  SDOH Interventions Today    Flowsheet Row Most Recent Value  SDOH Interventions   Food Insecurity Interventions Intervention Not Indicated  Housing Interventions Intervention Not Indicated        Care Coordination Interventions:  Yes, provided  Interventions Today    Flowsheet Row Most Recent Value  General Interventions   General Interventions Discussed/Reviewed General Interventions Discussed  [place] coordination program discussed-SDOH screening completed]       Follow up plan: No further intervention required.   Encounter Outcome:  Pt. Visit Completed

## 2022-08-15 ENCOUNTER — Other Ambulatory Visit: Payer: Self-pay | Admitting: Internal Medicine

## 2022-08-18 DIAGNOSIS — Z79899 Other long term (current) drug therapy: Secondary | ICD-10-CM | POA: Diagnosis not present

## 2022-08-18 DIAGNOSIS — L400 Psoriasis vulgaris: Secondary | ICD-10-CM | POA: Diagnosis not present

## 2022-09-06 ENCOUNTER — Encounter: Payer: Self-pay | Admitting: Internal Medicine

## 2022-09-06 ENCOUNTER — Ambulatory Visit (INDEPENDENT_AMBULATORY_CARE_PROVIDER_SITE_OTHER): Payer: Medicare Other | Admitting: Internal Medicine

## 2022-09-06 VITALS — BP 130/80 | HR 66 | Temp 97.4°F | Resp 97 | Ht 70.5 in | Wt 245.5 lb

## 2022-09-06 DIAGNOSIS — G959 Disease of spinal cord, unspecified: Secondary | ICD-10-CM | POA: Diagnosis not present

## 2022-09-06 DIAGNOSIS — M48061 Spinal stenosis, lumbar region without neurogenic claudication: Secondary | ICD-10-CM | POA: Diagnosis not present

## 2022-09-06 DIAGNOSIS — I10 Essential (primary) hypertension: Secondary | ICD-10-CM | POA: Diagnosis not present

## 2022-09-06 DIAGNOSIS — Z0000 Encounter for general adult medical examination without abnormal findings: Secondary | ICD-10-CM | POA: Diagnosis not present

## 2022-09-06 DIAGNOSIS — Z1211 Encounter for screening for malignant neoplasm of colon: Secondary | ICD-10-CM | POA: Diagnosis not present

## 2022-09-06 DIAGNOSIS — E785 Hyperlipidemia, unspecified: Secondary | ICD-10-CM

## 2022-09-06 DIAGNOSIS — N401 Enlarged prostate with lower urinary tract symptoms: Secondary | ICD-10-CM | POA: Diagnosis not present

## 2022-09-06 DIAGNOSIS — N138 Other obstructive and reflux uropathy: Secondary | ICD-10-CM

## 2022-09-06 LAB — COMPREHENSIVE METABOLIC PANEL
ALT: 7 U/L (ref 0–53)
AST: 15 U/L (ref 0–37)
Albumin: 4.3 g/dL (ref 3.5–5.2)
Alkaline Phosphatase: 78 U/L (ref 39–117)
BUN: 14 mg/dL (ref 6–23)
CO2: 29 mEq/L (ref 19–32)
Calcium: 9.1 mg/dL (ref 8.4–10.5)
Chloride: 103 mEq/L (ref 96–112)
Creatinine, Ser: 0.82 mg/dL (ref 0.40–1.50)
GFR: 83.16 mL/min (ref >60.00)
Glucose, Bld: 95 mg/dL (ref 70–99)
Potassium: 4.6 mEq/L (ref 3.5–5.1)
Sodium: 139 mEq/L (ref 135–145)
Total Bilirubin: 0.6 mg/dL (ref 0.2–1.2)
Total Protein: 6.8 g/dL (ref 6.0–8.3)

## 2022-09-06 LAB — CBC
HCT: 41.6 % (ref 39.0–52.0)
Hemoglobin: 14 g/dL (ref 13.0–17.0)
MCHC: 33.6 g/dL (ref 30.0–36.0)
MCV: 86.3 fl (ref 78.0–100.0)
Platelets: 262 10*3/uL (ref 150.0–400.0)
RBC: 4.83 Mil/uL (ref 4.22–5.81)
RDW: 14 % (ref 11.5–15.5)
WBC: 7.4 10*3/uL (ref 4.0–10.5)

## 2022-09-06 LAB — LIPID PANEL
Cholesterol: 189 mg/dL (ref 0–200)
HDL: 40.9 mg/dL (ref >39.00)
LDL Cholesterol: 119 mg/dL — ABNORMAL HIGH (ref 0–99)
NonHDL: 147.97
Total CHOL/HDL Ratio: 5
Triglycerides: 144 mg/dL (ref 0.0–149.0)
VLDL: 28.8 mg/dL (ref 0.0–40.0)

## 2022-09-06 LAB — TSH: TSH: 1.69 u[IU]/mL (ref 0.35–5.50)

## 2022-09-06 LAB — VITAMIN B12: Vitamin B-12: 273 pg/mL (ref 211–911)

## 2022-09-06 NOTE — Assessment & Plan Note (Signed)
I have personally reviewed the Medicare Annual Wellness questionnaire and have noted 1. The patient's medical and social history 2. Their use of alcohol, tobacco or illicit drugs 3. Their current medications and supplements 4. The patient's functional ability including ADL's, fall risks, home safety risks and hearing or visual             impairment. 5. Diet and physical activities 6. Evidence for depression or mood disorders  The patients weight, height, BMI and visual acuity have been recorded in the chart I have made referrals, counseling and provided education to the patient based review of the above and I have provided the pt with a written personalized care plan for preventive services.  I have provided you with a copy of your personalized plan for preventive services. Please take the time to review along with your updated medication list.  Last FIT  No PSA due to age Does exercise regularly Recommended updated COVID vaccine--and flu/RSV in the fall

## 2022-09-06 NOTE — Assessment & Plan Note (Signed)
Might be the reason for his leg symptoms Discussed tylenol regularly

## 2022-09-06 NOTE — Progress Notes (Signed)
Subjective:    Patient ID: Evan Giles, male    DOB: January 08, 1941, 82 y.o.   MRN: [id number]  HPI Here for Medicare wellness visit and follow up of chronic health conditions Reviewed advanced directives Reviewed other doctors-- Dr Rober Minion, Dr Arita Miss, Dr Eulas Post, Dr Thomasene Lot, [facility], [facility]--dentist No hospitalizations or surgery in the past year Vision is fine Hearing aides are helpful No alcohol or tobacco Goes to gym regularly---some decrease lately due to knee pain No falls No depression or anhedonia Does housework---occasionally mows (son does most of yard work) No sig memory issues  Gets worn out just walking briefly Legs feel cold and tingly Does okay on exercise bike Bad right knee--has some pain Hands are better than before  Does have sleep problems Will sleep for 4 hours--then intermittently after that Uses tylenol PM---discussed switching to plain tylenol Does enjoy a refreshing nap  No chest pain or SOB No syncope Some dizziness when he first gets up---or twists his head quickly No edema No palpitations Occasional headache  Voids okay on the tamsulosin Nocturia x 2-3 Empties okay  On oral Rx for psoriasis---may be too expensive to continue  Current Outpatient Medications on File Prior to Visit  Medication Sig Dispense Refill   aspirin EC 81 MG tablet Take 81 mg by mouth daily.      atorvastatin (LIPITOR) 20 MG tablet TAKE 1 TABLET BY MOUTH DAILY 90 tablet 0   Calcium Citrate-Vitamin D (CALCIUM + D PO) Take 1 tablet by mouth daily.      Deucravacitinib (SOTYKTU PO) Take by mouth. Take one by mouth daily     diphenhydramine-acetaminophen (TYLENOL PM) 25-500 MG TABS tablet Take 1 tablet by mouth.     losartan (COZAAR) 50 MG tablet TAKE 1 TABLET BY MOUTH DAILY 90 tablet 3   Magnesium 250 MG TABS Take 250 mg by mouth daily.      mometasone (ELOCON) 0.1 % cream Apply 1 Application topically daily.      tamsulosin (FLOMAX) 0.4 MG CAPS capsule Take 0.8 mg by mouth at bedtime.      No current facility-administered medications on file prior to visit.    Allergies  Allergen Reactions   Hydrocodone Itching    Past Medical History:  Diagnosis Date   Diverticulitis    Hard of hearing    Hyperlipidemia    Hypertension    Lumbar stenosis with neurogenic claudication    Osteoarthrosis of knee    Peptic ulcer    years ago   Wears hearing aid in both ears     Past Surgical History:  Procedure Laterality Date   ANTERIOR CERVICAL DECOMP/DISCECTOMY FUSION N/A 11/27/2019   Procedure: Cervical Four-Five Cervical Five-Six Anterior cervical decompression/discectomy/fusion with interbody prosthesis, plate, screws, explore fusion, removal of hardware;  Surgeon: Tressie Stalker, MD;  Location: [facility] OR;  Service: Neurosurgery;  Laterality: N/A;  Cervical Four-Five Cervical Five-Six Anterior cervical decompression/discectomy/fusion with interbody prosthesis, plate, screws, ex   CATARACT EXTRACTION W/ INTRAOCULAR LENS IMPLANT Left 11/2010   CATARACT EXTRACTION W/PHACO Right 10/08/2019   Procedure: CATARACT EXTRACTION PHACO AND INTRAOCULAR LENS PLACEMENT (IOC) RIGHT;  Surgeon: Galen Manila, MD;  Location: [facility];  Service: Ophthalmology;  Laterality: Right;  7.85 0:46.3   CHONDROPLASTY Right 04/10/2019   Procedure: CHONDROPLASTY;  Surgeon: Donato Heinz, MD;  Location: [facility] ORS;  Service: Orthopedics;  Laterality: Right;   colonoscopy  2010   dental implant  08/2015   INGUINAL HERNIA REPAIR Left 05/23/2018  Procedure: LEFT INGUINAL HERNIA REPAIR WITH MESH ERAS PATHWAY;  Surgeon: Griselda Miner, MD;  Location: [facility] ORS;  Service: General;  Laterality: Left;   JOINT REPLACEMENT Left 06/2011   Left knee---Dr Hooten   KNEE ARTHROSCOPY Right 09/07/2015   Procedure: ARTHROSCOPY KNEE, PARTIAL MEDIAL AND LATERAL MENISECTOMY, chondroplasty of patella femoral.;  Surgeon: Donato Heinz, MD;   Location: [facility] ORS;  Service: Orthopedics;  Laterality: Right;   KNEE ARTHROSCOPY Right 09/25/2017   Procedure: ARTHROSCOPY KNEE;  Surgeon: Donato Heinz, MD;  Location: [facility] ORS;  Service: Orthopedics;  Laterality: Right;   KNEE ARTHROSCOPY WITH MEDIAL MENISECTOMY Right 09/25/2017   Procedure: KNEE ARTHROSCOPY WITH MEDIAL MENISECTOMY;  Surgeon: Donato Heinz, MD;  Location: [facility] ORS;  Service: Orthopedics;  Laterality: Right;   KNEE ARTHROSCOPY WITH MEDIAL MENISECTOMY Right 04/10/2019   Procedure: KNEE ARTHROSCOPY WITH PARTIAL MEDIAL MENISECTOMY;  Surgeon: Donato Heinz, MD;  Location: [facility] ORS;  Service: Orthopedics;  Laterality: Right;   LUMBAR LAMINECTOMY/DECOMPRESSION MICRODISCECTOMY N/A 06/20/2018   Procedure: Lumbar 3-4, Lumbar 4-5 Laminectomy/Foraminotomy;  Surgeon: Tressie Stalker, MD;  Location: [facility] OR;  Service: Neurosurgery;  Laterality: N/A;  Lumbar 3-4, Lumbar 4-5 Laminectomy/Foraminotomy   MENISCUS DEBRIDEMENT Left 05/2010   Dr Dorise Bullion   RETINAL LASER PROCEDURE Left 07/2010   Dr Monte Fantasia   SHOULDER ARTHROSCOPY WITH SUBACROMIAL DECOMPRESSION, ROTATOR CUFF REPAIR AND BICEP TENDON REPAIR Left 04/01/2021   Procedure: SHOULDER ARTHROSCOPY WITH DEBRIDEMENT, DECOMPRESSION, MINI OPEN ROTATOR CUFF REPAIR.;  Surgeon: Christena Flake, MD;  Location: [facility] ORS;  Service: Orthopedics;  Laterality: Left;   spinal stenosis surgery  2018, 01/10/18, 06/20/18   Metal plate   TONSILLECTOMY AND ADENOIDECTOMY  1966    Family History  Problem Relation Age of Onset   Heart disease Father    Stroke Father        not certain about this   Cancer Sister        unknown   Diabetes Maternal Grandmother    Obesity Sister     Social History   Socioeconomic History   Marital status: Married    Spouse name: Darl Pikes   Number of children: 2   Years of education: 15   Highest education level: Not on file  Occupational History   Occupation: Theatre stage manager    Comment: mostly Medicare  advantage  Tobacco Use   Smoking status: Never    Passive exposure: Past   Smokeless tobacco: Never  Vaping Use   Vaping Use: Never used  Substance and Sexual Activity   Alcohol use: No    Alcohol/week: 0.0 standard drinks of alcohol   Drug use: No   Sexual activity: Not on file  Other Topics Concern   Not on file  Social History Narrative   I son and 1 daughter   Has living will   Wife is health care POA --- alternate would be son   Would accept resuscitation---but no prolonged ventilation   Probably would accept feeding tube--at least for a limited time   Lives with wife   Social Determinants of Health   Financial Resource Strain: Not on file  Food Insecurity: No Food Insecurity (07/28/2022)   Hunger Vital Sign    Worried About Running Out of Food in the Last Year: Never true    Ran Out of Food in the Last Year: Never true  Transportation Needs: Not on file  Physical Activity: Not on file  Stress: Not on file  Social Connections: Not on file  Intimate Partner Violence: Not on file   Review of Systems Appetite is good Weight is stable Wears seat belt Teeth are okay--keeps up with dentist No heartburn or dysphagia Bowels move fine--no blood No suspicious skin lesions    Objective:   Physical Exam Constitutional:      Appearance: Normal appearance.  HENT:     Mouth/Throat:     Pharynx: No oropharyngeal exudate or posterior oropharyngeal erythema.  Eyes:     Conjunctiva/sclera: Conjunctivae normal.     Pupils: Pupils are equal, round, and reactive to light.  Cardiovascular:     Rate and Rhythm: Normal rate and regular rhythm.     Pulses: Normal pulses.     Heart sounds: No murmur heard.    No gallop.  Pulmonary:     Effort: Pulmonary effort is normal.     Breath sounds: Normal breath sounds. No wheezing or rales.  Abdominal:     Palpations: Abdomen is soft.     Tenderness: There is no abdominal tenderness.  Musculoskeletal:     Cervical back: Neck supple.      Right lower leg: No edema.     Left lower leg: No edema.  Lymphadenopathy:     Cervical: No cervical adenopathy.  Skin:    Findings: No lesion or rash.  Neurological:     General: No focal deficit present.     Mental Status: He is alert and oriented to person, place, and time.     Comments: Word naming 11/1 minute Recall 3/3  Psychiatric:        Mood and Affect: Mood normal.        Behavior: Behavior normal.            Assessment & Plan:

## 2022-09-06 NOTE — Assessment & Plan Note (Signed)
Ongoing leg symptoms (could also be related to lumbar disease) but hands are better Is going to see Dr Lovell Sheehan and can review

## 2022-09-06 NOTE — Assessment & Plan Note (Signed)
BP Readings from Last 3 Encounters:  09/06/22 130/80  01/03/22 138/65  08/31/21 110/76   Controlled on losartan 50mg  daily Will check labs

## 2022-09-06 NOTE — Assessment & Plan Note (Signed)
Controlled with tamsulosin 0.4mg daily 

## 2022-09-06 NOTE — Assessment & Plan Note (Signed)
No problems with primary prevention with atorvastatin 20 Check labs

## 2022-09-07 ENCOUNTER — Other Ambulatory Visit: Payer: Self-pay | Admitting: Radiology

## 2022-09-07 DIAGNOSIS — Z1211 Encounter for screening for malignant neoplasm of colon: Secondary | ICD-10-CM

## 2022-09-09 LAB — FECAL OCCULT BLOOD, IMMUNOCHEMICAL: Fecal Occult Bld: NEGATIVE

## 2022-09-15 DIAGNOSIS — M5416 Radiculopathy, lumbar region: Secondary | ICD-10-CM | POA: Diagnosis not present

## 2022-09-15 DIAGNOSIS — M1711 Unilateral primary osteoarthritis, right knee: Secondary | ICD-10-CM | POA: Diagnosis not present

## 2022-09-15 DIAGNOSIS — Z96652 Presence of left artificial knee joint: Secondary | ICD-10-CM | POA: Diagnosis not present

## 2022-09-28 DIAGNOSIS — M5136 Other intervertebral disc degeneration, lumbar region: Secondary | ICD-10-CM | POA: Diagnosis not present

## 2022-09-28 DIAGNOSIS — M9903 Segmental and somatic dysfunction of lumbar region: Secondary | ICD-10-CM | POA: Diagnosis not present

## 2022-09-28 DIAGNOSIS — M9902 Segmental and somatic dysfunction of thoracic region: Secondary | ICD-10-CM | POA: Diagnosis not present

## 2022-09-28 DIAGNOSIS — M9901 Segmental and somatic dysfunction of cervical region: Secondary | ICD-10-CM | POA: Diagnosis not present

## 2022-09-30 DIAGNOSIS — M9901 Segmental and somatic dysfunction of cervical region: Secondary | ICD-10-CM | POA: Diagnosis not present

## 2022-09-30 DIAGNOSIS — M9902 Segmental and somatic dysfunction of thoracic region: Secondary | ICD-10-CM | POA: Diagnosis not present

## 2022-09-30 DIAGNOSIS — M9903 Segmental and somatic dysfunction of lumbar region: Secondary | ICD-10-CM | POA: Diagnosis not present

## 2022-09-30 DIAGNOSIS — M5136 Other intervertebral disc degeneration, lumbar region: Secondary | ICD-10-CM | POA: Diagnosis not present

## 2022-10-05 ENCOUNTER — Other Ambulatory Visit: Payer: Self-pay | Admitting: Orthopedic Surgery

## 2022-10-05 DIAGNOSIS — M5416 Radiculopathy, lumbar region: Secondary | ICD-10-CM

## 2022-10-06 DIAGNOSIS — M5136 Other intervertebral disc degeneration, lumbar region: Secondary | ICD-10-CM | POA: Diagnosis not present

## 2022-10-06 DIAGNOSIS — M9902 Segmental and somatic dysfunction of thoracic region: Secondary | ICD-10-CM | POA: Diagnosis not present

## 2022-10-06 DIAGNOSIS — M9901 Segmental and somatic dysfunction of cervical region: Secondary | ICD-10-CM | POA: Diagnosis not present

## 2022-10-06 DIAGNOSIS — M9903 Segmental and somatic dysfunction of lumbar region: Secondary | ICD-10-CM | POA: Diagnosis not present

## 2022-10-08 ENCOUNTER — Ambulatory Visit
Admission: RE | Admit: 2022-10-08 | Discharge: 2022-10-08 | Disposition: A | Discharge status: Home / Self Care | Payer: Medicare Other | Source: Ambulatory Visit | Attending: Orthopedic Surgery | Admitting: Orthopedic Surgery

## 2022-10-08 DIAGNOSIS — M48061 Spinal stenosis, lumbar region without neurogenic claudication: Secondary | ICD-10-CM | POA: Diagnosis not present

## 2022-10-08 DIAGNOSIS — M5416 Radiculopathy, lumbar region: Secondary | ICD-10-CM

## 2022-10-08 DIAGNOSIS — M5430 Sciatica, unspecified side: Secondary | ICD-10-CM | POA: Diagnosis not present

## 2022-10-10 ENCOUNTER — Other Ambulatory Visit: Payer: Self-pay | Admitting: Orthopedic Surgery

## 2022-10-10 DIAGNOSIS — M9901 Segmental and somatic dysfunction of cervical region: Secondary | ICD-10-CM | POA: Diagnosis not present

## 2022-10-10 DIAGNOSIS — M5416 Radiculopathy, lumbar region: Secondary | ICD-10-CM

## 2022-10-10 DIAGNOSIS — M5136 Other intervertebral disc degeneration, lumbar region: Secondary | ICD-10-CM | POA: Diagnosis not present

## 2022-10-10 DIAGNOSIS — M9902 Segmental and somatic dysfunction of thoracic region: Secondary | ICD-10-CM | POA: Diagnosis not present

## 2022-10-10 DIAGNOSIS — M9903 Segmental and somatic dysfunction of lumbar region: Secondary | ICD-10-CM | POA: Diagnosis not present

## 2022-10-12 DIAGNOSIS — M9903 Segmental and somatic dysfunction of lumbar region: Secondary | ICD-10-CM | POA: Diagnosis not present

## 2022-10-12 DIAGNOSIS — M5136 Other intervertebral disc degeneration, lumbar region: Secondary | ICD-10-CM | POA: Diagnosis not present

## 2022-10-12 DIAGNOSIS — M9902 Segmental and somatic dysfunction of thoracic region: Secondary | ICD-10-CM | POA: Diagnosis not present

## 2022-10-12 DIAGNOSIS — M9901 Segmental and somatic dysfunction of cervical region: Secondary | ICD-10-CM | POA: Diagnosis not present

## 2022-10-13 DIAGNOSIS — M9903 Segmental and somatic dysfunction of lumbar region: Secondary | ICD-10-CM | POA: Diagnosis not present

## 2022-10-13 DIAGNOSIS — M9901 Segmental and somatic dysfunction of cervical region: Secondary | ICD-10-CM | POA: Diagnosis not present

## 2022-10-13 DIAGNOSIS — M9902 Segmental and somatic dysfunction of thoracic region: Secondary | ICD-10-CM | POA: Diagnosis not present

## 2022-10-13 DIAGNOSIS — M5136 Other intervertebral disc degeneration, lumbar region: Secondary | ICD-10-CM | POA: Diagnosis not present

## 2022-10-14 DIAGNOSIS — M25561 Pain in right knee: Secondary | ICD-10-CM | POA: Diagnosis not present

## 2022-10-14 DIAGNOSIS — M48061 Spinal stenosis, lumbar region without neurogenic claudication: Secondary | ICD-10-CM | POA: Diagnosis not present

## 2022-10-19 DIAGNOSIS — M9903 Segmental and somatic dysfunction of lumbar region: Secondary | ICD-10-CM | POA: Diagnosis not present

## 2022-10-19 DIAGNOSIS — M5136 Other intervertebral disc degeneration, lumbar region: Secondary | ICD-10-CM | POA: Diagnosis not present

## 2022-10-19 DIAGNOSIS — M9902 Segmental and somatic dysfunction of thoracic region: Secondary | ICD-10-CM | POA: Diagnosis not present

## 2022-10-19 DIAGNOSIS — M9901 Segmental and somatic dysfunction of cervical region: Secondary | ICD-10-CM | POA: Diagnosis not present

## 2022-10-21 DIAGNOSIS — I739 Peripheral vascular disease, unspecified: Secondary | ICD-10-CM | POA: Diagnosis not present

## 2022-10-21 DIAGNOSIS — M5416 Radiculopathy, lumbar region: Secondary | ICD-10-CM | POA: Diagnosis not present

## 2022-10-24 DIAGNOSIS — M9901 Segmental and somatic dysfunction of cervical region: Secondary | ICD-10-CM | POA: Diagnosis not present

## 2022-10-24 DIAGNOSIS — M5136 Other intervertebral disc degeneration, lumbar region: Secondary | ICD-10-CM | POA: Diagnosis not present

## 2022-10-24 DIAGNOSIS — M9903 Segmental and somatic dysfunction of lumbar region: Secondary | ICD-10-CM | POA: Diagnosis not present

## 2022-10-24 DIAGNOSIS — M9902 Segmental and somatic dysfunction of thoracic region: Secondary | ICD-10-CM | POA: Diagnosis not present

## 2022-10-26 DIAGNOSIS — M9902 Segmental and somatic dysfunction of thoracic region: Secondary | ICD-10-CM | POA: Diagnosis not present

## 2022-10-26 DIAGNOSIS — M5136 Other intervertebral disc degeneration, lumbar region: Secondary | ICD-10-CM | POA: Diagnosis not present

## 2022-10-26 DIAGNOSIS — M9901 Segmental and somatic dysfunction of cervical region: Secondary | ICD-10-CM | POA: Diagnosis not present

## 2022-10-26 DIAGNOSIS — M9903 Segmental and somatic dysfunction of lumbar region: Secondary | ICD-10-CM | POA: Diagnosis not present

## 2022-10-31 DIAGNOSIS — M5136 Other intervertebral disc degeneration, lumbar region: Secondary | ICD-10-CM | POA: Diagnosis not present

## 2022-10-31 DIAGNOSIS — M9902 Segmental and somatic dysfunction of thoracic region: Secondary | ICD-10-CM | POA: Diagnosis not present

## 2022-10-31 DIAGNOSIS — M9903 Segmental and somatic dysfunction of lumbar region: Secondary | ICD-10-CM | POA: Diagnosis not present

## 2022-10-31 DIAGNOSIS — M9901 Segmental and somatic dysfunction of cervical region: Secondary | ICD-10-CM | POA: Diagnosis not present

## 2022-11-02 DIAGNOSIS — R3914 Feeling of incomplete bladder emptying: Secondary | ICD-10-CM | POA: Diagnosis not present

## 2022-11-02 DIAGNOSIS — N3000 Acute cystitis without hematuria: Secondary | ICD-10-CM | POA: Diagnosis not present

## 2022-11-02 DIAGNOSIS — N3020 Other chronic cystitis without hematuria: Secondary | ICD-10-CM | POA: Diagnosis not present

## 2022-11-07 DIAGNOSIS — M9902 Segmental and somatic dysfunction of thoracic region: Secondary | ICD-10-CM | POA: Diagnosis not present

## 2022-11-07 DIAGNOSIS — M9903 Segmental and somatic dysfunction of lumbar region: Secondary | ICD-10-CM | POA: Diagnosis not present

## 2022-11-07 DIAGNOSIS — M9901 Segmental and somatic dysfunction of cervical region: Secondary | ICD-10-CM | POA: Diagnosis not present

## 2022-11-07 DIAGNOSIS — M5136 Other intervertebral disc degeneration, lumbar region: Secondary | ICD-10-CM | POA: Diagnosis not present

## 2022-11-14 DIAGNOSIS — M5136 Other intervertebral disc degeneration, lumbar region: Secondary | ICD-10-CM | POA: Diagnosis not present

## 2022-11-14 DIAGNOSIS — M9903 Segmental and somatic dysfunction of lumbar region: Secondary | ICD-10-CM | POA: Diagnosis not present

## 2022-11-14 DIAGNOSIS — M9902 Segmental and somatic dysfunction of thoracic region: Secondary | ICD-10-CM | POA: Diagnosis not present

## 2022-11-14 DIAGNOSIS — M9901 Segmental and somatic dysfunction of cervical region: Secondary | ICD-10-CM | POA: Diagnosis not present

## 2022-11-15 ENCOUNTER — Other Ambulatory Visit: Payer: Self-pay | Admitting: Internal Medicine

## 2022-11-21 DIAGNOSIS — M9902 Segmental and somatic dysfunction of thoracic region: Secondary | ICD-10-CM | POA: Diagnosis not present

## 2022-11-21 DIAGNOSIS — M5136 Other intervertebral disc degeneration, lumbar region: Secondary | ICD-10-CM | POA: Diagnosis not present

## 2022-11-21 DIAGNOSIS — B372 Candidiasis of skin and nail: Secondary | ICD-10-CM | POA: Diagnosis not present

## 2022-11-21 DIAGNOSIS — L400 Psoriasis vulgaris: Secondary | ICD-10-CM | POA: Diagnosis not present

## 2022-11-21 DIAGNOSIS — M9903 Segmental and somatic dysfunction of lumbar region: Secondary | ICD-10-CM | POA: Diagnosis not present

## 2022-11-21 DIAGNOSIS — M9901 Segmental and somatic dysfunction of cervical region: Secondary | ICD-10-CM | POA: Diagnosis not present

## 2022-11-25 DIAGNOSIS — M5416 Radiculopathy, lumbar region: Secondary | ICD-10-CM | POA: Diagnosis not present

## 2022-12-06 DIAGNOSIS — M5442 Lumbago with sciatica, left side: Secondary | ICD-10-CM | POA: Diagnosis not present

## 2022-12-06 DIAGNOSIS — M5416 Radiculopathy, lumbar region: Secondary | ICD-10-CM | POA: Diagnosis not present

## 2022-12-06 DIAGNOSIS — R269 Unspecified abnormalities of gait and mobility: Secondary | ICD-10-CM | POA: Diagnosis not present

## 2022-12-06 DIAGNOSIS — M542 Cervicalgia: Secondary | ICD-10-CM | POA: Diagnosis not present

## 2022-12-06 DIAGNOSIS — G8929 Other chronic pain: Secondary | ICD-10-CM | POA: Diagnosis not present

## 2022-12-06 DIAGNOSIS — M5412 Radiculopathy, cervical region: Secondary | ICD-10-CM | POA: Diagnosis not present

## 2022-12-06 DIAGNOSIS — M5441 Lumbago with sciatica, right side: Secondary | ICD-10-CM | POA: Diagnosis not present

## 2022-12-08 ENCOUNTER — Other Ambulatory Visit: Payer: Self-pay | Admitting: Physical Medicine & Rehabilitation

## 2022-12-08 DIAGNOSIS — M5412 Radiculopathy, cervical region: Secondary | ICD-10-CM

## 2023-01-03 NOTE — Discharge Instructions (Signed)

## 2023-01-04 ENCOUNTER — Ambulatory Visit
Admission: RE | Admit: 2023-01-04 | Discharge: 2023-01-04 | Disposition: A | Discharge status: Home / Self Care | Payer: Medicare Other | Source: Ambulatory Visit | Attending: Physical Medicine & Rehabilitation | Admitting: Physical Medicine & Rehabilitation

## 2023-01-04 DIAGNOSIS — M47812 Spondylosis without myelopathy or radiculopathy, cervical region: Secondary | ICD-10-CM | POA: Diagnosis not present

## 2023-01-04 DIAGNOSIS — Z981 Arthrodesis status: Secondary | ICD-10-CM | POA: Diagnosis not present

## 2023-01-04 DIAGNOSIS — M5412 Radiculopathy, cervical region: Secondary | ICD-10-CM

## 2023-01-04 DIAGNOSIS — M4802 Spinal stenosis, cervical region: Secondary | ICD-10-CM | POA: Diagnosis not present

## 2023-01-04 MED ORDER — TRIAMCINOLONE ACETONIDE 40 MG/ML IJ SUSP (RADIOLOGY)
60.0000 mg | Freq: Once | INTRAMUSCULAR | Status: AC
  Administered 2023-01-04: 60 mg via EPIDURAL

## 2023-01-04 MED ORDER — IOPAMIDOL (ISOVUE-M 300) INJECTION 61%
1.0000 mL | Freq: Once | INTRAMUSCULAR | Status: AC | PRN
  Administered 2023-01-04: 1 mL via EPIDURAL

## 2023-01-13 DIAGNOSIS — M542 Cervicalgia: Secondary | ICD-10-CM | POA: Diagnosis not present

## 2023-01-13 DIAGNOSIS — R2689 Other abnormalities of gait and mobility: Secondary | ICD-10-CM | POA: Diagnosis not present

## 2023-01-13 DIAGNOSIS — R7309 Other abnormal glucose: Secondary | ICD-10-CM | POA: Diagnosis not present

## 2023-01-13 DIAGNOSIS — R262 Difficulty in walking, not elsewhere classified: Secondary | ICD-10-CM | POA: Diagnosis not present

## 2023-01-13 DIAGNOSIS — G629 Polyneuropathy, unspecified: Secondary | ICD-10-CM | POA: Diagnosis not present

## 2023-01-13 DIAGNOSIS — R42 Dizziness and giddiness: Secondary | ICD-10-CM | POA: Diagnosis not present

## 2023-01-17 ENCOUNTER — Other Ambulatory Visit: Payer: Self-pay | Admitting: Physician Assistant

## 2023-01-17 DIAGNOSIS — R42 Dizziness and giddiness: Secondary | ICD-10-CM

## 2023-01-17 DIAGNOSIS — R2689 Other abnormalities of gait and mobility: Secondary | ICD-10-CM

## 2023-01-18 DIAGNOSIS — M5442 Lumbago with sciatica, left side: Secondary | ICD-10-CM | POA: Diagnosis not present

## 2023-01-18 DIAGNOSIS — M5441 Lumbago with sciatica, right side: Secondary | ICD-10-CM | POA: Diagnosis not present

## 2023-01-18 DIAGNOSIS — M542 Cervicalgia: Secondary | ICD-10-CM | POA: Diagnosis not present

## 2023-01-18 DIAGNOSIS — M5416 Radiculopathy, lumbar region: Secondary | ICD-10-CM | POA: Diagnosis not present

## 2023-01-18 DIAGNOSIS — M5412 Radiculopathy, cervical region: Secondary | ICD-10-CM | POA: Diagnosis not present

## 2023-01-18 DIAGNOSIS — G8929 Other chronic pain: Secondary | ICD-10-CM | POA: Diagnosis not present

## 2023-01-19 ENCOUNTER — Ambulatory Visit
Admission: RE | Admit: 2023-01-19 | Discharge: 2023-01-19 | Disposition: A | Discharge status: Home / Self Care | Payer: Medicare Other | Source: Ambulatory Visit | Attending: Physician Assistant | Admitting: Physician Assistant

## 2023-01-19 DIAGNOSIS — R2689 Other abnormalities of gait and mobility: Secondary | ICD-10-CM | POA: Diagnosis not present

## 2023-01-19 DIAGNOSIS — R42 Dizziness and giddiness: Secondary | ICD-10-CM | POA: Insufficient documentation

## 2023-01-19 DIAGNOSIS — I6782 Cerebral ischemia: Secondary | ICD-10-CM | POA: Diagnosis not present

## 2023-02-06 ENCOUNTER — Other Ambulatory Visit (INDEPENDENT_AMBULATORY_CARE_PROVIDER_SITE_OTHER): Payer: Self-pay | Admitting: Nurse Practitioner

## 2023-02-06 DIAGNOSIS — I739 Peripheral vascular disease, unspecified: Secondary | ICD-10-CM

## 2023-02-08 DIAGNOSIS — M79606 Pain in leg, unspecified: Secondary | ICD-10-CM | POA: Insufficient documentation

## 2023-02-08 NOTE — Progress Notes (Unsigned)
MRN : [id number]  Evan Giles is a 82 y.o. (1940/11/25) male who presents with chief complaint of check circulation.  History of Present Illness:  The patient is seen for evaluation of discomfort of both lower extremities. Patient notes the discomfort is like an overwhelming tiredness when he is walking.  It is somewhat consistent day to day occurring on most days.  He does have an extensive history of back troubles and follows with Dr. Lovell Sheehan in [place].  He last saw Dr. Lovell Sheehan in January and feels that his symptoms are about the same.  He does note that he can ride his stationary bike for 30+ minutes at a time without these symptoms occurring.  The patient denies rest pain or dangling of an extremity off the side of the bed during the night for relief. No open wounds or sores at this time. No history of DVT or phlebitis. No prior vascular interventions or surgeries.  ABIs obtained today are normal bilaterally.  No outpatient medications have been marked as taking for the 02/09/23 encounter (Appointment) with [name], [name], MD.    Past Medical History:  Diagnosis Date   Diverticulitis    Hard of hearing    Hyperlipidemia    Hypertension    Lumbar stenosis with neurogenic claudication    Osteoarthrosis of knee    Peptic ulcer    years ago   Wears hearing aid in both ears     Past Surgical History:  Procedure Laterality Date   ANTERIOR CERVICAL DECOMP/DISCECTOMY FUSION N/A 11/27/2019   Procedure: Cervical Four-Five Cervical Five-Six Anterior cervical decompression/discectomy/fusion with interbody prosthesis, plate, screws, explore fusion, removal of hardware;  Surgeon: Tressie Stalker, MD;  Location: [facility] OR;  Service: Neurosurgery;  Laterality: N/A;  Cervical Four-Five Cervical Five-Six Anterior cervical decompression/discectomy/fusion with interbody prosthesis, plate, screws, ex   CATARACT  EXTRACTION W/ INTRAOCULAR LENS IMPLANT Left 11/2010   CATARACT EXTRACTION W/PHACO Right 10/08/2019   Procedure: CATARACT EXTRACTION PHACO AND INTRAOCULAR LENS PLACEMENT (IOC) RIGHT;  Surgeon: Galen Manila, MD;  Location: [facility];  Service: Ophthalmology;  Laterality: Right;  7.85 0:46.3   CHONDROPLASTY Right 04/10/2019   Procedure: CHONDROPLASTY;  Surgeon: Donato Heinz, MD;  Location: [facility] ORS;  Service: Orthopedics;  Laterality: Right;   colonoscopy  2010   dental implant  08/2015   INGUINAL HERNIA REPAIR Left 05/23/2018   Procedure: LEFT INGUINAL HERNIA REPAIR WITH MESH ERAS PATHWAY;  Surgeon: Griselda Miner, MD;  Location: [facility] ORS;  Service: General;  Laterality: Left;   JOINT REPLACEMENT Left 06/2011   Left knee---Dr Hooten   KNEE ARTHROSCOPY Right 09/07/2015   Procedure: ARTHROSCOPY KNEE, PARTIAL MEDIAL AND LATERAL MENISECTOMY, chondroplasty of patella femoral.;  Surgeon: Donato Heinz, MD;  Location: [facility] ORS;  Service: Orthopedics;  Laterality: Right;   KNEE ARTHROSCOPY Right 09/25/2017   Procedure: ARTHROSCOPY KNEE;  Surgeon: Donato Heinz, MD;  Location: [facility] ORS;  Service: Orthopedics;  Laterality: Right;   KNEE ARTHROSCOPY WITH MEDIAL MENISECTOMY Right 09/25/2017   Procedure: KNEE ARTHROSCOPY WITH MEDIAL MENISECTOMY;  Surgeon: Donato Heinz, MD;  Location:  [facility] ORS;  Service: Orthopedics;  Laterality: Right;   KNEE ARTHROSCOPY WITH MEDIAL MENISECTOMY Right 04/10/2019   Procedure: KNEE ARTHROSCOPY WITH PARTIAL MEDIAL MENISECTOMY;  Surgeon: Donato Heinz, MD;  Location: [facility] ORS;  Service: Orthopedics;  Laterality: Right;   LUMBAR LAMINECTOMY/DECOMPRESSION MICRODISCECTOMY N/A 06/20/2018   Procedure: Lumbar 3-4, Lumbar 4-5 Laminectomy/Foraminotomy;  Surgeon: Tressie Stalker, MD;  Location: [facility] OR;  Service: Neurosurgery;  Laterality: N/A;  Lumbar 3-4, Lumbar 4-5 Laminectomy/Foraminotomy   MENISCUS DEBRIDEMENT Left 05/2010   Dr Dorise Bullion   RETINAL LASER  PROCEDURE Left 07/2010   Dr Monte Fantasia   SHOULDER ARTHROSCOPY WITH SUBACROMIAL DECOMPRESSION, ROTATOR CUFF REPAIR AND BICEP TENDON REPAIR Left 04/01/2021   Procedure: SHOULDER ARTHROSCOPY WITH DEBRIDEMENT, DECOMPRESSION, MINI OPEN ROTATOR CUFF REPAIR.;  Surgeon: Christena Flake, MD;  Location: [facility] ORS;  Service: Orthopedics;  Laterality: Left;   spinal stenosis surgery  2018, 01/10/18, 06/20/18   Metal plate   TONSILLECTOMY AND ADENOIDECTOMY  1966    Social History Social History   Tobacco Use   Smoking status: Never    Passive exposure: Past   Smokeless tobacco: Never  Vaping Use   Vaping status: Never Used  Substance Use Topics   Alcohol use: No    Alcohol/week: 0.0 standard drinks of alcohol   Drug use: No    Family History Family History  Problem Relation Age of Onset   Heart disease Father    Stroke Father        not certain about this   Cancer Sister        unknown   Diabetes Maternal Grandmother    Obesity Sister     Allergies  Allergen Reactions   Hydrocodone Itching     REVIEW OF SYSTEMS (Negative unless checked)  Constitutional: [] Weight loss  [] Fever  [] Chills Cardiac: [] Chest pain   [] Chest pressure   [] Palpitations   [] Shortness of breath when laying flat   [] Shortness of breath with exertion. Vascular:  [x] Pain in legs with walking   [] Pain in legs at rest  [] History of DVT   [] Phlebitis   [] Swelling in legs   [] Varicose veins   [] Non-healing ulcers Pulmonary:   [] Uses home oxygen   [] Productive cough   [] Hemoptysis   [] Wheeze  [] COPD   [] Asthma Neurologic:  [] Dizziness   [] Seizures   [] History of stroke   [] History of TIA  [] Aphasia   [] Vissual changes   [] Weakness or numbness in arm   [] Weakness or numbness in leg Musculoskeletal:   [] Joint swelling   [] Joint pain   [] Low back pain Hematologic:  [] Easy bruising  [] Easy bleeding   [] Hypercoagulable state   [] Anemic Gastrointestinal:  [] Diarrhea   [] Vomiting  [] Gastroesophageal reflux/heartburn    [] Difficulty swallowing. Genitourinary:  [] Chronic kidney disease   [] Difficult urination  [] Frequent urination   [] Blood in urine Skin:  [] Rashes   [] Ulcers  Psychological:  [] History of anxiety   []  History of major depression.  Physical Examination  There were no vitals filed for this visit. There is no height or weight on file to calculate BMI. Gen: WD/WN, NAD Head: [place]/AT, No temporalis wasting.  Ear/Nose/Throat: Hearing grossly intact, nares w/o erythema or drainage Eyes: PER, EOMI, sclera nonicteric.  Neck: Supple, no masses.  No bruit or JVD.  Pulmonary:  Good air movement, no audible wheezing, no use of accessory muscles.  Cardiac: RRR, normal S1, S2, no Murmurs. Vascular:  mild trophic changes, no open wounds Vessel Right Left  Radial Palpable Palpable  PT Palpable  Palpable  DP  Palpable  Palpable  Gastrointestinal: soft, non-distended. No guarding/no peritoneal signs.  Musculoskeletal: M/S 5/5 throughout.  No visible deformity.  Neurologic: CN 2-12 intact. Pain and light touch intact in extremities.  Symmetrical.  Speech is fluent. Motor exam as listed above. Psychiatric: Judgment intact, Mood & affect appropriate for pt's clinical situation. Dermatologic: No rashes or ulcers noted.  No changes consistent with cellulitis.   CBC Lab Results  Component Value Date   WBC 7.4 09/06/2022   HGB 14.0 09/06/2022   HCT 41.6 09/06/2022   MCV 86.3 09/06/2022   PLT 262.0 09/06/2022    BMET    Component Value Date/Time   NA 139 09/06/2022 0948   NA 137 07/08/2011 0336   K 4.6 09/06/2022 0948   K 4.0 07/08/2011 0336   CL 103 09/06/2022 0948   CL 99 07/08/2011 0336   CO2 29 09/06/2022 0948   CO2 28 07/08/2011 0336   GLUCOSE 95 09/06/2022 0948   GLUCOSE 116 (H) 07/08/2011 0336   BUN 14 09/06/2022 0948   BUN 12 07/08/2011 0336   CREATININE 0.82 09/06/2022 0948   CREATININE 0.85 07/08/2011 0336   CALCIUM 9.1 09/06/2022 0948   CALCIUM 8.5 07/08/2011 0336   GFRNONAA >60  03/29/2021 1343   GFRNONAA >60 07/08/2011 0336   GFRAA >60 11/20/2019 0823   GFRAA >60 07/08/2011 0336   CrCl cannot be calculated (Patient's most recent lab result is older than the maximum 21 days allowed.).  COAG Lab Results  Component Value Date   INR 0.9 06/20/2011    Radiology MR BRAIN WO CONTRAST  Result Date: 02/08/2023 CLINICAL DATA:  Dizziness. EXAM: MRI HEAD WITHOUT CONTRAST TECHNIQUE: Multiplanar, multiecho pulse sequences of the brain and surrounding structures were obtained without intravenous contrast. COMPARISON:  Head MRI 06/06/2018 FINDINGS: Brain: There is no evidence of an acute infarct, intracranial hemorrhage, mass, midline shift, or extra-axial fluid collection. T2 hyperintensities in the cerebral white matter have slightly progressed from the prior MRI and are nonspecific but compatible with minimal chronic small vessel ischemic disease. Mild cerebral atrophy is within normal limits for age. No focal cerebellar insult is identified. Vascular: Major intracranial vascular flow voids are preserved. Skull and upper cervical spine: Unremarkable bone marrow signal. Sinuses/Orbits: Bilateral cataract extraction. No significant inflammatory disease in the paranasal sinuses. No significant mastoid fluid. Other: None. IMPRESSION: 1. No acute intracranial abnormality. 2. Minimal chronic small vessel ischemic disease. Electronically Signed   By: Sebastian Ache M.D.   On: 02/08/2023 19:37     Assessment/Plan 1. Degenerative lumbar spinal stenosis Recommend:  The patient has atypical pain symptoms for vascular disease and on exam I do not find evidence of vascular pathology that would explain the patient's symptoms.  Noninvasive studies do not identify significant vascular problems  I suspect the patient is c/o pseudoclaudication.  Patient should have an evaluation of the LS spine which I defer to the primary service, pain management or the Spine service.  The patient should  continue walking and begin a more formal exercise program. The patient should continue his antiplatelet therapy and aggressive treatment of the lipid abnormalities.  Patient will follow-up with me on a PRN basis.  2. Pain in both lower extremities Recommend:  The patient has atypical pain symptoms for vascular disease and on exam I do not find evidence of vascular pathology that would explain the patient's symptoms.  Noninvasive studies do not identify significant vascular problems  I suspect the patient is c/o pseudoclaudication.  Patient should have an evaluation of the LS spine which I defer to the primary service, pain management or the Spine service.  The patient should continue walking and begin a more formal exercise program. The patient should continue his antiplatelet therapy and aggressive treatment of the lipid abnormalities.  Patient will follow-up with me on a PRN basis.  3. Primary hypertension Continue antihypertensive medications as already ordered, these medications have been reviewed and there are no changes at this time.  4. Hyperlipidemia, unspecified hyperlipidemia type Continue statin as ordered and reviewed, no changes at this time    Levora Dredge, MD  02/08/2023 8:21 PM

## 2023-02-09 ENCOUNTER — Encounter (INDEPENDENT_AMBULATORY_CARE_PROVIDER_SITE_OTHER): Payer: Self-pay | Admitting: Vascular Surgery

## 2023-02-09 ENCOUNTER — Ambulatory Visit (INDEPENDENT_AMBULATORY_CARE_PROVIDER_SITE_OTHER): Payer: Medicare Other | Admitting: Vascular Surgery

## 2023-02-09 ENCOUNTER — Ambulatory Visit (INDEPENDENT_AMBULATORY_CARE_PROVIDER_SITE_OTHER): Payer: Medicare Other

## 2023-02-09 VITALS — BP 140/69 | HR 65 | Resp 18 | Ht 72.0 in | Wt 244.4 lb

## 2023-02-09 DIAGNOSIS — M79605 Pain in left leg: Secondary | ICD-10-CM | POA: Diagnosis not present

## 2023-02-09 DIAGNOSIS — M79604 Pain in right leg: Secondary | ICD-10-CM

## 2023-02-09 DIAGNOSIS — I739 Peripheral vascular disease, unspecified: Secondary | ICD-10-CM | POA: Diagnosis not present

## 2023-02-09 DIAGNOSIS — E785 Hyperlipidemia, unspecified: Secondary | ICD-10-CM | POA: Diagnosis not present

## 2023-02-09 DIAGNOSIS — M48061 Spinal stenosis, lumbar region without neurogenic claudication: Secondary | ICD-10-CM | POA: Diagnosis not present

## 2023-02-09 DIAGNOSIS — I10 Essential (primary) hypertension: Secondary | ICD-10-CM

## 2023-02-22 DIAGNOSIS — B372 Candidiasis of skin and nail: Secondary | ICD-10-CM | POA: Diagnosis not present

## 2023-03-02 DIAGNOSIS — M9901 Segmental and somatic dysfunction of cervical region: Secondary | ICD-10-CM | POA: Diagnosis not present

## 2023-03-02 DIAGNOSIS — M5136 Other intervertebral disc degeneration, lumbar region with discogenic back pain only: Secondary | ICD-10-CM | POA: Diagnosis not present

## 2023-03-02 DIAGNOSIS — M9903 Segmental and somatic dysfunction of lumbar region: Secondary | ICD-10-CM | POA: Diagnosis not present

## 2023-03-02 DIAGNOSIS — M9902 Segmental and somatic dysfunction of thoracic region: Secondary | ICD-10-CM | POA: Diagnosis not present

## 2023-03-16 DIAGNOSIS — M9902 Segmental and somatic dysfunction of thoracic region: Secondary | ICD-10-CM | POA: Diagnosis not present

## 2023-03-16 DIAGNOSIS — M9903 Segmental and somatic dysfunction of lumbar region: Secondary | ICD-10-CM | POA: Diagnosis not present

## 2023-03-16 DIAGNOSIS — M9901 Segmental and somatic dysfunction of cervical region: Secondary | ICD-10-CM | POA: Diagnosis not present

## 2023-03-16 DIAGNOSIS — M5136 Other intervertebral disc degeneration, lumbar region with discogenic back pain only: Secondary | ICD-10-CM | POA: Diagnosis not present

## 2023-03-28 DIAGNOSIS — G8929 Other chronic pain: Secondary | ICD-10-CM | POA: Diagnosis not present

## 2023-03-28 DIAGNOSIS — M25561 Pain in right knee: Secondary | ICD-10-CM | POA: Diagnosis not present

## 2023-03-28 DIAGNOSIS — M1711 Unilateral primary osteoarthritis, right knee: Secondary | ICD-10-CM | POA: Diagnosis not present

## 2023-04-02 DIAGNOSIS — M1711 Unilateral primary osteoarthritis, right knee: Secondary | ICD-10-CM | POA: Insufficient documentation

## 2023-04-05 ENCOUNTER — Other Ambulatory Visit: Payer: Self-pay

## 2023-04-05 ENCOUNTER — Encounter
Admission: RE | Admit: 2023-04-05 | Discharge: 2023-04-05 | Disposition: A | Discharge status: Home / Self Care | Payer: Medicare Other | Source: Ambulatory Visit | Attending: Orthopedic Surgery | Admitting: Orthopedic Surgery

## 2023-04-05 VITALS — BP 155/68 | HR 74 | Resp 16 | Ht 72.0 in | Wt 249.8 lb

## 2023-04-05 DIAGNOSIS — G629 Polyneuropathy, unspecified: Secondary | ICD-10-CM

## 2023-04-05 DIAGNOSIS — Z01812 Encounter for preprocedural laboratory examination: Secondary | ICD-10-CM

## 2023-04-05 DIAGNOSIS — R7303 Prediabetes: Secondary | ICD-10-CM

## 2023-04-05 DIAGNOSIS — M9903 Segmental and somatic dysfunction of lumbar region: Secondary | ICD-10-CM | POA: Diagnosis not present

## 2023-04-05 DIAGNOSIS — M5136 Other intervertebral disc degeneration, lumbar region with discogenic back pain only: Secondary | ICD-10-CM | POA: Diagnosis not present

## 2023-04-05 DIAGNOSIS — M1711 Unilateral primary osteoarthritis, right knee: Secondary | ICD-10-CM

## 2023-04-05 DIAGNOSIS — M9901 Segmental and somatic dysfunction of cervical region: Secondary | ICD-10-CM | POA: Diagnosis not present

## 2023-04-05 DIAGNOSIS — Z01818 Encounter for other preprocedural examination: Secondary | ICD-10-CM | POA: Insufficient documentation

## 2023-04-05 DIAGNOSIS — M9902 Segmental and somatic dysfunction of thoracic region: Secondary | ICD-10-CM | POA: Diagnosis not present

## 2023-04-05 DIAGNOSIS — Z01810 Encounter for preprocedural cardiovascular examination: Secondary | ICD-10-CM | POA: Diagnosis not present

## 2023-04-05 HISTORY — DX: Prediabetes: R73.03

## 2023-04-05 HISTORY — DX: Cardiac murmur, unspecified: R01.1

## 2023-04-05 LAB — HEMOGLOBIN A1C
Hgb A1c MFr Bld: 5.9 % — ABNORMAL HIGH (ref 4.8–5.6)
Mean Plasma Glucose: 122.63 mg/dL

## 2023-04-05 LAB — URINALYSIS, ROUTINE W REFLEX MICROSCOPIC
Bilirubin Urine: NEGATIVE
Glucose, UA: NEGATIVE mg/dL
Hgb urine dipstick: NEGATIVE
Ketones, ur: NEGATIVE mg/dL
Leukocytes,Ua: NEGATIVE
Nitrite: NEGATIVE
Protein, ur: NEGATIVE mg/dL
Specific Gravity, Urine: 1.019 (ref 1.005–1.030)
pH: 7 (ref 5.0–8.0)

## 2023-04-05 LAB — CBC
HCT: 38 % — ABNORMAL LOW (ref 39.0–52.0)
Hemoglobin: 12.9 g/dL — ABNORMAL LOW (ref 13.0–17.0)
MCH: 29.4 pg (ref 26.0–34.0)
MCHC: 33.9 g/dL (ref 30.0–36.0)
MCV: 86.6 fL (ref 80.0–100.0)
Platelets: 267 10*3/uL (ref 150–400)
RBC: 4.39 MIL/uL (ref 4.22–5.81)
RDW: 13.2 % (ref 11.5–15.5)
WBC: 7.9 10*3/uL (ref 4.0–10.5)
nRBC: 0 % (ref 0.0–0.2)

## 2023-04-05 LAB — COMPREHENSIVE METABOLIC PANEL
ALT: 16 U/L (ref 0–44)
AST: 23 U/L (ref 15–41)
Albumin: 3.9 g/dL (ref 3.5–5.0)
Alkaline Phosphatase: 63 U/L (ref 38–126)
Anion gap: 8 (ref 5–15)
BUN: 15 mg/dL (ref 8–23)
CO2: 25 mmol/L (ref 22–32)
Calcium: 8.5 mg/dL — ABNORMAL LOW (ref 8.9–10.3)
Chloride: 105 mmol/L (ref 98–111)
Creatinine, Ser: 0.76 mg/dL (ref 0.61–1.24)
GFR, Estimated: >60 mL/min (ref >60)
Glucose, Bld: 97 mg/dL (ref 70–99)
Potassium: 3.7 mmol/L (ref 3.5–5.1)
Sodium: 138 mmol/L (ref 135–145)
Total Bilirubin: 0.6 mg/dL (ref <1.2)
Total Protein: 6.5 g/dL (ref 6.5–8.1)

## 2023-04-05 LAB — C-REACTIVE PROTEIN: CRP: 1.3 mg/dL — ABNORMAL HIGH (ref <1.0)

## 2023-04-05 LAB — SEDIMENTATION RATE: Sed Rate: 8 mm/h (ref 0–20)

## 2023-04-05 LAB — SURGICAL PCR SCREEN
MRSA, PCR: NEGATIVE
Staphylococcus aureus: NEGATIVE

## 2023-04-05 NOTE — Patient Instructions (Addendum)
Your procedure is scheduled on: 04/10/23 - Monday Report to the Registration Desk on the 1st floor of [facility]. To find out your arrival time, please call [phone] between 1PM - 3PM on: 04/07/23 - Friday If your arrival time is 6:00 am, do not arrive before that time as the [facility] entrance doors do not open until 6:00 am.  REMEMBER: Instructions that are not followed completely may result in serious medical risk, up to and including death; or upon the discretion of your surgeon and anesthesiologist your surgery may need to be rescheduled.  Do not eat food after midnight the night before surgery.  No gum chewing or hard candies.  You may however, drink CLEAR liquids up to 2 hours before you are scheduled to arrive for your surgery. Do not drink anything within 2 hours of your scheduled arrival time.  Clear liquids include: - water  - apple juice without pulp - gatorade (not RED colors) - black coffee or tea (Do NOT add milk or creamers to the coffee or tea) Do NOT drink anything that is not on this list.  In addition, your doctor has ordered for you to drink the provided:  Ensure Pre-Surgery Clear Carbohydrate Drink  Drinking this carbohydrate drink up to two hours before surgery helps to reduce insulin resistance and improve patient outcomes. Please complete drinking 2 hours before scheduled arrival time.  One week prior to surgery: Stop Anti-inflammatories (NSAIDS) such as Advil, Aleve, Ibuprofen, Motrin, Naproxen, Naprosyn and Aspirin based products such as Excedrin, Goody's Powder, BC Powder. You may take Tylenol if needed for pain up until the day of surgery.  Stop ANY OVER THE COUNTER supplements until after surgery : Calcium Citrate-Vitamin D , Magnesium .  ON THE DAY OF SURGERY ONLY TAKE THESE MEDICATIONS WITH SIPS OF WATER: atorvastatin (LIPITOR)   No Alcohol for 24 hours before or after surgery.  No Smoking including e-cigarettes for 24 hours before  surgery.  No chewable tobacco products for at least 6 hours before surgery.  No nicotine patches on the day of surgery.  Do not use any "recreational" drugs for at least a week (preferably 2 weeks) before your surgery.  Please be advised that the combination of cocaine and anesthesia may have negative outcomes, up to and including death. If you test positive for cocaine, your surgery will be cancelled.  On the morning of surgery brush your teeth with toothpaste and water, you may rinse your mouth with mouthwash if you wish. Do not swallow any toothpaste or mouthwash.  Use CHG Soap or wipes as directed on instruction sheet.  Do not wear jewelry, make-up, hairpins, clips or nail polish.  For welded (permanent) jewelry: bracelets, anklets, waist bands, etc.  Please have this removed prior to surgery.  If it is not removed, there is a chance that hospital personnel will need to cut it off on the day of surgery.  Do not wear lotions, powders, or perfumes.   Do not shave body hair from the neck down 48 hours before surgery.  Contact lenses, hearing aids and dentures may not be worn into surgery.  Do not bring valuables to the hospital. [facility] is not responsible for any missing/lost belongings or valuables.   Notify your doctor if there is any change in your medical condition (cold, fever, infection).  Wear comfortable clothing (specific to your surgery type) to the hospital.  After surgery, you can help prevent lung complications by doing breathing exercises.  Take  deep breaths and cough every 1-2 hours. Your doctor may order a device called an Incentive Spirometer to help you take deep breaths. When coughing or sneezing, hold a pillow firmly against your incision with both hands. This is called "splinting." Doing this helps protect your incision. It also decreases belly discomfort.  If you are being admitted to the hospital overnight, leave your suitcase in the car. After surgery it  may be brought to your room.  In case of increased patient census, it may be necessary for you, the patient, to continue your postoperative care in the Same Day Surgery department.  If you are being discharged the day of surgery, you will not be allowed to drive home. You will need a responsible individual to drive you home and stay with you for 24 hours after surgery.   If you are taking public transportation, you will need to have a responsible individual with you.  Please call the Pre-admissions Testing Dept. at [phone] if you have any questions about these instructions.  Surgery Visitation Policy:  Patients having surgery or a procedure may have two visitors.  Children under the age of 55 must have an adult with them who is not the patient.  Inpatient Visitation:    Visiting hours are 7 a.m. to 8 p.m. Up to four visitors are allowed at one time in a patient room. The visitors may rotate out with other people during the day.  One visitor age 87 or older may stay with the patient overnight and must be in the room by 8 p.m.    Pre-operative 5 CHG Bath Instructions   You can play a key role in reducing the risk of infection after surgery. Your skin needs to be as free of germs as possible. You can reduce the number of germs on your skin by washing with CHG (chlorhexidine gluconate) soap before surgery. CHG is an antiseptic soap that kills germs and continues to kill germs even after washing.   DO NOT use if you have an allergy to chlorhexidine/CHG or antibacterial soaps. If your skin becomes reddened or irritated, stop using the CHG and notify one of our RNs at [phone].   Please shower with the CHG soap starting 4 days before surgery using the following schedule: 04/06/23 - 04/10/23 .    Please keep in mind the following:  DO NOT shave, including legs and underarms, starting the day of your first shower.   You may shave your face at any point before/day of surgery.   Place clean sheets on your bed the day you start using CHG soap. Use a clean washcloth (not used since being washed) for each shower. DO NOT sleep with pets once you start using the CHG.   CHG Shower Instructions:  If you choose to wash your hair and private area, wash first with your normal shampoo/soap.  After you use shampoo/soap, rinse your hair and body thoroughly to remove shampoo/soap residue.  Turn the water OFF and apply about 3 tablespoons (45 ml) of CHG soap to a CLEAN washcloth.  Apply CHG soap ONLY FROM YOUR NECK DOWN TO YOUR TOES (washing for 3-5 minutes)  DO NOT use CHG soap on face, private areas, open wounds, or sores.  Pay special attention to the area where your surgery is being performed.  If you are having back surgery, having someone wash your back for you may be helpful. Wait 2 minutes after CHG soap is applied, then you may rinse off the  CHG soap.  Pat dry with a clean towel  Put on clean clothes/pajamas   If you choose to wear lotion, please use ONLY the CHG-compatible lotions on the back of this paper.     Additional instructions for the day of surgery: DO NOT APPLY any lotions, deodorants, cologne, or perfumes.   Put on clean/comfortable clothes.  Brush your teeth.  Ask your nurse before applying any prescription medications to the skin.      CHG Compatible Lotions   Aveeno Moisturizing lotion  Cetaphil Moisturizing Cream  Cetaphil Moisturizing Lotion  Clairol Herbal Essence Moisturizing Lotion, Dry Skin  Clairol Herbal Essence Moisturizing Lotion, Extra Dry Skin  Clairol Herbal Essence Moisturizing Lotion, Normal Skin  Curel Age Defying Therapeutic Moisturizing Lotion with Alpha Hydroxy  Curel Extreme Care Body Lotion  Curel Soothing Hands Moisturizing Hand Lotion  Curel Therapeutic Moisturizing Cream, Fragrance-Free  Curel Therapeutic Moisturizing Lotion, Fragrance-Free  Curel Therapeutic Moisturizing Lotion, Original Formula  Eucerin Daily  Replenishing Lotion  Eucerin Dry Skin Therapy Plus Alpha Hydroxy Crme  Eucerin Dry Skin Therapy Plus Alpha Hydroxy Lotion  Eucerin Original Crme  Eucerin Original Lotion  Eucerin Plus Crme Eucerin Plus Lotion  Eucerin TriLipid Replenishing Lotion  Keri Anti-Bacterial Hand Lotion  Keri Deep Conditioning Original Lotion Dry Skin Formula Softly Scented  Keri Deep Conditioning Original Lotion, Fragrance Free Sensitive Skin Formula  Keri Lotion Fast Absorbing Fragrance Free Sensitive Skin Formula  Keri Lotion Fast Absorbing Softly Scented Dry Skin Formula  Keri Original Lotion  Keri Skin Renewal Lotion Keri Silky Smooth Lotion  Keri Silky Smooth Sensitive Skin Lotion  Nivea Body Creamy Conditioning Oil  Nivea Body Extra Enriched Lotion  Nivea Body Original Lotion  Nivea Body Sheer Moisturizing Lotion Nivea Crme  Nivea Skin Firming Lotion  NutraDerm 30 Skin Lotion  NutraDerm Skin Lotion  NutraDerm Therapeutic Skin Cream  NutraDerm Therapeutic Skin Lotion  ProShield Protective Hand Cream  Provon moisturizing lotion   How to Use an Incentive Spirometer  An incentive spirometer is a tool that measures how well you are filling your lungs with each breath. Learning to take long, deep breaths using this tool can help you keep your lungs clear and active. This may help to reverse or lessen your chance of developing breathing (pulmonary) problems, especially infection. You may be asked to use a spirometer: After a surgery. If you have a lung problem or a history of smoking. After a long period of time when you have been unable to move or be active. If the spirometer includes an indicator to show the highest number that you have reached, your health care provider or respiratory therapist will help you set a goal. Keep a log of your progress as told by your health care provider. What are the risks? Breathing too quickly may cause dizziness or cause you to pass out. Take your time so you do  not get dizzy or light-headed. If you are in pain, you may need to take pain medicine before doing incentive spirometry. It is harder to take a deep breath if you are having pain. How to use your incentive spirometer  Sit up on the edge of your bed or on a chair. Hold the incentive spirometer so that it is in an upright position. Before you use the spirometer, breathe out normally. Place the mouthpiece in your mouth. Make sure your lips are closed tightly around it. Breathe in slowly and as deeply as you can through your mouth, causing the piston or  the ball to rise toward the top of the chamber. Hold your breath for 3-5 seconds, or for as long as possible. If the spirometer includes a coach indicator, use this to guide you in breathing. Slow down your breathing if the indicator goes above the marked areas. Remove the mouthpiece from your mouth and breathe out normally. The piston or ball will return to the bottom of the chamber. Rest for a few seconds, then repeat the steps 10 or more times. Take your time and take a few normal breaths between deep breaths so that you do not get dizzy or light-headed. Do this every 1-2 hours when you are awake. If the spirometer includes a goal marker to show the highest number you have reached (best effort), use this as a goal to work toward during each repetition. After each set of 10 deep breaths, cough a few times. This will help to make sure that your lungs are clear. If you have an incision on your chest or abdomen from surgery, place a pillow or a rolled-up towel firmly against the incision when you cough. This can help to reduce pain while taking deep breaths and coughing. General tips When you are able to get out of bed: Walk around often. Continue to take deep breaths and cough in order to clear your lungs. Keep using the incentive spirometer until your health care provider says it is okay to stop using it. If you have been in the hospital, you may  be told to keep using the spirometer at home. Contact a health care provider if: You are having difficulty using the spirometer. You have trouble using the spirometer as often as instructed. Your pain medicine is not giving enough relief for you to use the spirometer as told. You have a fever. Get help right away if: You develop shortness of breath. You develop a cough with bloody mucus from the lungs. You have fluid or blood coming from an incision site after you cough. Summary An incentive spirometer is a tool that can help you learn to take long, deep breaths to keep your lungs clear and active. You may be asked to use a spirometer after a surgery, if you have a lung problem or a history of smoking, or if you have been inactive for a long period of time. Use your incentive spirometer as instructed every 1-2 hours while you are awake. If you have an incision on your chest or abdomen, place a pillow or a rolled-up towel firmly against your incision when you cough. This will help to reduce pain. Get help right away if you have shortness of breath, you cough up bloody mucus, or blood comes from your incision when you cough. This information is not intended to replace advice given to you by your health care provider. Make sure you discuss any questions you have with your health care provider. Document Revised: 07/01/2019 Document Reviewed: 07/01/2019 Elsevier Patient Education  2023 [employer]    POLAR CARE INFORMATION  MassAdvertisement.it  How to use [facility] Therapy System?  YouTube   ShippingScam.co.uk  OPERATING INSTRUCTIONS  Start the product With dry hands, connect the transformer to the electrical connection located on the top of the cooler. Next, plug the transformer into an appropriate electrical outlet. The unit will automatically start running at this point.  To stop the pump, disconnect electrical power.  Unplug to stop the product  when not in use. Unplugging the Polar Care unit turns it  off. Always unplug immediately after use. Never leave it plugged in while unattended. Remove pad.    FIRST ADD WATER TO FILL LINE, THEN ICE---Replace ice when existing ice is almost melted  1 Discuss Treatment with your Licensed Health Care Practitioner and Use Only as Prescribed 2 Apply Insulation Barrier & Cold Therapy Pad 3 Check for Moisture 4 Inspect Skin Regularly  Tips and Trouble Shooting Usage Tips 1. Use cubed or chunked ice for optimal performance. 2. It is recommended to drain the Pad between uses. To drain the pad, hold the Pad upright with the hose pointed toward the ground. Depress the black plunger and allow water to drain out. 3. You may disconnect the Pad from the unit without removing the pad from the affected area by depressing the silver tabs on the hose coupling and gently pulling the hoses apart. The Pad and unit will seal itself and will not leak. Note: Some dripping during release is normal. 4. DO NOT RUN PUMP WITHOUT WATER! The pump in this unit is designed to run with water. Running the unit without water will cause permanent damage to the pump. 5. Unplug unit before removing lid.  TROUBLESHOOTING GUIDE Pump not running, Water not flowing to the pad, Pad is not getting cold 1. Make sure the transformer is plugged into the wall outlet. 2. Confirm that the ice and water are filled to the indicated levels. 3. Make sure there are no kinks in the pad. 4. Gently pull on the blue tube to make sure the tube/pad junction is straight. 5. Remove the pad from the treatment site and ll it while the pad is lying at; then reapply. 6. Confirm that the pad couplings are securely attached to the unit. Listen for the double clicks (Figure 1) to confirm the pad couplings are securely attached.  Leaks    Note: Some condensation on the lines, controller, and pads is unavoidable, especially in warmer climates. 1. If using a [employer]  Polar Care Cold Therapy unit with a detachable Cold Therapy Pad, and a leak exists (other than condensation on the lines) disconnect the pad couplings. Make sure the silver tabs on the couplings are depressed before reconnecting the pad to the pump hose; then confirm both sides of the coupling are properly clicked in. 2. If the coupling continues to leak or a leak is detected in the pad itself, stop using it and call [employer] Customer Care at [phone].  Cleaning After use, empty and dry the unit with a soft cloth. Warm water and mild detergent may be used occasionally to clean the pump and tubes.  WARNING: The Polar Care Cube can be cold enough to cause serious injury, including full skin necrosis. Follow these Operating Instructions, and carefully read the Product Insert (see pouch on side of unit) and the Cold Therapy Pad Fitting Instructions (provided with each Cold Therapy Pad) prior to use.

## 2023-04-05 NOTE — Discharge Instructions (Signed)

## 2023-04-06 ENCOUNTER — Encounter: Payer: Self-pay | Admitting: Urgent Care

## 2023-04-10 ENCOUNTER — Ambulatory Visit: Admission: RE | Admit: 2023-04-10 | Payer: Medicare Other | Source: Home / Self Care | Admitting: Orthopedic Surgery

## 2023-04-10 ENCOUNTER — Encounter: Admission: RE | Payer: Self-pay | Source: Home / Self Care

## 2023-04-10 DIAGNOSIS — R7303 Prediabetes: Secondary | ICD-10-CM

## 2023-04-10 DIAGNOSIS — G629 Polyneuropathy, unspecified: Secondary | ICD-10-CM

## 2023-04-10 DIAGNOSIS — M1711 Unilateral primary osteoarthritis, right knee: Secondary | ICD-10-CM

## 2023-04-10 SURGERY — ARTHROPLASTY, KNEE, TOTAL, USING IMAGELESS COMPUTER-ASSISTED NAVIGATION
Anesthesia: Choice | Site: Knee | Laterality: Right

## 2023-04-17 ENCOUNTER — Encounter
Admission: RE | Admit: 2023-04-17 | Discharge: 2023-04-17 | Disposition: A | Discharge status: Home / Self Care | Payer: Medicare Other | Source: Ambulatory Visit | Attending: Orthopedic Surgery | Admitting: Orthopedic Surgery

## 2023-04-17 ENCOUNTER — Other Ambulatory Visit: Payer: Self-pay | Admitting: Internal Medicine

## 2023-04-17 DIAGNOSIS — Z01818 Encounter for other preprocedural examination: Secondary | ICD-10-CM

## 2023-04-17 NOTE — Patient Instructions (Addendum)
Your procedure is scheduled on:04-24-23 Monday Report to the Registration Desk on the 1st floor of the [facility].Then proceed to the 2nd floor Surgery Desk To find out your arrival time, please call [phone] between 1PM - 3PM on:04-21-23 Friday If your arrival time is 6:00 am, do not arrive before that time as the [facility] entrance doors do not open until 6:00 am.  REMEMBER: Instructions that are not followed completely may result in serious medical risk, up to and including death; or upon the discretion of your surgeon and anesthesiologist your surgery may need to be rescheduled.  Do not eat food after midnight the night before surgery.  No gum chewing or hard candies.  You may however, drink CLEAR liquids up to 2 hours before you are scheduled to arrive for your surgery. Do not drink anything within 2 hours of your scheduled arrival time.  Clear liquids include: - water  - apple juice without pulp - gatorade (not RED colors) - black coffee or tea (Do NOT add milk or creamers to the coffee or tea) Do NOT drink anything that is not on this list.  In addition, your doctor has ordered for you to drink the provided:  Ensure Pre-Surgery Clear Carbohydrate Drink  Drinking this carbohydrate drink up to two hours before surgery helps to reduce insulin resistance and improve patient outcomes. Please complete drinking 2 hours before scheduled arrival time.  One week prior to surgery:Stop NOW (04-17-23) Stop Anti-inflammatories (NSAIDS) such as Advil, Aleve, Ibuprofen, Motrin, Naproxen, Naprosyn and Aspirin based products such as Excedrin, Goody's Powder, BC Powder. Stop ANY OVER THE COUNTER supplements until after surgery (Calcium + D, Magnesium)  You may however, continue to take Tylenol if needed for pain up until the day of surgery.  Stop your 81 mg Aspirin 5 days prior to surgery (as you were instructed by Dr Dorna Bloom office)-Last dose will be on 04-18-23 Tuesday  Continue  taking all of your other prescription medications up until the day of surgery.  ON THE DAY OF SURGERY ONLY TAKE THESE MEDICATIONS WITH SIPS OF WATER: -atorvastatin (LIPITOR)   No Alcohol for 24 hours before or after surgery.  No Smoking including e-cigarettes for 24 hours before surgery.  No chewable tobacco products for at least 6 hours before surgery.  No nicotine patches on the day of surgery.  Do not use any "recreational" drugs for at least a week (preferably 2 weeks) before your surgery.  Please be advised that the combination of cocaine and anesthesia may have negative outcomes, up to and including death. If you test positive for cocaine, your surgery will be cancelled.  On the morning of surgery brush your teeth with toothpaste and water, you may rinse your mouth with mouthwash if you wish. Do not swallow any toothpaste or mouthwash.  Use CHG Soap as directed on instruction sheet.  Do not wear jewelry, make-up, hairpins, clips or nail polish.  For welded (permanent) jewelry: bracelets, anklets, waist bands, etc.  Please have this removed prior to surgery.  If it is not removed, there is a chance that hospital personnel will need to cut it off on the day of surgery.  Do not wear lotions, powders, or perfumes.   Do not shave body hair from the neck down 48 hours before surgery.  Contact lenses, hearing aids and dentures may not be worn into surgery.  Do not bring valuables to the hospital. [facility] is not responsible for any missing/lost belongings or valuables.  Notify your doctor if there is any change in your medical condition (cold, fever, infection).  Wear comfortable clothing (specific to your surgery type) to the hospital.  After surgery, you can help prevent lung complications by doing breathing exercises.  Take deep breaths and cough every 1-2 hours. Your doctor may order a device called an Incentive Spirometer to help you take deep breaths. When coughing or  sneezing, hold a pillow firmly against your incision with both hands. This is called "splinting." Doing this helps protect your incision. It also decreases belly discomfort.  If you are being admitted to the hospital overnight, leave your suitcase in the car. After surgery it may be brought to your room.  In case of increased patient census, it may be necessary for you, the patient, to continue your postoperative care in the Same Day Surgery department.  If you are being discharged the day of surgery, you will not be allowed to drive home. You will need a responsible individual to drive you home and stay with you for 24 hours after surgery.   If you are taking public transportation, you will need to have a responsible individual with you.  Please call the Pre-admissions Testing Dept. at [phone] if you have any questions about these instructions.  Surgery Visitation Policy:  Patients having surgery or a procedure may have two visitors.  Children under the age of 60 must have an adult with them who is not the patient.  Inpatient Visitation:    Visiting hours are 7 a.m. to 8 p.m. Up to four visitors are allowed at one time in a patient room. The visitors may rotate out with other people during the day.  One visitor age 74 or older may stay with the patient overnight and must be in the room by 8 p.m.    Pre-operative 5 CHG Bath Instructions   You can play a key role in reducing the risk of infection after surgery. Your skin needs to be as free of germs as possible. You can reduce the number of germs on your skin by washing with CHG (chlorhexidine gluconate) soap before surgery. CHG is an antiseptic soap that kills germs and continues to kill germs even after washing.   DO NOT use if you have an allergy to chlorhexidine/CHG or antibacterial soaps. If your skin becomes reddened or irritated, stop using the CHG and notify one of our RNs at [phone].   Please shower with the CHG  soap starting 4 days before surgery using the following schedule:     Please keep in mind the following:  DO NOT shave, including legs and underarms, starting the day of your first shower.   You may shave your face at any point before/day of surgery.  Place clean sheets on your bed the day you start using CHG soap. Use a clean washcloth (not used since being washed) for each shower. DO NOT sleep with pets once you start using the CHG.   CHG Shower Instructions:  If you choose to wash your hair and private area, wash first with your normal shampoo/soap.  After you use shampoo/soap, rinse your hair and body thoroughly to remove shampoo/soap residue.  Turn the water OFF and apply about 3 tablespoons (45 ml) of CHG soap to a CLEAN washcloth.  Apply CHG soap ONLY FROM YOUR NECK DOWN TO YOUR TOES (washing for 3-5 minutes)  DO NOT use CHG soap on face, private areas, open wounds, or sores.  Pay special attention to  the area where your surgery is being performed.  If you are having back surgery, having someone wash your back for you may be helpful. Wait 2 minutes after CHG soap is applied, then you may rinse off the CHG soap.  Pat dry with a clean towel  Put on clean clothes/pajamas   If you choose to wear lotion, please use ONLY the CHG-compatible lotions on the back of this paper.     Additional instructions for the day of surgery: DO NOT APPLY any lotions, deodorants, cologne, or perfumes.   Put on clean/comfortable clothes.  Brush your teeth.  Ask your nurse before applying any prescription medications to the skin.      CHG Compatible Lotions   Aveeno Moisturizing lotion  Cetaphil Moisturizing Cream  Cetaphil Moisturizing Lotion  Clairol Herbal Essence Moisturizing Lotion, Dry Skin  Clairol Herbal Essence Moisturizing Lotion, Extra Dry Skin  Clairol Herbal Essence Moisturizing Lotion, Normal Skin  Curel Age Defying Therapeutic Moisturizing Lotion with Alpha Hydroxy  Curel  Extreme Care Body Lotion  Curel Soothing Hands Moisturizing Hand Lotion  Curel Therapeutic Moisturizing Cream, Fragrance-Free  Curel Therapeutic Moisturizing Lotion, Fragrance-Free  Curel Therapeutic Moisturizing Lotion, Original Formula  Eucerin Daily Replenishing Lotion  Eucerin Dry Skin Therapy Plus Alpha Hydroxy Crme  Eucerin Dry Skin Therapy Plus Alpha Hydroxy Lotion  Eucerin Original Crme  Eucerin Original Lotion  Eucerin Plus Crme Eucerin Plus Lotion  Eucerin TriLipid Replenishing Lotion  Keri Anti-Bacterial Hand Lotion  Keri Deep Conditioning Original Lotion Dry Skin Formula Softly Scented  Keri Deep Conditioning Original Lotion, Fragrance Free Sensitive Skin Formula  Keri Lotion Fast Absorbing Fragrance Free Sensitive Skin Formula  Keri Lotion Fast Absorbing Softly Scented Dry Skin Formula  Keri Original Lotion  Keri Skin Renewal Lotion Keri Silky Smooth Lotion  Keri Silky Smooth Sensitive Skin Lotion  Nivea Body Creamy Conditioning Oil  Nivea Body Extra Enriched Lotion  Nivea Body Original Lotion  Nivea Body Sheer Moisturizing Lotion Nivea Crme  Nivea Skin Firming Lotion  NutraDerm 30 Skin Lotion  NutraDerm Skin Lotion  NutraDerm Therapeutic Skin Cream  NutraDerm Therapeutic Skin Lotion  ProShield Protective Hand Cream  Provon moisturizing lotion  How to Use an Incentive Spirometer An incentive spirometer is a tool that measures how well you are filling your lungs with each breath. Learning to take long, deep breaths using this tool can help you keep your lungs clear and active. This may help to reverse or lessen your chance of developing breathing (pulmonary) problems, especially infection. You may be asked to use a spirometer: After a surgery. If you have a lung problem or a history of smoking. After a long period of time when you have been unable to move or be active. If the spirometer includes an indicator to show the highest number that you have reached, your  health care provider or respiratory therapist will help you set a goal. Keep a log of your progress as told by your health care provider. What are the risks? Breathing too quickly may cause dizziness or cause you to pass out. Take your time so you do not get dizzy or light-headed. If you are in pain, you may need to take pain medicine before doing incentive spirometry. It is harder to take a deep breath if you are having pain. How to use your incentive spirometer  Sit up on the edge of your bed or on a chair. Hold the incentive spirometer so that it is in an upright position. Before you  use the spirometer, breathe out normally. Place the mouthpiece in your mouth. Make sure your lips are closed tightly around it. Breathe in slowly and as deeply as you can through your mouth, causing the piston or the ball to rise toward the top of the chamber. Hold your breath for 3-5 seconds, or for as long as possible. If the spirometer includes a coach indicator, use this to guide you in breathing. Slow down your breathing if the indicator goes above the marked areas. Remove the mouthpiece from your mouth and breathe out normally. The piston or ball will return to the bottom of the chamber. Rest for a few seconds, then repeat the steps 10 or more times. Take your time and take a few normal breaths between deep breaths so that you do not get dizzy or light-headed. Do this every 1-2 hours when you are awake. If the spirometer includes a goal marker to show the highest number you have reached (best effort), use this as a goal to work toward during each repetition. After each set of 10 deep breaths, cough a few times. This will help to make sure that your lungs are clear. If you have an incision on your chest or abdomen from surgery, place a pillow or a rolled-up towel firmly against the incision when you cough. This can help to reduce pain while taking deep breaths and coughing. General tips When you are able to  get out of bed: Walk around often. Continue to take deep breaths and cough in order to clear your lungs. Keep using the incentive spirometer until your health care provider says it is okay to stop using it. If you have been in the hospital, you may be told to keep using the spirometer at home. Contact a health care provider if: You are having difficulty using the spirometer. You have trouble using the spirometer as often as instructed. Your pain medicine is not giving enough relief for you to use the spirometer as told. You have a fever. Get help right away if: You develop shortness of breath. You develop a cough with bloody mucus from the lungs. You have fluid or blood coming from an incision site after you cough. Summary An incentive spirometer is a tool that can help you learn to take long, deep breaths to keep your lungs clear and active. You may be asked to use a spirometer after a surgery, if you have a lung problem or a history of smoking, or if you have been inactive for a long period of time. Use your incentive spirometer as instructed every 1-2 hours while you are awake. If you have an incision on your chest or abdomen, place a pillow or a rolled-up towel firmly against your incision when you cough. This will help to reduce pain. Get help right away if you have shortness of breath, you cough up bloody mucus, or blood comes from your incision when you cough. This information is not intended to replace advice given to you by your health care provider. Make sure you discuss any questions you have with your health care provider. Document Revised: 07/01/2019 Document Reviewed: 07/01/2019 Elsevier Patient Education  2024 [employer].

## 2023-04-23 ENCOUNTER — Encounter: Payer: Self-pay | Admitting: Orthopedic Surgery

## 2023-04-23 NOTE — H&P (Signed)
ORTHOPAEDIC HISTORY & PHYSICAL Tyrann Donaho, Adelina Mings., MD - 03/28/2023 1:30 PM EST Formatting of this note is different from the original. Images from the original note were not included. Chief Complaint: Chief Complaint Patient presents with Right knee pain  Reason for Visit: The patient is a 82 y.o. male who presents today for reevaluation of his right knee. He has a long history of right knee pain. He underwent right knee arthroscopy, medial meniscectomy, and chondroplasty in December 2020 and was noted to have grade 3 chondromalacia to the medial compartment. He reports gradually worsening right knee pain, primarily along the medial and anterior aspect of the knee. The pain is worse with walking or stair ambulation. He does report some start up stiffness. He denies any locking of the knee but does report moderate swelling and giving way of the knee. The knee pain limits the patient's ability to ambulate long distances. The patient has not appreciated any significant improvement despite Tylenol, activity modification, and ambulatory aids. The patient states that the knee pain has progressed to the point that it is significantly interfering with his activities of daily living.  Of note, the patient has known lumbar and cervical spondylosis. He previously underwent cervical and lumbar procedures as per Dr. Lovell Sheehan. He currently is receiving epidural steroid injections from Dr. Mariah Milling.  Medications: Current Outpatient Medications Medication Sig Dispense Refill acetaminophen (TYLENOL) 500 MG tablet Take 1,000 mg by mouth at bedtime aspirin 81 MG EC tablet Take 81 mg by mouth once daily atorvastatin (LIPITOR) 20 MG tablet TAKE ONE TABLET BY MOUTH EVERY DAY calcium citrate-vitamin D3 (CITRACAL+D) 315-200 mg-unit tablet Take 1 tablet by mouth once daily deucravacitinib (SOTYKTU) 6 mg Tab Take 1 tablet by mouth once daily fluconazole (DIFLUCAN) 200 MG tablet 200 mg once a week Taking 2 pills  once daily hydrocortisone 2.5 % ointment ketoconazole (NIZORAL) 2 % cream losartan (COZAAR) 50 MG tablet Take 50 mg by mouth once daily magnesium chloride, bulk, Crys Take 1 tablet by mouth once daily tamsulosin (FLOMAX) 0.4 mg capsule Take 0.8 mg by mouth nightly  No current facility-administered medications for this visit.  Allergies: Allergies Allergen Reactions Hydrocodone Itching  Past Medical History: Past Medical History: Diagnosis Date Chickenpox Hyperlipidemia Hypertension  Past Surgical History: Past Surgical History: Procedure Laterality Date TONSILLECTOMY 1966 Left knee arthroscopy 06/02/2010 partial medial meniscectomy, chondroplasty of the medial femoral condyle, medial tibial plateau and patellofemoral articulation Left total knee arthroplasty 07/06/2011 Dr. Ernest Pine Right knee arthroscopy, partial medial and lateral meniscectomies, and chondroplasty 09/07/2015 Dr Ernest Pine Right knee arthroscopy, partial medial and lateral meniscectomies, and chondroplasty 09/25/2017 Dr Ernest Pine Cervical surgery 12/2017 Dr. Delma Officer Left inguinal hernia repair with mesh 05/23/2018 Dr. Chevis Pretty L3-4, L4-5 laminectomies, foraminotomies 06/20/2018 Dr. Delma Officer Right knee arthroscopy, partial medial meniscectomy, and chondroplasty 04/10/2019 Dr Ernest Pine Extensive arthroscopic debridement, arthroscopic subacromial decompression, and mini-open rotator cuff repair, left shoulder Left 04/01/2021 Dr.Poggi  Social History: Social History  Socioeconomic History Marital status: Married Spouse name: Darl Pikes Number of children: 2 Years of education: 14 Occupational History Occupation: Retired-Self Employed -Systems developer Tobacco Use Smoking status: Never Smokeless tobacco: Never Vaping Use Vaping status: Never Used Substance and Sexual Activity Alcohol use: No Alcohol/week: 0.0 standard drinks of alcohol Drug use: No Sexual activity: Yes Partners: Female  Social  Drivers of Health  Food Insecurity: No Food Insecurity (07/28/2022) Received from [facility] Hunger Vital Sign Worried About Running Out of Food in the Last Year: Never true Ran Out of Food in the  Last Year: Never true  Family History: Family History Problem Relation Name Age of Onset Heart failure Father  Review of Systems: A comprehensive 14 point ROS was performed, reviewed, and the pertinent orthopaedic findings are documented in the HPI.  Exam BP 124/76  Ht 182.9 cm (6')  Wt (!) 112.9 kg (249 lb)  BMI 33.77 kg/m  General: Well-developed, well-nourished male seen in no acute distress. Antalgic gait. Slight varus thrust to the right knee.  HEENT: Atraumatic, normocephalic. Pupils are equal and reactive to light. Extraocular motion is intact. Sclera are clear. Oropharynx is clear with moist mucosa.  Neck: Supple, nontender, and with good ROM. No thyromegaly, adenopathy, JVD, or carotid bruits.  Lungs: Clear to auscultation bilaterally.  Cardiovascular: Regular rate and rhythm. Normal S1, S2. No murmur . No appreciable gallops or rubs. Peripheral pulses are palpable. No lower extremity edema. Homan`s test is negative.  Abdomen: Soft, nontender, nondistended. Bowel sounds are present.  Extremities: Good strength, stability, and range of motion of the upper extremities. Good range of motion of the hips and ankles.  Right Knee: Soft tissue swelling: mild Effusion: minimal Erythema: none Crepitance: mild Tenderness: medial Alignment: relative varus Mediolateral laxity: medial pseudolaxity Posterior sag: negative Patellar tracking: Good tracking without evidence of subluxation or tilt Atrophy: No significant atrophy. Quadriceps tone was fair to good. Range of motion: 0/0/115 degrees  Neurologic: Awake, alert, and oriented. Sensory function is intact to pinprick and light touch. Motor strength is judged to be 5/5. Motor coordination is within normal  limits. No apparent clonus. No tremor.  X-rays: I ordered and interpreted standing AP, lateral, and sunrise radiographs of the right knee that were obtained in the office today. There is narrowing of the medial cartilage space with associated varus alignment. Osteophyte formation is noted. Subchondral sclerosis is noted. No evidence of fracture or dislocation.  Impression: Degenerative arthrosis of the right knee  Plan: The findings were discussed in detail with the patient. The patient was given informational material on total knee replacement. Conservative treatment options were reviewed with the patient. We discussed the risks and benefits of surgical intervention. The usual perioperative course was also discussed in detail. The patient expressed understanding of the risks and benefits of surgical intervention and would like to proceed with plans for right total knee arthroplasty.  I spent a total of 40 minutes in both face-to-face and non-face-to-face activities, excluding procedures performed, for this visit on the date of this encounter.  MEDICAL CLEARANCE: Per anesthesiology. ACTIVITY: As tolerated. WORK STATUS: Not applicable. THERAPY: Preoperative physical therapy evaluation. MEDICATIONS: Requested Prescriptions  No prescriptions requested or ordered in this encounter  FOLLOW-UP: Return for preop History & Physical pending surgery date.  Christena Sunderlin P. Angie Fava., M.D.  This note was generated in part with voice recognition software and I apologize for any typographical errors that were not detected and corrected.  Electronically signed by Shari Heritage., MD at 04/02/2023 8:37 AM EST

## 2023-04-23 NOTE — Discharge Instructions (Signed)
Instructions after Total Knee Replacement   Evan Giles P. Angie Fava., M.D.    Dept. of Orthopaedics & Sports Medicine [facility] [address] [place], [place]  [address]  Phone: [phone]   Fax: [phone]       [email or web address]       DIET: Drink plenty of non-alcoholic fluids. Resume your normal diet. Include foods high in fiber.  ACTIVITY:  You may use crutches or a walker with weight-bearing as tolerated, unless instructed otherwise. You may be weaned off of the walker or crutches by your Physical Therapist.  Do NOT place pillows under the knee. Anything placed under the knee could limit your ability to straighten the knee.   Continue doing gentle exercises. Exercising will reduce the pain and swelling, increase motion, and prevent muscle weakness.   Please continue to use the TED compression stockings for 6 weeks. You may remove the stockings at night, but should reapply them in the morning. Do not drive or operate any equipment until instructed.  WOUND CARE:  Continue to use the PolarCare or ice packs periodically to reduce pain and swelling. You may bathe or shower after the staples are removed at the first office visit following surgery.  MEDICATIONS: You may resume your regular medications. Please take the pain medication as prescribed on the medication. Do not take pain medication on an empty stomach. You have been given a prescription for a blood thinner (Lovenox or Coumadin). Please take the medication as instructed. (NOTE: After completing a 2 week course of Lovenox, take one Enteric-coated aspirin once a day. This along with elevation will help reduce the possibility of phlebitis in your operated leg.) Do not drive or drink alcoholic beverages when taking pain medications.  CALL THE OFFICE FOR: Temperature above 101 degrees Excessive bleeding or drainage on the dressing. Excessive swelling, coldness, or paleness of the toes. Persistent nausea and  vomiting.  FOLLOW-UP:  You should have an appointment to return to the office in 10-14 days after surgery. Arrangements have been made for continuation of Physical Therapy (either home therapy or outpatient therapy).     [facility] Department Directory         [email or web address]       [email or web address]          Cardiology  Appointments: [place] - [phone]  Endocrinology  Appointments: [place] [phone] [place] - [phone]  Gastroenterology  Appointments: [place] [phone] [place] - [phone]        General Surgery   Appointments: [facility]  Internal Medicine/Family Medicine  Appointments: [facility] [place] - [phone] [place] - [phone]  Metabolic and Weigh Loss Surgery  Appointments: [facility]        Neurology  Appointments: [place] [phone] [place] - [phone]  Neurosurgery  Appointments: [place]  Obstetrics & Gynecology  Appointments: [place] [phone] [place] - [phone]        Pediatrics  Appointments: [place] [phone] [place] - [phone]  Physiatry  Appointments: [place] [phone]  Physical Therapy  Appointments: [place] - [phone]        Podiatry  Appointments: [place] [phone] [place] - [phone]  Pulmonology  Appointments: [place]  Rheumatology  Appointments: [place] [phone]        [place] Location: [facility]  [address] [place], [place]  [address]  [place] Location: [facility] [address] [place], [place]  [address]  [place] Location: [facility] [address]  [address] [place], [place]  [address]        [employer]  [address] , [place], [place], [address]. [phone] They will call you to arrange when they can come to see you

## 2023-04-24 ENCOUNTER — Ambulatory Visit: Payer: Medicare Other | Admitting: Anesthesiology

## 2023-04-24 ENCOUNTER — Other Ambulatory Visit: Payer: Self-pay

## 2023-04-24 ENCOUNTER — Observation Stay: Payer: Medicare Other

## 2023-04-24 ENCOUNTER — Observation Stay
Admission: RE | Admit: 2023-04-24 | Discharge: 2023-04-25 | Disposition: A | Discharge status: Home / Self Care | Payer: Medicare Other | Attending: Orthopedic Surgery | Admitting: Orthopedic Surgery

## 2023-04-24 ENCOUNTER — Encounter
Admission: RE | Disposition: A | Discharge status: Home / Self Care | Payer: Self-pay | Source: Home / Self Care | Attending: Orthopedic Surgery

## 2023-04-24 ENCOUNTER — Encounter: Payer: Self-pay | Admitting: Orthopedic Surgery

## 2023-04-24 DIAGNOSIS — Z96652 Presence of left artificial knee joint: Secondary | ICD-10-CM | POA: Insufficient documentation

## 2023-04-24 DIAGNOSIS — Z96651 Presence of right artificial knee joint: Secondary | ICD-10-CM | POA: Diagnosis not present

## 2023-04-24 DIAGNOSIS — I10 Essential (primary) hypertension: Secondary | ICD-10-CM | POA: Diagnosis not present

## 2023-04-24 DIAGNOSIS — M1711 Unilateral primary osteoarthritis, right knee: Principal | ICD-10-CM | POA: Insufficient documentation

## 2023-04-24 DIAGNOSIS — Z7982 Long term (current) use of aspirin: Secondary | ICD-10-CM | POA: Insufficient documentation

## 2023-04-24 DIAGNOSIS — Z79899 Other long term (current) drug therapy: Secondary | ICD-10-CM | POA: Insufficient documentation

## 2023-04-24 HISTORY — PX: KNEE ARTHROPLASTY: SHX992

## 2023-04-24 SURGERY — ARTHROPLASTY, KNEE, TOTAL, USING IMAGELESS COMPUTER-ASSISTED NAVIGATION
Anesthesia: General | Site: Knee | Laterality: Right

## 2023-04-24 MED ORDER — TAMSULOSIN HCL 0.4 MG PO CAPS
ORAL_CAPSULE | ORAL | Status: AC
  Filled 2023-04-24: qty 1

## 2023-04-24 MED ORDER — FLEET ENEMA RE ENEM: 1.0000 | ENEMA | Freq: Once | RECTAL | Status: DC | PRN

## 2023-04-24 MED ORDER — ALUM & MAG HYDROXIDE-SIMETH 200-200-20 MG/5ML PO SUSP: 30.0000 mL | ORAL | Status: DC | PRN

## 2023-04-24 MED ORDER — PROPOFOL 500 MG/50ML IV EMUL
INTRAVENOUS | Status: DC | PRN
  Administered 2023-04-24: 50 ug/kg/min via INTRAVENOUS

## 2023-04-24 MED ORDER — OXYCODONE HCL 5 MG PO TABS: 5.0000 mg | ORAL_TABLET | Freq: Once | ORAL | Status: DC | PRN

## 2023-04-24 MED ORDER — ONDANSETRON HCL 4 MG/2ML IJ SOLN
INTRAMUSCULAR | Status: DC | PRN
  Administered 2023-04-24: 4 mg via INTRAVENOUS

## 2023-04-24 MED ORDER — FENTANYL CITRATE (PF) 100 MCG/2ML IJ SOLN
INTRAMUSCULAR | Status: DC | PRN
  Administered 2023-04-24 (×2): 50 ug via INTRAVENOUS

## 2023-04-24 MED ORDER — DEXAMETHASONE SODIUM PHOSPHATE 10 MG/ML IJ SOLN
8.0000 mg | Freq: Once | INTRAMUSCULAR | Status: AC
  Administered 2023-04-24: 8 mg via INTRAVENOUS

## 2023-04-24 MED ORDER — FERROUS SULFATE 325 (65 FE) MG PO TABS
ORAL_TABLET | ORAL | Status: AC
  Filled 2023-04-24: qty 1

## 2023-04-24 MED ORDER — KETOCONAZOLE 2 % EX CREA: 1.0000 | TOPICAL_CREAM | Freq: Every day | CUTANEOUS | Status: DC | PRN

## 2023-04-24 MED ORDER — MAGNESIUM HYDROXIDE 400 MG/5ML PO SUSP
30.0000 mL | Freq: Every day | ORAL | Status: DC
  Administered 2023-04-25: 30 mL via ORAL

## 2023-04-24 MED ORDER — ORAL CARE MOUTH RINSE: 15.0000 mL | OROMUCOSAL | Status: DC | PRN

## 2023-04-24 MED ORDER — SODIUM CHLORIDE 0.9 % IR SOLN
Status: DC | PRN
  Administered 2023-04-24: 3000 mL

## 2023-04-24 MED ORDER — ASPIRIN 81 MG PO CHEW
81.0000 mg | CHEWABLE_TABLET | Freq: Two times a day (BID) | ORAL | Status: DC
Start: 2023-04-25 — End: 2023-04-25
  Administered 2023-04-25: 81 mg via ORAL

## 2023-04-24 MED ORDER — SURGIPHOR WOUND IRRIGATION SYSTEM - OPTIME: TOPICAL | Status: DC | PRN

## 2023-04-24 MED ORDER — PHENOL 1.4 % MT LIQD: 1.0000 | OROMUCOSAL | Status: DC | PRN

## 2023-04-24 MED ORDER — OXYCODONE HCL 5 MG PO TABS
ORAL_TABLET | ORAL | Status: AC
  Filled 2023-04-24: qty 2

## 2023-04-24 MED ORDER — FENTANYL CITRATE (PF) 100 MCG/2ML IJ SOLN: 25.0000 ug | INTRAMUSCULAR | Status: DC | PRN

## 2023-04-24 MED ORDER — ACETAMINOPHEN 10 MG/ML IV SOLN
1000.0000 mg | Freq: Four times a day (QID) | INTRAVENOUS | Status: DC
  Administered 2023-04-24 – 2023-04-25 (×3): 1000 mg via INTRAVENOUS

## 2023-04-24 MED ORDER — DIPHENHYDRAMINE HCL 12.5 MG/5ML PO ELIX: 12.5000 mg | ORAL_SOLUTION | ORAL | Status: DC | PRN

## 2023-04-24 MED ORDER — ONDANSETRON HCL 4 MG/2ML IJ SOLN: 4.0000 mg | Freq: Four times a day (QID) | INTRAMUSCULAR | Status: DC | PRN

## 2023-04-24 MED ORDER — FENTANYL CITRATE (PF) 100 MCG/2ML IJ SOLN
INTRAMUSCULAR | Status: AC
  Filled 2023-04-24: qty 2

## 2023-04-24 MED ORDER — TRANEXAMIC ACID-NACL 1000-0.7 MG/100ML-% IV SOLN
INTRAVENOUS | Status: AC
  Filled 2023-04-24: qty 100

## 2023-04-24 MED ORDER — ACETAMINOPHEN 10 MG/ML IV SOLN
INTRAVENOUS | Status: DC | PRN
  Administered 2023-04-24: 1000 mg via INTRAVENOUS

## 2023-04-24 MED ORDER — METOCLOPRAMIDE HCL 10 MG PO TABS
10.0000 mg | ORAL_TABLET | Freq: Three times a day (TID) | ORAL | Status: DC
  Administered 2023-04-24 – 2023-04-25 (×4): 10 mg via ORAL

## 2023-04-24 MED ORDER — FERROUS SULFATE 325 (65 FE) MG PO TABS
325.0000 mg | ORAL_TABLET | Freq: Two times a day (BID) | ORAL | Status: DC
Start: 2023-04-24 — End: 2023-04-25
  Administered 2023-04-24 – 2023-04-25 (×2): 325 mg via ORAL

## 2023-04-24 MED ORDER — CEFAZOLIN SODIUM-DEXTROSE 2-4 GM/100ML-% IV SOLN
2.0000 g | INTRAVENOUS | Status: AC
  Administered 2023-04-24: 2 g via INTRAVENOUS

## 2023-04-24 MED ORDER — GABAPENTIN 300 MG PO CAPS
ORAL_CAPSULE | ORAL | Status: AC
  Filled 2023-04-24: qty 1

## 2023-04-24 MED ORDER — SODIUM CHLORIDE 0.9 % IV SOLN: INTRAVENOUS | Status: DC

## 2023-04-24 MED ORDER — CELECOXIB 200 MG PO CAPS
400.0000 mg | ORAL_CAPSULE | Freq: Once | ORAL | Status: AC
  Administered 2023-04-24: 400 mg via ORAL

## 2023-04-24 MED ORDER — PANTOPRAZOLE SODIUM 40 MG PO TBEC
40.0000 mg | DELAYED_RELEASE_TABLET | Freq: Two times a day (BID) | ORAL | Status: DC
  Administered 2023-04-24 – 2023-04-25 (×2): 40 mg via ORAL

## 2023-04-24 MED ORDER — BUPIVACAINE HCL (PF) 0.5 % IJ SOLN
INTRAMUSCULAR | Status: DC | PRN
  Administered 2023-04-24: 3 mL via INTRATHECAL

## 2023-04-24 MED ORDER — ONDANSETRON HCL 4 MG/2ML IJ SOLN
INTRAMUSCULAR | Status: AC
  Filled 2023-04-24: qty 2

## 2023-04-24 MED ORDER — METOCLOPRAMIDE HCL 10 MG PO TABS
ORAL_TABLET | ORAL | Status: AC
  Filled 2023-04-24: qty 1

## 2023-04-24 MED ORDER — LOSARTAN POTASSIUM 50 MG PO TABS
50.0000 mg | ORAL_TABLET | Freq: Every day | ORAL | Status: DC
  Administered 2023-04-25: 50 mg via ORAL

## 2023-04-24 MED ORDER — MAGNESIUM OXIDE 400 MG PO TABS
200.0000 mg | ORAL_TABLET | Freq: Every day | ORAL | Status: DC
  Administered 2023-04-25: 200 mg via ORAL
  Filled 2023-04-24: qty 0.5

## 2023-04-24 MED ORDER — HYDROMORPHONE HCL 1 MG/ML IJ SOLN: 0.5000 mg | INTRAMUSCULAR | Status: DC | PRN

## 2023-04-24 MED ORDER — ENSURE PRE-SURGERY PO LIQD
296.0000 mL | Freq: Once | ORAL | Status: DC
  Filled 2023-04-24: qty 296

## 2023-04-24 MED ORDER — CELECOXIB 200 MG PO CAPS
ORAL_CAPSULE | ORAL | Status: AC
  Filled 2023-04-24: qty 1

## 2023-04-24 MED ORDER — CEFAZOLIN SODIUM-DEXTROSE 2-4 GM/100ML-% IV SOLN
INTRAVENOUS | Status: AC
  Filled 2023-04-24: qty 100

## 2023-04-24 MED ORDER — ONDANSETRON HCL 4 MG PO TABS: 4.0000 mg | ORAL_TABLET | Freq: Four times a day (QID) | ORAL | Status: DC | PRN

## 2023-04-24 MED ORDER — CHLORHEXIDINE GLUCONATE 0.12 % MT SOLN
15.0000 mL | Freq: Once | OROMUCOSAL | Status: AC
  Administered 2023-04-24: 15 mL via OROMUCOSAL

## 2023-04-24 MED ORDER — DEXAMETHASONE SODIUM PHOSPHATE 10 MG/ML IJ SOLN
INTRAMUSCULAR | Status: AC
  Filled 2023-04-24: qty 1

## 2023-04-24 MED ORDER — LACTATED RINGERS IV SOLN: INTRAVENOUS | Status: DC

## 2023-04-24 MED ORDER — PROPOFOL 1000 MG/100ML IV EMUL
INTRAVENOUS | Status: AC
  Filled 2023-04-24: qty 100

## 2023-04-24 MED ORDER — TRANEXAMIC ACID-NACL 1000-0.7 MG/100ML-% IV SOLN
1000.0000 mg | INTRAVENOUS | Status: AC
  Administered 2023-04-24: 1000 mg via INTRAVENOUS

## 2023-04-24 MED ORDER — ACETAMINOPHEN 10 MG/ML IV SOLN
1000.0000 mg | Freq: Once | INTRAVENOUS | Status: DC | PRN
Start: 2023-04-24 — End: 2023-04-24

## 2023-04-24 MED ORDER — OXYCODONE HCL 5 MG PO TABS
10.0000 mg | ORAL_TABLET | ORAL | Status: DC | PRN
  Administered 2023-04-24 – 2023-04-25 (×3): 10 mg via ORAL

## 2023-04-24 MED ORDER — PANTOPRAZOLE SODIUM 40 MG PO TBEC
DELAYED_RELEASE_TABLET | ORAL | Status: AC
  Filled 2023-04-24: qty 1

## 2023-04-24 MED ORDER — ACETAMINOPHEN 10 MG/ML IV SOLN
INTRAVENOUS | Status: AC
  Filled 2023-04-24: qty 100

## 2023-04-24 MED ORDER — OXYCODONE HCL 5 MG PO TABS: 5.0000 mg | ORAL_TABLET | ORAL | Status: DC | PRN

## 2023-04-24 MED ORDER — CEFAZOLIN SODIUM-DEXTROSE 2-4 GM/100ML-% IV SOLN
2.0000 g | Freq: Four times a day (QID) | INTRAVENOUS | Status: AC
  Administered 2023-04-24 (×2): 2 g via INTRAVENOUS

## 2023-04-24 MED ORDER — TRAMADOL HCL 50 MG PO TABS
50.0000 mg | ORAL_TABLET | ORAL | Status: DC | PRN
Start: 2023-04-24 — End: 2023-04-25

## 2023-04-24 MED ORDER — SENNOSIDES-DOCUSATE SODIUM 8.6-50 MG PO TABS
1.0000 | ORAL_TABLET | Freq: Two times a day (BID) | ORAL | Status: DC
  Administered 2023-04-24 – 2023-04-25 (×2): 1 via ORAL

## 2023-04-24 MED ORDER — CELECOXIB 200 MG PO CAPS
200.0000 mg | ORAL_CAPSULE | Freq: Two times a day (BID) | ORAL | Status: DC
  Administered 2023-04-24 – 2023-04-25 (×2): 200 mg via ORAL

## 2023-04-24 MED ORDER — ORAL CARE MOUTH RINSE: 15.0000 mL | Freq: Once | OROMUCOSAL | Status: AC

## 2023-04-24 MED ORDER — ACETAMINOPHEN 325 MG PO TABS: 325.0000 mg | ORAL_TABLET | Freq: Four times a day (QID) | ORAL | Status: DC | PRN

## 2023-04-24 MED ORDER — MENTHOL 3 MG MT LOZG: 1.0000 | LOZENGE | OROMUCOSAL | Status: DC | PRN

## 2023-04-24 MED ORDER — TAMSULOSIN HCL 0.4 MG PO CAPS
0.8000 mg | ORAL_CAPSULE | Freq: Every day | ORAL | Status: DC
  Administered 2023-04-24: 0.8 mg via ORAL

## 2023-04-24 MED ORDER — SENNOSIDES-DOCUSATE SODIUM 8.6-50 MG PO TABS
ORAL_TABLET | ORAL | Status: AC
  Filled 2023-04-24: qty 1

## 2023-04-24 MED ORDER — SODIUM CHLORIDE (PF) 0.9 % IJ SOLN
INTRAMUSCULAR | Status: DC | PRN
  Administered 2023-04-24: 120 mL via INTRAMUSCULAR

## 2023-04-24 MED ORDER — ATORVASTATIN CALCIUM 20 MG PO TABS
20.0000 mg | ORAL_TABLET | ORAL | Status: DC
Start: 2023-04-25 — End: 2023-04-25
  Administered 2023-04-25: 20 mg via ORAL

## 2023-04-24 MED ORDER — BISACODYL 10 MG RE SUPP: 10.0000 mg | Freq: Every day | RECTAL | Status: DC | PRN

## 2023-04-24 MED ORDER — GABAPENTIN 300 MG PO CAPS
300.0000 mg | ORAL_CAPSULE | Freq: Once | ORAL | Status: AC
  Administered 2023-04-24: 300 mg via ORAL

## 2023-04-24 MED ORDER — CHLORHEXIDINE GLUCONATE 4 % EX SOLN
60.0000 mL | Freq: Once | CUTANEOUS | Status: AC
  Administered 2023-04-24: 4 via TOPICAL

## 2023-04-24 MED ORDER — ONDANSETRON HCL 4 MG/2ML IJ SOLN
4.0000 mg | Freq: Once | INTRAMUSCULAR | Status: DC | PRN
Start: 2023-04-24 — End: 2023-04-24

## 2023-04-24 MED ORDER — OXYCODONE HCL 5 MG/5ML PO SOLN: 5.0000 mg | Freq: Once | ORAL | Status: DC | PRN

## 2023-04-24 MED ORDER — CHLORHEXIDINE GLUCONATE 0.12 % MT SOLN
OROMUCOSAL | Status: AC
  Filled 2023-04-24: qty 15

## 2023-04-24 MED ORDER — TRANEXAMIC ACID-NACL 1000-0.7 MG/100ML-% IV SOLN
1000.0000 mg | Freq: Once | INTRAVENOUS | Status: AC
  Administered 2023-04-24: 1000 mg via INTRAVENOUS

## 2023-04-24 MED ORDER — LACTATED RINGERS IV SOLN: INTRAVENOUS | Status: AC

## 2023-04-24 MED ORDER — CELECOXIB 200 MG PO CAPS
ORAL_CAPSULE | ORAL | Status: AC
  Filled 2023-04-24: qty 2

## 2023-04-24 SURGICAL SUPPLY — 66 items
ATTUNE MED DOME PAT 41 KNEE (Knees) IMPLANT
ATTUNE PS FEM RT SZ 8 CEM KNEE (Femur) IMPLANT
ATTUNE PSRP INSR SZ8 5 KNEE (Insert) IMPLANT
BASE TIBIAL ATTUNE KNEE SZ9 (Knees) IMPLANT
BATTERY INSTRU NAVIGATION (MISCELLANEOUS) ×4 IMPLANT
BIT DRILL QUICK REL 1/8 2PK SL (BIT) ×1 IMPLANT
BLADE CLIPPER SURG (BLADE) IMPLANT
BLADE SAW 70X12.5 (BLADE) ×1 IMPLANT
BLADE SAW 90X13X1.19 OSCILLAT (BLADE) ×1 IMPLANT
BLADE SAW 90X25X1.19 OSCILLAT (BLADE) ×1 IMPLANT
BONE CEMENT GENTAMICIN (Cement) ×2 IMPLANT
BRUSH SCRUB EZ PLAIN DRY (MISCELLANEOUS) ×1 IMPLANT
CEMENT BONE GENTAMICIN 40 (Cement) IMPLANT
COOLER POLAR GLACIER W/PUMP (MISCELLANEOUS) ×1 IMPLANT
CUFF TRNQT CYL 24X4X16.5-23 (TOURNIQUET CUFF) IMPLANT
CUFF TRNQT CYL 30X4X21-28X (TOURNIQUET CUFF) IMPLANT
DRAPE SHEET LG 3/4 BI-LAMINATE (DRAPES) IMPLANT
DRSG AQUACEL AG ADV 3.5X14 (GAUZE/BANDAGES/DRESSINGS) ×1 IMPLANT
DRSG MEPILEX SACRM 8.7X9.8 (GAUZE/BANDAGES/DRESSINGS) ×1 IMPLANT
DRSG TEGADERM 4X4.75 (GAUZE/BANDAGES/DRESSINGS) ×1 IMPLANT
DURAPREP 26ML APPLICATOR (WOUND CARE) ×2 IMPLANT
ELECT CAUTERY BLADE 6.4 (BLADE) ×1 IMPLANT
ELECT REM PT RETURN 9FT ADLT (ELECTROSURGICAL) ×1
ELECTRODE REM PT RTRN 9FT ADLT (ELECTROSURGICAL) ×1 IMPLANT
EVACUATOR 1/8 PVC DRAIN (DRAIN) ×1 IMPLANT
EX-PIN ORTHOLOCK NAV 4X150 (PIN) ×2 IMPLANT
GAUZE XEROFORM 1X8 LF (GAUZE/BANDAGES/DRESSINGS) ×1 IMPLANT
GLOVE BIOGEL M STRL SZ7.5 (GLOVE) ×6 IMPLANT
GLOVE SURG UNDER POLY LF SZ8 (GLOVE) ×2 IMPLANT
GOWN STRL REUS W/ TWL LRG LVL3 (GOWN DISPOSABLE) ×1 IMPLANT
GOWN STRL REUS W/ TWL XL LVL3 (GOWN DISPOSABLE) ×1 IMPLANT
GOWN TOGA ZIPPER T7+ PEEL AWAY (MISCELLANEOUS) ×1 IMPLANT
HOLDER FOLEY CATH W/STRAP (MISCELLANEOUS) ×1 IMPLANT
HOOD PEEL AWAY T7 (MISCELLANEOUS) ×1 IMPLANT
IV NS IRRIG 3000ML ARTHROMATIC (IV SOLUTION) ×1 IMPLANT
KIT TURNOVER KIT A (KITS) ×1 IMPLANT
KNIFE SCULPS 14X20 (INSTRUMENTS) ×1 IMPLANT
MANIFOLD NEPTUNE II (INSTRUMENTS) ×2 IMPLANT
NDL SPNL 20GX3.5 QUINCKE YW (NEEDLE) ×2 IMPLANT
NEEDLE SPNL 20GX3.5 QUINCKE YW (NEEDLE) ×2
PACK TOTAL KNEE (MISCELLANEOUS) ×1 IMPLANT
PAD ABD DERMACEA PRESS 5X9 (GAUZE/BANDAGES/DRESSINGS) ×2 IMPLANT
PAD ARMBOARD 7.5X6 YLW CONV (MISCELLANEOUS) ×3 IMPLANT
PAD WRAPON POLAR KNEE (MISCELLANEOUS) ×1 IMPLANT
PENCIL SMOKE EVACUATOR COATED (MISCELLANEOUS) ×1 IMPLANT
PIN DRILL FIX HALF THREAD (BIT) ×2 IMPLANT
PIN FIXATION 1/8DIA X 3INL (PIN) ×1 IMPLANT
PULSAVAC PLUS IRRIG FAN TIP (DISPOSABLE) ×1
SOLUTION IRRIG SURGIPHOR (IV SOLUTION) ×1 IMPLANT
SPONGE DRAIN TRACH 4X4 STRL 2S (GAUZE/BANDAGES/DRESSINGS) ×1 IMPLANT
STAPLER SKIN PROX 35W (STAPLE) ×1 IMPLANT
STOCKINETTE STRL BIAS CUT 8X4 (MISCELLANEOUS) ×1 IMPLANT
STRAP TIBIA SHORT (MISCELLANEOUS) ×1 IMPLANT
SUCTION TUBE FRAZIER 10FR DISP (SUCTIONS) ×1 IMPLANT
SUT VIC AB 0 CT1 36 (SUTURE) ×1 IMPLANT
SUT VIC AB 1 CT1 36 (SUTURE) ×2 IMPLANT
SUT VIC AB 2-0 CT2 27 (SUTURE) ×1 IMPLANT
SYR 30ML LL (SYRINGE) ×2 IMPLANT
TIBIAL BASE ATTUNE KNEE SZ9 (Knees) ×1 IMPLANT
TIP FAN IRRIG PULSAVAC PLUS (DISPOSABLE) ×1 IMPLANT
TOWEL OR 17X26 4PK STRL BLUE (TOWEL DISPOSABLE) ×1 IMPLANT
TOWER CARTRIDGE SMART MIX (DISPOSABLE) ×1 IMPLANT
TRAP FLUID SMOKE EVACUATOR (MISCELLANEOUS) ×1 IMPLANT
TRAY FOLEY MTR SLVR 16FR STAT (SET/KITS/TRAYS/PACK) ×1 IMPLANT
WATER STERILE IRR 1000ML POUR (IV SOLUTION) ×1 IMPLANT
WRAPON POLAR PAD KNEE (MISCELLANEOUS) ×1

## 2023-04-24 NOTE — Anesthesia Procedure Notes (Signed)
Spinal  Patient location during procedure: OR Start time: 04/24/2023 11:48 AM End time: 04/24/2023 11:52 AM Reason for block: surgical anesthesia Staffing Performed: anesthesiologist  Anesthesiologist: Reed Breech, MD Resident/CRNA: Irving Burton, CRNA Performed by: Irving Burton, CRNA Authorized by: Reed Breech, MD   Preanesthetic Checklist Completed: patient identified, IV checked, site marked, risks and benefits discussed, surgical consent, monitors and equipment checked, pre-op evaluation and timeout performed Spinal Block Patient position: sitting Prep: ChloraPrep Patient monitoring: heart rate, continuous pulse ox, blood pressure and cardiac monitor Approach: midline Location: L3-4 Injection technique: single-shot Needle Needle type: Introducer and Pencan  Needle gauge: 24 G Needle length: 9 cm Assessment Sensory level: T10 Events: CSF return Additional Notes Sterile aseptic technique used throughout the procedure.  Negative paresthesia. Negative blood return. Positive free-flowing CSF. Expiration date of kit checked and confirmed. Patient tolerated procedure well, without complications.

## 2023-04-24 NOTE — Plan of Care (Signed)
Education progressing as documented.

## 2023-04-24 NOTE — Transfer of Care (Signed)
Immediate Anesthesia Transfer of Care Note  Patient: Evan Giles  Procedure(s) Performed: COMPUTER ASSISTED TOTAL KNEE ARTHROPLASTY (Right: Knee)  Patient Location: PACU  Anesthesia Type:General  Level of Consciousness: awake, alert , and oriented  Airway & Oxygen Therapy: Patient Spontanous Breathing  Post-op Assessment: Report given to RN and Post -op Vital signs reviewed and stable  Post vital signs: Reviewed and stable  Last Vitals:  Vitals Value Taken Time  BP 118/87 04/24/23 1517  Temp 36.7 C 04/24/23 1515  Pulse 88 04/24/23 1517  Resp 15 04/24/23 1517  SpO2 93 % 04/24/23 1517    Last Pain:  Vitals:   04/24/23 0937  TempSrc: Temporal  PainSc: 6          Complications: No notable events documented.

## 2023-04-24 NOTE — Anesthesia Preprocedure Evaluation (Addendum)
Anesthesia Evaluation  Patient identified by MRN, date of birth, ID band Patient awake    Reviewed: Allergy & Precautions, NPO status , Patient's Chart, lab work & pertinent test results  History of Anesthesia Complications Negative for: history of anesthetic complications  Airway Mallampati: IV   Neck ROM: Full    Dental  (+) Implants   Pulmonary    Pulmonary exam normal breath sounds clear to auscultation       Cardiovascular hypertension, Normal cardiovascular exam Rhythm:Regular Rate:Normal  ECG 04/05/23: normal   Neuro/Psych HOH  Neuromuscular disease (neuropathy)    GI/Hepatic PUD,,,  Endo/Other  Obesity; prediabetes  Renal/GU negative Renal ROS     Musculoskeletal  (+) Arthritis ,    Abdominal   Peds  Hematology negative hematology ROS (+)   Anesthesia Other Findings   Reproductive/Obstetrics                             Anesthesia Physical Anesthesia Plan  ASA: 2  Anesthesia Plan: General and Spinal   Post-op Pain Management:    Induction: Intravenous  PONV Risk Score and Plan: 2 and Propofol infusion, TIVA, Treatment may vary due to age or medical condition and Ondansetron  Airway Management Planned: Natural Airway and Nasal Cannula  Additional Equipment:   Intra-op Plan:   Post-operative Plan:   Informed Consent: I have reviewed the patients History and Physical, chart, labs and discussed the procedure including the risks, benefits and alternatives for the proposed anesthesia with the patient or authorized representative who has indicated his/her understanding and acceptance.       Plan Discussed with: CRNA  Anesthesia Plan Comments: (Plan for spinal and GA with natural airway, LMA/GETA backup.  Patient consented for risks of anesthesia including but not limited to:  - adverse reactions to medications - damage to eyes, teeth, lips or other oral mucosa -  nerve damage due to positioning  - sore throat or hoarseness - headache, bleeding, infection, nerve damage 2/2 spinal - damage to heart, brain, nerves, lungs, other parts of body or loss of life  Informed patient about role of CRNA in peri- and intra-operative care.  Patient voiced understanding.)       Anesthesia Quick Evaluation

## 2023-04-24 NOTE — Interval H&P Note (Signed)
History and Physical Interval Note:  04/24/2023 11:19 AM  Evan Giles  has presented today for surgery, with the diagnosis of PRIMARY OSTEOARTHRITIS OF RIGHT KNEE..  The various methods of treatment have been discussed with the patient and family. After consideration of risks, benefits and other options for treatment, the patient has consented to  Procedure(s): COMPUTER ASSISTED TOTAL KNEE ARTHROPLASTY (Right) as a surgical intervention.  The patient's history has been reviewed, patient examined, no change in status, stable for surgery.  I have reviewed the patient's chart and labs.  Questions were answered to the patient's satisfaction.     Anisa Leanos P Skarleth Delmonico

## 2023-04-24 NOTE — Progress Notes (Signed)
PT Cancellation Note  Patient Details Name: Evan Giles MRN: [id number] DOB: October 14, 1940   Cancelled Treatment:    Reason Eval/Treat Not Completed: Patient not medically ready.  Per nursing patient currently lacking return of LE strength and sensation and not appropriate to participate with PT services at this time.  Will attempt to see pt at a future date/time as medically appropriate.     Ovidio Hanger PT, DPT 04/24/23, 4:23 PM

## 2023-04-24 NOTE — Op Note (Signed)
OPERATIVE NOTE  DATE OF SURGERY:  04/24/2023  PATIENT NAME:  Evan Giles   DOB: 22-Feb-1941  MRN: [id number]  PRE-OPERATIVE DIAGNOSIS: Degenerative arthrosis of the right knee, primary  POST-OPERATIVE DIAGNOSIS:  Same  PROCEDURE:  Right total knee arthroplasty using computer-assisted navigation  SURGEON:  Jena Gauss. M.D.  ASSISTANT:  Gean Birchwood, PA-C (present and scrubbed throughout the case, critical for assistance with exposure, retraction, instrumentation, and closure)  ANESTHESIA: spinal  ESTIMATED BLOOD LOSS: 50 mL  FLUIDS REPLACED: 1000 mL of crystalloid  TOURNIQUET TIME: 85 minutes  DRAINS: 2 medium Hemovac drains  SOFT TISSUE RELEASES: Anterior cruciate ligament, posterior cruciate ligament, deep and superficial medial collateral ligament, patellofemoral ligament  IMPLANTS UTILIZED: DePuy Attune size 8 posterior stabilized femoral component (cemented), size 9 rotating platform tibial component (cemented), 41 mm medialized dome patella (cemented), and a 5 mm stabilized rotating platform polyethylene insert.  INDICATIONS FOR SURGERY: Evan Giles is a 82 y.o. year old male with a long history of progressive knee pain. X-rays demonstrated severe degenerative changes in tricompartmental fashion. The patient had not seen any significant improvement despite conservative nonsurgical intervention. After discussion of the risks and benefits of surgical intervention, the patient expressed understanding of the risks benefits and agree with plans for total knee arthroplasty.   The risks, benefits, and alternatives were discussed at length including but not limited to the risks of infection, bleeding, nerve injury, stiffness, blood clots, the need for revision surgery, cardiopulmonary complications, among others, and they were willing to proceed.  PROCEDURE IN DETAIL: The patient was brought into the operating room and, after adequate spinal anesthesia was  achieved, a tourniquet was placed on the patient's upper thigh. The patient's knee and leg were cleaned and prepped with alcohol and DuraPrep and draped in the usual sterile fashion. A "timeout" was performed as per usual protocol. The lower extremity was exsanguinated using an Esmarch, and the tourniquet was inflated to 300 mmHg. An anterior longitudinal incision was made followed by a standard mid vastus approach. The deep fibers of the medial collateral ligament were elevated in a subperiosteal fashion off of the medial flare of the tibia so as to maintain a continuous soft tissue sleeve. The patella was subluxed laterally and the patellofemoral ligament was incised. Inspection of the knee demonstrated severe degenerative changes with full-thickness loss of articular cartilage. Osteophytes were debrided using a rongeur. Anterior and posterior cruciate ligaments were excised. Two 4.0 mm Schanz pins were inserted in the femur and into the tibia for attachment of the array of trackers used for computer-assisted navigation. Hip center was identified using a circumduction technique. Distal landmarks were mapped using the computer. The distal femur and proximal tibia were mapped using the computer. The distal femoral cutting guide was positioned using computer-assisted navigation so as to achieve a 5 distal valgus cut. The femur was sized and it was felt that a size 8 femoral component was appropriate. A size 8 femoral cutting guide was positioned and the anterior cut was performed and verified using the computer. This was followed by completion of the posterior and chamfer cuts. Femoral cutting guide for the central box was then positioned in the center box cut was performed.  Attention was then directed to the proximal tibia. Medial and lateral menisci were excised. The extramedullary tibial cutting guide was positioned using computer-assisted navigation so as to achieve a 0 varus-valgus alignment and 3  posterior slope. The cut was performed and verified using the computer. The  proximal tibia was sized and it was felt that a size 9 tibial tray was appropriate. Tibial and femoral trials were inserted followed by insertion of a 5 mm polyethylene insert. The knee was felt to be tight medially. A Cobb elevator was used to elevate the superficial fibers of the medial collateral ligament.  This allowed for excellent mediolateral soft tissue balancing both in flexion and in full extension. Finally, the patella was cut and prepared so as to accommodate a 41 mm medialized dome patella. A patella trial was placed and the knee was placed through a range of motion with excellent patellar tracking appreciated. The femoral trial was removed after debridement of posterior osteophytes. The central post-hole for the tibial component was reamed followed by insertion of a keel punch. Tibial trials were then removed. Cut surfaces of bone were irrigated with copious amounts of normal saline using pulsatile lavage and then suctioned dry. Polymethylmethacrylate cement with gentamicin was prepared in the usual fashion using a vacuum mixer. Cement was applied to the cut surface of the proximal tibia as well as along the undersurface of a size 9 rotating platform tibial component. Tibial component was positioned and impacted into place. Excess cement was removed using Personal assistant. Cement was then applied to the cut surfaces of the femur as well as along the posterior flanges of the size 8 femoral component. The femoral component was positioned and impacted into place. Excess cement was removed using Personal assistant. A 5 mm polyethylene trial was inserted and the knee was brought into full extension with steady axial compression applied. Finally, cement was applied to the backside of a 41 mm medialized dome patella and the patellar component was positioned and patellar clamp applied. Excess cement was removed using Personal assistant. After  adequate curing of the cement, the tourniquet was deflated after a total tourniquet time of 85 minutes. Hemostasis was achieved using electrocautery. The knee was irrigated with copious amounts of normal saline using pulsatile lavage followed by 450 ml of Surgiphor and then suctioned dry. 20 mL of 1.3% Exparel and 60 mL of 0.25% Marcaine in 40 mL of normal saline was injected along the posterior capsule, medial and lateral gutters, and along the arthrotomy site. A 5 mm stabilized rotating platform polyethylene insert was inserted and the knee was placed through a range of motion with excellent mediolateral soft tissue balancing appreciated and excellent patellar tracking noted. 2 medium drains were placed in the wound bed and brought out through separate stab incisions. The medial parapatellar portion of the incision was reapproximated using interrupted sutures of #1 Vicryl. Subcutaneous tissue was approximated in layers using first #0 Vicryl followed #2-0 Vicryl. The skin was approximated with skin staples. A sterile dressing was applied.  The patient tolerated the procedure well and was transported to the recovery room in stable condition.    Evan Giles., M.D.

## 2023-04-24 NOTE — Progress Notes (Signed)
Patient is not able to walk the distance required to go the bathroom, or he/she is unable to safely negotiate stairs required to access the bathroom.  A 3in1 BSC will alleviate this problem   Liliann File P. Brighton [place]ley, Jr. M.D.  

## 2023-04-25 ENCOUNTER — Encounter: Payer: Self-pay | Admitting: Orthopedic Surgery

## 2023-04-25 DIAGNOSIS — Z7982 Long term (current) use of aspirin: Secondary | ICD-10-CM | POA: Diagnosis not present

## 2023-04-25 DIAGNOSIS — Z96652 Presence of left artificial knee joint: Secondary | ICD-10-CM | POA: Diagnosis not present

## 2023-04-25 DIAGNOSIS — I10 Essential (primary) hypertension: Secondary | ICD-10-CM | POA: Diagnosis not present

## 2023-04-25 DIAGNOSIS — Z79899 Other long term (current) drug therapy: Secondary | ICD-10-CM | POA: Diagnosis not present

## 2023-04-25 DIAGNOSIS — M1711 Unilateral primary osteoarthritis, right knee: Secondary | ICD-10-CM | POA: Diagnosis not present

## 2023-04-25 MED ORDER — METOCLOPRAMIDE HCL 10 MG PO TABS
ORAL_TABLET | ORAL | Status: AC
Start: 2023-04-25 — End: ?
  Filled 2023-04-25: qty 1

## 2023-04-25 MED ORDER — ASPIRIN EC 81 MG PO TBEC: 81.0000 mg | DELAYED_RELEASE_TABLET | Freq: Two times a day (BID) | ORAL | Status: AC

## 2023-04-25 MED ORDER — ACETAMINOPHEN 10 MG/ML IV SOLN
INTRAVENOUS | Status: AC
  Filled 2023-04-25: qty 100

## 2023-04-25 MED ORDER — LOSARTAN POTASSIUM 50 MG PO TABS
ORAL_TABLET | ORAL | Status: AC
  Filled 2023-04-25: qty 1

## 2023-04-25 MED ORDER — SODIUM CHLORIDE 0.9 % IV BOLUS
500.0000 mL | Freq: Once | INTRAVENOUS | Status: AC
  Administered 2023-04-25: 500 mL via INTRAVENOUS

## 2023-04-25 MED ORDER — OXYCODONE HCL 5 MG PO TABS
ORAL_TABLET | ORAL | Status: AC
  Filled 2023-04-25: qty 2

## 2023-04-25 MED ORDER — SENNOSIDES-DOCUSATE SODIUM 8.6-50 MG PO TABS
ORAL_TABLET | ORAL | Status: AC
  Filled 2023-04-25: qty 1

## 2023-04-25 MED ORDER — TRAMADOL HCL 50 MG PO TABS: 50.0000 mg | ORAL_TABLET | ORAL | 0 refills | Status: DC | PRN

## 2023-04-25 MED ORDER — ATORVASTATIN CALCIUM 20 MG PO TABS
ORAL_TABLET | ORAL | Status: AC
Start: 2023-04-25 — End: ?
  Filled 2023-04-25: qty 1

## 2023-04-25 MED ORDER — CELECOXIB 200 MG PO CAPS: 200.0000 mg | ORAL_CAPSULE | Freq: Two times a day (BID) | ORAL | 1 refills | Status: DC

## 2023-04-25 MED ORDER — MAGNESIUM HYDROXIDE 400 MG/5ML PO SUSP
ORAL | Status: AC
Start: 2023-04-25 — End: ?
  Filled 2023-04-25: qty 30

## 2023-04-25 MED ORDER — CELECOXIB 200 MG PO CAPS
ORAL_CAPSULE | ORAL | Status: AC
  Filled 2023-04-25: qty 1

## 2023-04-25 MED ORDER — ASPIRIN 81 MG PO CHEW
CHEWABLE_TABLET | ORAL | Status: AC
  Filled 2023-04-25: qty 1

## 2023-04-25 MED ORDER — OXYCODONE HCL 5 MG PO TABS: 5.0000 mg | ORAL_TABLET | ORAL | 0 refills | Status: DC | PRN

## 2023-04-25 MED ORDER — FERROUS SULFATE 325 (65 FE) MG PO TABS
ORAL_TABLET | ORAL | Status: AC
  Filled 2023-04-25: qty 1

## 2023-04-25 MED ORDER — PANTOPRAZOLE SODIUM 40 MG PO TBEC
DELAYED_RELEASE_TABLET | ORAL | Status: AC
  Filled 2023-04-25: qty 1

## 2023-04-25 MED ORDER — METOCLOPRAMIDE HCL 10 MG PO TABS
ORAL_TABLET | ORAL | Status: AC
  Filled 2023-04-25: qty 1

## 2023-04-25 NOTE — Discharge Summary (Signed)
 Physician Discharge Summary  Subjective: 1 Day Post-Op Procedure(s) (LRB): COMPUTER ASSISTED TOTAL KNEE ARTHROPLASTY (Right) Patient reports pain as mild.   Patient seen in rounds with Dr. Mardee. Patient is well, and has had no acute complaints or problems. Denies any CP, SOB, N/V, fevers or chills We will start therapy today.  Patient is ready to go home  Physician Discharge Summary  Patient ID: Evan Giles MRN: [id number] DOB/AGE: 08/09/1940 82 y.o.  Admit date: 04/24/2023 Discharge date: 04/25/2023  Admission Diagnoses:  Discharge Diagnoses:  Principal Problem:   History of total knee arthroplasty, right   Discharged Condition: good  Hospital Course: Patient presented to the hospital on 04/24/2023 for an elective right total knee arthroplasty performed by Dr. Mardee. Patient was given 1g of TXA and 2g of Ancef  prior to the procedure. he  tolerated the procedure well without any complications. See procedural note below for details. Postoperatively, the patient did very well. he  was able to pass PT protocols on post-op day one without any issues. JP drain was removed without any difficulty and was intact. he  was able to void his bladder without any difficulty. Physical exam was unremarkable. he  denies any SOB, CP, N/V, fevers or chills. Vital signs are stable. Patient is stable to discharge home.   Treatments: None  Discharge Exam: Blood pressure (!) 121/52, pulse 69, temperature 97.9 F (36.6 C), resp. rate 16, height 6' (1.829 m), weight 113.3 kg, SpO2 100%.   Disposition: Home   Allergies as of 04/25/2023       Reactions   Hydrocodone  Itching        Medication List     TAKE these medications    acetaminophen  500 MG tablet Commonly known as: TYLENOL  Take 1,000 mg by mouth every 6 (six) hours as needed for moderate pain (pain score 4-6).   aspirin  EC 81 MG tablet Take 1 tablet (81 mg total) by mouth 2 (two) times daily. What changed: when to take  this   atorvastatin  20 MG tablet Commonly known as: LIPITOR TAKE 1 TABLET BY MOUTH DAILY   CALCIUM  + D PO Take 1 tablet by mouth daily.   celecoxib  200 MG capsule Commonly known as: CELEBREX  Take 1 capsule (200 mg total) by mouth 2 (two) times daily.   fluconazole 200 MG tablet Commonly known as: DIFLUCAN Take 200 mg by mouth once a week.   hydrocortisone cream 1 % Apply 1 Application topically daily as needed for itching.   ketoconazole  2 % cream Commonly known as: NIZORAL  Apply 1 Application topically daily as needed for irritation.   losartan  50 MG tablet Commonly known as: COZAAR  TAKE 1 TABLET BY MOUTH DAILY   Magnesium  250 MG Tabs Take 250 mg by mouth daily.   oxyCODONE  5 MG immediate release tablet Commonly known as: Oxy IR/ROXICODONE  Take 1 tablet (5 mg total) by mouth every 4 (four) hours as needed for moderate pain (pain score 4-6) (pain score 4-6).   tamsulosin  0.4 MG Caps capsule Commonly known as: FLOMAX  Take 0.8 mg by mouth at bedtime.   traMADol  50 MG tablet Commonly known as: ULTRAM  Take 1-2 tablets (50-100 mg total) by mouth every 4 (four) hours as needed for moderate pain (pain score 4-6).               Durable Medical Equipment  (From admission, onward)           Start     Ordered   04/24/23 1605  DME  Walker rolling  Once       Question:  Patient needs a walker to treat with the following condition  Answer:  Total knee replacement status   04/24/23 1605   04/24/23 1605  DME Bedside commode  Once       Comments: Patient is not able to walk the distance required to go the bathroom, or he/she is unable to safely negotiate stairs required to access the bathroom.  A 3in1 BSC will alleviate this problem  Question:  Patient needs a bedside commode to treat with the following condition  Answer:  Total knee replacement status   04/24/23 1605            Follow-up Information     Drake Chew, PA-C Follow up on 05/09/2023.    Specialty: Orthopedic Surgery Why: at 9:45am Contact information: [address] [place] [address] [phone]         Mardee Lynwood SQUIBB, MD Follow up on 06/06/2023.   Specialty: Orthopedic Surgery Why: at 2:30pm Contact information: [address] [facility] [place] [address] [phone]                 Signed: Sidra Drake 04/25/2023, 8:40 AM   Objective: Vital signs in last 24 hours: Temp:  [97.4 F (36.3 C)-98.7 F (37.1 C)] 97.9 F (36.6 C) (12/31 0644) Pulse Rate:  [56-93] 69 (12/31 0644) Resp:  [13-18] 16 (12/31 0644) BP: (118-146)/(47-87) 121/52 (12/31 0644) SpO2:  [93 %-100 %] 100 % (12/31 0644) Weight:  [113.3 kg] 113.3 kg (12/30 1530)  Intake/Output from previous day:  Intake/Output Summary (Last 24 hours) at 04/25/2023 0840 Last data filed at 04/25/2023 0639 Gross per 24 hour  Intake 2140 ml  Output 1115 ml  Net 1025 ml    Intake/Output this shift: No intake/output data recorded.  Labs: No results for input(s): HGB in the last 72 hours. No results for input(s): WBC, RBC, HCT, PLT in the last 72 hours. No results for input(s): NA, K, CL, CO2, BUN, CREATININE, GLUCOSE, CALCIUM  in the last 72 hours. No results for input(s): LABPT, INR in the last 72 hours.  EXAM: General - Patient is Alert, Appropriate, and Oriented Extremity - Neurologically intact ABD soft Neurovascular intact Sensation intact distally Intact pulses distally Dorsiflexion/Plantar flexion intact No cellulitis present Compartment soft Dressing - dressing C/D/I and no drainage Motor Function - intact, moving foot and toes well on exam.  Able to plantar dorsiflex with good strength and range of motion.  Is neurovascular intact all dermatomes on his right lower extremity.  Posterior tibial pulses appreciated JP Drain pulled without difficulty. Intact  Assessment/Plan: 1 Day Post-Op Procedure(s)  (LRB): COMPUTER ASSISTED TOTAL KNEE ARTHROPLASTY (Right) Procedure(s) (LRB): COMPUTER ASSISTED TOTAL KNEE ARTHROPLASTY (Right) Past Medical History:  Diagnosis Date   Diverticulitis    Hard of hearing    Heart murmur    as a child   Hyperlipidemia    Hypertension    Lumbar stenosis with neurogenic claudication    Osteoarthrosis of knee    Peptic ulcer    years ago   Pre-diabetes    Wears hearing aid in both ears    Principal Problem:   History of total knee arthroplasty, right  Estimated body mass index is 33.88 kg/m as calculated from the following:   Height as of this encounter: 6' (1.829 m).   Weight as of this encounter: 113.3 kg.   Patient will continue to work with physical therapy to  pass postoperative PT protocols, ROM and strengthening   Discussed with the patient continuing to utilize Polar Care   Patient will use bone foam in 20-30 minute intervals   Patient will wear TED hose bilaterally to help prevent DVT and clot formation   Discussed the Aquacel bandage.  This bandage will stay in place 7 days postoperatively.  Can be replaced with honeycomb bandages that will be sent home with the patient   Discussed sending the patient home with tramadol  and oxycodone  for as needed pain management.  Patient will also be sent home with Celebrex  to help with swelling and inflammation.  Patient will take an 81 mg aspirin  twice daily for DVT prophylaxis   JP drain removed without difficulty, intact   Weight-Bearing as tolerated to right leg   Patient will follow-up with [facility] orthopedics in 2 weeks for staple removal and reevaluation  Diet - Regular diet Follow up - in 2 weeks Activity - WBAT Disposition - Home Condition Upon Discharge - Good DVT Prophylaxis - Aspirin  and TED hose  Fonda CHARLENA Koyanagi, PA-C Orthopaedic Surgery 04/25/2023, 8:40 AM

## 2023-04-25 NOTE — Evaluation (Signed)
 Physical Therapy Evaluation Patient Details Name: Evan Giles MRN: [id number] DOB: October 10, 1940 Today's Date: 04/25/2023  History of Present Illness  Pt is an 82 y.o. male who presents s/p elective R TKA. PMH includes HLD, HTN, and ACDF C4-6.  Clinical Impression  Pt was pleasant and motivated to participate during the session and put forth good effort throughout. Pt required no physical assistance during the session and presented with good stability with transfers, gait, and stair training.  Pt was able to amb 2 x 125' with step-through pattern with only minimally antalgic gait on the RLE and was steady ascending backwards and descending forwards with a RW.   Pt's spouse present and participated during stair training with both pt and spouse stating they feel comfortable with managing at home with assistance from son as well.  Pt reported no adverse symptoms during the session and stated no questions or concerns related to discharge at the end of the session.  Pt will benefit from continued PT services upon discharge to safely address deficits listed in patient problem list for decreased caregiver assistance and eventual return to PLOF.          If plan is discharge home, recommend the following: A little help with walking and/or transfers;A little help with bathing/dressing/bathroom;Assistance with cooking/housework;Help with stairs or ramp for entrance;Assist for transportation   Can travel by private vehicle        Equipment Recommendations Rolling walker (2 wheels)  Recommendations for Other Services       Functional Status Assessment Patient has had a recent decline in their functional status and demonstrates the ability to make significant improvements in function in a reasonable and predictable amount of time.     Precautions / Restrictions Precautions Precautions: Fall Restrictions Weight Bearing Restrictions Per Provider Order: Yes RLE Weight Bearing Per Provider Order:  Weight bearing as tolerated Other Position/Activity Restrictions: Pt able to perform Ind RLE SLR, no KI required      Mobility  Bed Mobility               General bed mobility comments: NT, pt in recliner    Transfers Overall transfer level: Needs assistance Equipment used: Rolling walker (2 wheels) Transfers: Sit to/from Stand Sit to Stand: Contact guard assist           General transfer comment: Min verbal and visual cues for sequencing    Ambulation/Gait Ambulation/Gait assistance: Contact guard assist Gait Distance (Feet): 125 Feet x 2 Assistive device: Rolling walker (2 wheels) Gait Pattern/deviations: Step-through pattern, Decreased step length - left, Decreased stance time - right, Antalgic Gait velocity: decreased     General Gait Details: Mildly antalgic gait pattern on the RLE but steady with no overt LOB  Stairs Stairs: Yes Stairs assistance: Contact guard assist Stair Management: Backwards, Forwards, With walker Number of Stairs: 4 General stair comments: Spouse present for training and assisted; mod visual and verbal cues for sequencing with good carryover between patient and spouse; steady with no overt LOB or buckling  Wheelchair Mobility     Tilt Bed    Modified Rankin (Stroke Patients Only)       Balance     Sitting balance-Leahy Scale: Good     Standing balance support: Reliant on assistive device for balance, During functional activity, Bilateral upper extremity supported Standing balance-Leahy Scale: Good  Pertinent Vitals/Pain Pain Assessment Pain Assessment: 0-10 Pain Score: 5  Pain Location: R knee Pain Descriptors / Indicators: Aching, Sore Pain Intervention(s): Premedicated before session, Repositioned, Monitored during session, Ice applied    Home Living Family/patient expects to be discharged to:: Private residence Living Arrangements: Spouse/significant other Available  Help at Discharge: Family;Available 24 hours/day Type of Home: House Home Access: Stairs to enter Entrance Stairs-Rails: None Entrance Stairs-Number of Steps: 4   Home Layout: One level Home Equipment: Rollator (4 wheels);Cane - single point      Prior Function Prior Level of Function : Independent/Modified Independent             Mobility Comments: Ind amb in the home without AD, SPC in the community, no fall history ADLs Comments: Ind with ADLs     Extremity/Trunk Assessment   Upper Extremity Assessment Upper Extremity Assessment: Overall WFL for tasks assessed    Lower Extremity Assessment Lower Extremity Assessment: Generalized weakness;RLE deficits/detail RLE Deficits / Details: RLE hip flex strength >/= 3/5 RLE: Unable to fully assess due to pain RLE Coordination: WNL       Communication   Communication Communication: Hearing impairment Cueing Techniques: Verbal cues;Visual cues;Tactile cues  Cognition Arousal: Alert Behavior During Therapy: WFL for tasks assessed/performed Overall Cognitive Status: Within Functional Limits for tasks assessed                                          General Comments      Exercises Total Joint Exercises Quad Sets: AROM, Strengthening, Right, 5 reps, 10 reps Straight Leg Raises: AROM, Strengthening, Right, 5 reps Long Arc Quad: AROM, Strengthening, Right, 5 reps, 10 reps Knee Flexion: AROM, Strengthening, Right, 5 reps, 10 reps Goniometric ROM: R knee AROM: 4-70 deg Other Exercises Other Exercises: RLE positioning education to promote R knee ext PROM Other Exercises: Car transfer sequencing education Other Exercises: HEP education per handout with emphasis on RLE QS and seated knee flex   Assessment/Plan    PT Assessment Patient needs continued PT services  PT Problem List Decreased strength;Decreased range of motion;Decreased activity tolerance;Decreased balance;Decreased mobility;Decreased  knowledge of use of DME;Pain       PT Treatment Interventions DME instruction;Gait training;Stair training;Functional mobility training;Therapeutic activities;Therapeutic exercise;Balance training;Patient/family education    PT Goals (Current goals can be found in the Care Plan section)  Acute Rehab PT Goals Patient Stated Goal: To return home and walk without pain PT Goal Formulation: With patient Time For Goal Achievement: 05/08/23 Potential to Achieve Goals: Good    Frequency BID     Co-evaluation               AM-PAC PT 6 Clicks Mobility  Outcome Measure Help needed turning from your back to your side while in a flat bed without using bedrails?: None Help needed moving from lying on your back to sitting on the side of a flat bed without using bedrails?: None Help needed moving to and from a bed to a chair (including a wheelchair)?: A Little Help needed standing up from a chair using your arms (e.g., wheelchair or bedside chair)?: A Little Help needed to walk in hospital room?: A Little Help needed climbing 3-5 steps with a railing? : A Little 6 Click Score: 20    End of Session Equipment Utilized During Treatment: Gait belt Activity Tolerance: Patient tolerated treatment well Patient left: in  chair;with call bell/phone within reach;with family/visitor present Nurse Communication: Mobility status PT Visit Diagnosis: Other abnormalities of gait and mobility (R26.89);Muscle weakness (generalized) (M62.81);Pain Pain - Right/Left: Right    Time: 1330-1408 PT Time Calculation (min) (ACUTE ONLY): 38 min   Charges:   PT Evaluation $PT Eval Moderate Complexity: 1 Mod PT Treatments $Gait Training: 8-22 mins $Therapeutic Exercise: 8-22 mins PT General Charges $$ ACUTE PT VISIT: 1 Visit       D. Glendia Bertin PT, DPT 04/25/23, 2:51 PM

## 2023-04-25 NOTE — Progress Notes (Signed)
 Subjective: 1 Day Post-Op Procedure(s) (LRB): COMPUTER ASSISTED TOTAL KNEE ARTHROPLASTY (Right) Patient reports pain as mild.   Patient seen in rounds with Dr. Mardee. Patient is well, and has had no acute complaints or problems. Denies any CP, SOB, N/V, fevers or chills We will start therapy today.  Plan is to go Home after hospital stay.  Objective: Vital signs in last 24 hours: Temp:  [97.4 F (36.3 C)-98.7 F (37.1 C)] 97.9 F (36.6 C) (12/31 0644) Pulse Rate:  [56-93] 69 (12/31 0644) Resp:  [13-18] 16 (12/31 0644) BP: (118-146)/(47-87) 121/52 (12/31 0644) SpO2:  [93 %-100 %] 100 % (12/31 0644) Weight:  [113.3 kg] 113.3 kg (12/30 1530)  Intake/Output from previous day:  Intake/Output Summary (Last 24 hours) at 04/25/2023 0806 Last data filed at 04/25/2023 9360 Gross per 24 hour  Intake 2140 ml  Output 1115 ml  Net 1025 ml    Intake/Output this shift: No intake/output data recorded.  Labs: No results for input(s): HGB in the last 72 hours. No results for input(s): WBC, RBC, HCT, PLT in the last 72 hours. No results for input(s): NA, K, CL, CO2, BUN, CREATININE, GLUCOSE, CALCIUM  in the last 72 hours. No results for input(s): LABPT, INR in the last 72 hours.  EXAM General - Patient is Alert, Appropriate, and Oriented Extremity - Neurologically intact ABD soft Neurovascular intact Sensation intact distally Intact pulses distally Dorsiflexion/Plantar flexion intact No cellulitis present Compartment soft Dressing - dressing C/D/I and no drainage Motor Function - intact, moving foot and toes well on exam.  Able to plantar dorsiflex with good strength and range of motion.  Is neurovascular intact all dermatomes on his right lower extremity.  Posterior tibial pulses appreciated JP Drain pulled without difficulty. Intact  Past Medical History:  Diagnosis Date   Diverticulitis    Hard of hearing    Heart murmur    as a child    Hyperlipidemia    Hypertension    Lumbar stenosis with neurogenic claudication    Osteoarthrosis of knee    Peptic ulcer    years ago   Pre-diabetes    Wears hearing aid in both ears     Assessment/Plan: 1 Day Post-Op Procedure(s) (LRB): COMPUTER ASSISTED TOTAL KNEE ARTHROPLASTY (Right) Principal Problem:   History of total knee arthroplasty, right  Estimated body mass index is 33.88 kg/m as calculated from the following:   Height as of this encounter: 6' (1.829 m).   Weight as of this encounter: 113.3 kg. Advance diet Up with therapy  Patient will continue to work with physical therapy to pass postoperative PT protocols, ROM and strengthening  Discussed with the patient continuing to utilize Polar Care  Patient will use bone foam in 20-30 minute intervals  Patient will wear TED hose bilaterally to help prevent DVT and clot formation  Discussed the Aquacel bandage.  This bandage will stay in place 7 days postoperatively.  Can be replaced with honeycomb bandages that will be sent home with the patient  Discussed sending the patient home with tramadol  and oxycodone  for as needed pain management.  Patient will also be sent home with Celebrex  to help with swelling and inflammation.  Patient will take an 81 mg aspirin  twice daily for DVT prophylaxis  JP drain removed without difficulty, intact  Weight-Bearing as tolerated to right leg  Patient will follow-up with [facility] orthopedics in 2 weeks for staple removal and reevaluation  Fonda Koyanagi, PA-C [facility] Orthopaedics 04/25/2023, 8:06 AM

## 2023-04-25 NOTE — Care Management Obs Status (Signed)
 MEDICARE OBSERVATION STATUS NOTIFICATION   Patient Details  Name: KEYTON BHAT MRN: [id number] Date of Birth: 11/08/1940   Medicare Observation Status Notification Given:  Yes    Marlowe Sax, RN 04/25/2023, 11:52 AM

## 2023-04-25 NOTE — Plan of Care (Signed)
  Problem: Skin Integrity: Goal: Risk for impaired skin integrity will decrease Outcome: Progressing   Problem: Activity: Goal: Ability to avoid complications of mobility impairment will improve Outcome: Progressing   Problem: Pain Management: Goal: Pain level will decrease with appropriate interventions Outcome: Progressing   

## 2023-04-25 NOTE — TOC Initial Note (Signed)
 Transition of Care [facility]) - Initial/Assessment Note    Patient Details  Name: Evan Giles MRN: [id number] Date of Birth: [birth year]  [facility] CM/SW Contact:    Royanne JINNY Bernheim, RN Phone Number: 04/25/2023, 8:33 AM  Clinical Narrative:                patient is set up with [facility] prior to surgery by surgeons office, [place] to deliver a 3 in 1 to the bedside prior to DC   Expected Discharge Plan: Home w Home Health Services Barriers to Discharge: No Barriers Identified   Patient Goals and CMS Choice            Expected Discharge Plan and Services   Discharge Planning Services: CM Consult   Living arrangements for the past 2 months: Single Family Home                 DME Arranged: 3-N-1 DME Agency: [employer] Date DME Agency Contacted: 04/25/23 Time DME Agency Contacted: [phone] Representative spoke with at DME Agency: [place] HH Arranged: PT, OT [facility] Agency: [facility] Date [facility] Agency Contacted: 04/25/23 Time HH Agency Contacted: [phone] Representative spoke with at [facility] Agency: [place]   Prior Living Arrangements/Services Living arrangements for the past 2 months: Single Family Home Lives with:: Self, Spouse Patient language and need for interpreter reviewed:: Yes Do you feel safe going back to the place where you live?: Yes      Need for Family Participation in Patient Care: Yes (Comment) Care giver support system in place?: Yes (comment) Current home services: DME (rolling walker) Criminal Activity/Legal Involvement Pertinent to Current Situation/Hospitalization: No - Comment as needed  Activities of Daily Living   ADL Screening (condition at time of admission) Independently performs ADLs?: Yes (appropriate for developmental age) Is the patient deaf or have difficulty hearing?: No Does the patient have difficulty seeing, even when wearing glasses/contacts?: No Does the patient have difficulty concentrating, remembering, or  making decisions?: No  Permission Sought/Granted   Permission granted to share information with : Yes, Verbal Permission Granted              Emotional Assessment Appearance:: Appears stated age   Affect (typically observed): Accepting Orientation: : Oriented to Self, Oriented to Place, Oriented to  Time, Oriented to Situation Alcohol / Substance Use: Not Applicable Psych Involvement: No (comment)  Admission diagnosis:  History of total knee arthroplasty, right [Z96.651] Patient Active Problem List   Diagnosis Date Noted   History of total knee arthroplasty, right 04/24/2023   Primary osteoarthritis of right knee 04/02/2023   Leg pain 02/08/2023   Degenerative tear of glenoid labrum of left shoulder 04/01/2021   Injury of tendon of long head of left biceps 04/01/2021   Rotator cuff tendinitis, left 04/01/2021   Traumatic complete tear of left rotator cuff 04/01/2021   Peripheral neuropathy 08/25/2020   Cervical spondylosis with myelopathy and radiculopathy 11/27/2019   Paresthesia of hand 05/17/2019   BPH with obstruction/lower urinary tract symptoms 08/22/2018   Degenerative lumbar spinal stenosis 06/20/2018   Cervical myelopathy (HCC) 12/20/2017   Advance directive discussed with patient 04/14/2015   Prediabetes 04/04/2013   Routine general medical examination at a health care facility 04/04/2012   Obesity 04/04/2012   Hyperlipidemia    Hypertension    PCP:  Jimmy Charlie FERNS, MD Pharmacy:   [facility] - [place], [place] - [address] [place] [address] Phone: [phone]  Fax: [phone]     Social Drivers of Health (SDOH) Social History: SDOH Screenings   Food Insecurity: No Food Insecurity (04/24/2023)  Housing: Low Risk  (04/24/2023)  Transportation Needs: No Transportation Needs (04/24/2023)  Utilities: Not At Risk (04/24/2023)  Depression (PHQ2-9): Low Risk  (09/06/2022)  Social Connections: Socially Integrated  (04/24/2023)  Tobacco Use: Low Risk  (04/24/2023)   SDOH Interventions:     Readmission Risk Interventions     No data to display

## 2023-04-25 NOTE — Evaluation (Signed)
 Occupational Therapy Evaluation Patient Details Name: Evan Giles MRN: [id number] DOB: 21-Mar-1941 Today's Date: 04/25/2023   History of Present Illness 82 y.o. male who presents POD 1 s/p R TKA. PMH: HLD, HTN, ACDF C4-6.   Clinical Impression   Pt was seen for OT evaluation this date. Prior to hospital admission, pt lives at home with his wife and reports his son will be coming to stay as well to assist.   Pt presents to acute OT demonstrating little to no functional decline/impairment in ADL performance. He reports 6/10 pain in his R knee. Pt currently requires SUP for bed mobility. Needed CGA from low bed surface for STS and SUP from recliner using chair arms. He ambulated to the bathroom and back using RW with CGA/close SBA for safety and for toilet transfer. SUP for hygiene and STS from toilet with BSC over top. Pt performed UB dressing with set up assist seated in recliner and CGA for LB dressing for safety in standing at RW. He needed assist to pull R sock over his heel, otherwise pt performed LB dressing with SUP. Pt was edu on use of polar care system and verbalized understanding. Also edu on use of AE/AD/DME for increased ease/safety/IND with ADL performance with written handout provided to maximize carryover. Pt to be discharged in house and do not anticipate the need for follow up OT services upon acute hospital DC.        If plan is discharge home, recommend the following: A little help with walking and/or transfers;A little help with bathing/dressing/bathroom;Assistance with cooking/housework;Assist for transportation;Help with stairs or ramp for entrance    Functional Status Assessment  Patient has not had a recent decline in their functional status  Equipment Recommendations  BSC/3in1;Other (comment) (RW)    Recommendations for Other Services       Precautions / Restrictions Precautions Precautions: Fall Precaution Comments: s/p R TKA Restrictions Weight Bearing  Restrictions Per Provider Order: Yes RLE Weight Bearing Per Provider Order: Weight bearing as tolerated      Mobility Bed Mobility Overal bed mobility: Needs Assistance Bed Mobility: Supine to Sit     Supine to sit: Supervision          Transfers Overall transfer level: Needs assistance Equipment used: Rolling walker (2 wheels) Transfers: Sit to/from Stand Sit to Stand: Contact guard assist           General transfer comment: CGA from EOB low surface, SUP from recliner using arms      Balance Overall balance assessment: Needs assistance   Sitting balance-Leahy Scale: Good Sitting balance - Comments: steady reaching within BOS for dressing tasks   Standing balance support: Reliant on assistive device for balance, During functional activity, Bilateral upper extremity supported Standing balance-Leahy Scale: Fair Standing balance comment: utilized RW for stablilty during mobility                           ADL either performed or assessed with clinical judgement   ADL Overall ADL's : Needs assistance/impaired                 Upper Body Dressing : Set up;Sitting   Lower Body Dressing: Sitting/lateral leans;Sit to/from stand;Contact guard assist Lower Body Dressing Details (indicate cue type and reason): only needed assist to pull R sock over back of heel, otherwise able to don underwear, pants and socks and shoes on his own with SUP Toilet Transfer: Contact guard assist;BSC/3in1;Regular Toilet;Grab  bars;Rolling walker (2 wheels);Supervision/safety   Toileting- Architect and Hygiene: Supervision/safety;Sit to/from stand;Sitting/lateral lean               Vision         Perception         Praxis         Pertinent Vitals/Pain Pain Assessment Pain Assessment: 0-10 Pain Score: 6  Pain Location: R knee Pain Descriptors / Indicators: Aching, Sore Pain Intervention(s): Monitored during session, Premedicated before session,  Repositioned, Ice applied     Extremity/Trunk Assessment Upper Extremity Assessment Upper Extremity Assessment: Overall WFL for tasks assessed   Lower Extremity Assessment Lower Extremity Assessment: Defer to PT evaluation       Communication Communication Communication: Hearing impairment (HOH has hearing aids) Cueing Techniques: Verbal cues   Cognition Arousal: Alert Behavior During Therapy: WFL for tasks assessed/performed Overall Cognitive Status: Within Functional Limits for tasks assessed                                       General Comments       Exercises Other Exercises Other Exercises: Edu pt on use of AE/AD/DME for increased ease and safety with ADL performance and use of polar care system.   Shoulder Instructions      Home Living Family/patient expects to be discharged to:: Private residence Living Arrangements: Spouse/significant other (son will be staying with pt and wife as well) Available Help at Discharge: Family;Available 24 hours/day Type of Home: House                       Home Equipment: Agricultural Consultant (2 wheels);Cane - single point;Adaptive equipment Adaptive Equipment: Long-handled shoe horn        Prior Functioning/Environment Prior Level of Function : Independent/Modified Independent                        OT Problem List:        OT Treatment/Interventions:      OT Goals(Current goals can be found in the care plan section)    OT Frequency:      Co-evaluation              AM-PAC OT 6 Clicks Daily Activity     Outcome Measure Help from another person eating meals?: None Help from another person taking care of personal grooming?: None Help from another person toileting, which includes using toliet, bedpan, or urinal?: A Little Help from another person bathing (including washing, rinsing, drying)?: A Little Help from another person to put on and taking off regular upper body clothing?:  None Help from another person to put on and taking off regular lower body clothing?: A Little 6 Click Score: 21   End of Session Equipment Utilized During Treatment: Rolling walker (2 wheels) Nurse Communication: Mobility status  Activity Tolerance: Patient tolerated treatment well Patient left: in chair;with call bell/phone within reach  OT Visit Diagnosis: Other abnormalities of gait and mobility (R26.89)                Time: 9161-9090 OT Time Calculation (min): 31 min Charges:  OT General Charges $OT Visit: 1 Visit OT Evaluation $OT Eval Low Complexity: 1 Low OT Treatments $Self Care/Home Management : 8-22 mins Laronica Bhagat, OTR/L 04/25/23, 10:56 AM Jovin Fester E Soraya Paquette 04/25/2023, 10:50 AM

## 2023-04-25 NOTE — Progress Notes (Signed)
 DISCHARGE NOTE:   Pt discharged with instructions and education given. Pt and pt's wife voiced no questions or concerns at this time. Pt given medication scripts, two honeycomb dressing, and TED on and in place. Pt 3 in 1 and walker delivered to pts room at [facility]. Pt wheeled down to medical mall entrance by staff and pt's daughter provided transportation.

## 2023-04-25 NOTE — Plan of Care (Signed)
 documented

## 2023-04-25 NOTE — Progress Notes (Signed)
 PT Cancellation Note  Patient Details Name: KENZIE FLAKES MRN: [id number] DOB: [birth year]   Cancelled Treatment:    Reason Eval/Treat Not Completed: Other (comment): Nursing requesting PT hold this AM secondary to low BP with associated dizziness.  Will attempt to see pt at a future date/time as medically appropriate.    CHARM Glendia Bertin PT, DPT 04/25/23, 10:59 AM

## 2023-04-25 NOTE — Progress Notes (Signed)
Pt sitting in recliner and is c/o  of "feeling dizzy just sitting there". BP 89/50 on right arm, recheck on left on 119/51. MD Hooten notified, orders received for 500 ml NS bolus. Order placed.

## 2023-04-26 DIAGNOSIS — M48062 Spinal stenosis, lumbar region with neurogenic claudication: Secondary | ICD-10-CM | POA: Diagnosis not present

## 2023-04-26 DIAGNOSIS — Z96652 Presence of left artificial knee joint: Secondary | ICD-10-CM | POA: Diagnosis not present

## 2023-04-26 DIAGNOSIS — M47812 Spondylosis without myelopathy or radiculopathy, cervical region: Secondary | ICD-10-CM | POA: Diagnosis not present

## 2023-04-26 DIAGNOSIS — Z791 Long term (current) use of non-steroidal anti-inflammatories (NSAID): Secondary | ICD-10-CM | POA: Diagnosis not present

## 2023-04-26 DIAGNOSIS — M47816 Spondylosis without myelopathy or radiculopathy, lumbar region: Secondary | ICD-10-CM | POA: Diagnosis not present

## 2023-04-26 DIAGNOSIS — Z9181 History of falling: Secondary | ICD-10-CM | POA: Diagnosis not present

## 2023-04-26 DIAGNOSIS — I10 Essential (primary) hypertension: Secondary | ICD-10-CM | POA: Diagnosis not present

## 2023-04-26 DIAGNOSIS — Z96651 Presence of right artificial knee joint: Secondary | ICD-10-CM | POA: Diagnosis not present

## 2023-04-26 DIAGNOSIS — R7303 Prediabetes: Secondary | ICD-10-CM | POA: Diagnosis not present

## 2023-04-26 DIAGNOSIS — H9190 Unspecified hearing loss, unspecified ear: Secondary | ICD-10-CM | POA: Diagnosis not present

## 2023-04-26 DIAGNOSIS — Z7982 Long term (current) use of aspirin: Secondary | ICD-10-CM | POA: Diagnosis not present

## 2023-04-26 DIAGNOSIS — E785 Hyperlipidemia, unspecified: Secondary | ICD-10-CM | POA: Diagnosis not present

## 2023-04-26 DIAGNOSIS — Z471 Aftercare following joint replacement surgery: Secondary | ICD-10-CM | POA: Diagnosis not present

## 2023-04-27 NOTE — Anesthesia Postprocedure Evaluation (Signed)
 Anesthesia Post Note  Patient: Evan Giles  Procedure(s) Performed: COMPUTER ASSISTED TOTAL KNEE ARTHROPLASTY (Right: Knee)  Patient location during evaluation: PACU Anesthesia Type: Spinal Level of consciousness: oriented and awake and alert Pain management: pain level controlled Vital Signs Assessment: post-procedure vital signs reviewed and stable Respiratory status: respiratory function stable Cardiovascular status: blood pressure returned to baseline and stable Postop Assessment: no headache, no backache and no apparent nausea or vomiting Anesthetic complications: no Comments: Patient discharged prior to post-anesthesia evaluation. Nursing and physician notes reviewed, vital signs reviewed, no apparent complications.   No notable events documented.   Last Vitals:  Vitals:   04/25/23 1222 04/25/23 1516  BP: (!) 110/57 (!) 132/53  Pulse: 69 (!) 58  Resp:  15  Temp:  36.8 C  SpO2: 96% 96%    Last Pain:  Vitals:   04/25/23 1516  TempSrc: Oral  PainSc:                  [name]

## 2023-04-29 DIAGNOSIS — Z9181 History of falling: Secondary | ICD-10-CM | POA: Diagnosis not present

## 2023-04-29 DIAGNOSIS — Z96651 Presence of right artificial knee joint: Secondary | ICD-10-CM | POA: Diagnosis not present

## 2023-04-29 DIAGNOSIS — R7303 Prediabetes: Secondary | ICD-10-CM | POA: Diagnosis not present

## 2023-04-29 DIAGNOSIS — I10 Essential (primary) hypertension: Secondary | ICD-10-CM | POA: Diagnosis not present

## 2023-04-29 DIAGNOSIS — Z471 Aftercare following joint replacement surgery: Secondary | ICD-10-CM | POA: Diagnosis not present

## 2023-04-29 DIAGNOSIS — E785 Hyperlipidemia, unspecified: Secondary | ICD-10-CM | POA: Diagnosis not present

## 2023-04-29 DIAGNOSIS — M47816 Spondylosis without myelopathy or radiculopathy, lumbar region: Secondary | ICD-10-CM | POA: Diagnosis not present

## 2023-04-29 DIAGNOSIS — M47812 Spondylosis without myelopathy or radiculopathy, cervical region: Secondary | ICD-10-CM | POA: Diagnosis not present

## 2023-04-29 DIAGNOSIS — H9190 Unspecified hearing loss, unspecified ear: Secondary | ICD-10-CM | POA: Diagnosis not present

## 2023-04-29 DIAGNOSIS — Z791 Long term (current) use of non-steroidal anti-inflammatories (NSAID): Secondary | ICD-10-CM | POA: Diagnosis not present

## 2023-04-29 DIAGNOSIS — Z7982 Long term (current) use of aspirin: Secondary | ICD-10-CM | POA: Diagnosis not present

## 2023-04-29 DIAGNOSIS — M48062 Spinal stenosis, lumbar region with neurogenic claudication: Secondary | ICD-10-CM | POA: Diagnosis not present

## 2023-04-29 DIAGNOSIS — Z96652 Presence of left artificial knee joint: Secondary | ICD-10-CM | POA: Diagnosis not present

## 2023-05-02 DIAGNOSIS — E785 Hyperlipidemia, unspecified: Secondary | ICD-10-CM | POA: Diagnosis not present

## 2023-05-02 DIAGNOSIS — H9190 Unspecified hearing loss, unspecified ear: Secondary | ICD-10-CM | POA: Diagnosis not present

## 2023-05-02 DIAGNOSIS — Z471 Aftercare following joint replacement surgery: Secondary | ICD-10-CM | POA: Diagnosis not present

## 2023-05-02 DIAGNOSIS — I10 Essential (primary) hypertension: Secondary | ICD-10-CM | POA: Diagnosis not present

## 2023-05-02 DIAGNOSIS — M47816 Spondylosis without myelopathy or radiculopathy, lumbar region: Secondary | ICD-10-CM | POA: Diagnosis not present

## 2023-05-02 DIAGNOSIS — Z791 Long term (current) use of non-steroidal anti-inflammatories (NSAID): Secondary | ICD-10-CM | POA: Diagnosis not present

## 2023-05-02 DIAGNOSIS — R7303 Prediabetes: Secondary | ICD-10-CM | POA: Diagnosis not present

## 2023-05-02 DIAGNOSIS — Z96651 Presence of right artificial knee joint: Secondary | ICD-10-CM | POA: Diagnosis not present

## 2023-05-02 DIAGNOSIS — Z9181 History of falling: Secondary | ICD-10-CM | POA: Diagnosis not present

## 2023-05-02 DIAGNOSIS — Z96652 Presence of left artificial knee joint: Secondary | ICD-10-CM | POA: Diagnosis not present

## 2023-05-02 DIAGNOSIS — Z7982 Long term (current) use of aspirin: Secondary | ICD-10-CM | POA: Diagnosis not present

## 2023-05-02 DIAGNOSIS — M48062 Spinal stenosis, lumbar region with neurogenic claudication: Secondary | ICD-10-CM | POA: Diagnosis not present

## 2023-05-02 DIAGNOSIS — M47812 Spondylosis without myelopathy or radiculopathy, cervical region: Secondary | ICD-10-CM | POA: Diagnosis not present

## 2023-05-02 NOTE — Care Management Obs Status (Deleted)
 MEDICARE OBSERVATION STATUS NOTIFICATION   Patient Details  Name: BUCKLEY BRADLY MRN: [id number] Date of Birth: Aug 27, 1940   Medicare Observation Status Notification Given:  Yes    Cherrie Distance, RN 05/02/2023, 12:35 PM

## 2023-05-03 DIAGNOSIS — M48062 Spinal stenosis, lumbar region with neurogenic claudication: Secondary | ICD-10-CM | POA: Diagnosis not present

## 2023-05-03 DIAGNOSIS — Z791 Long term (current) use of non-steroidal anti-inflammatories (NSAID): Secondary | ICD-10-CM | POA: Diagnosis not present

## 2023-05-03 DIAGNOSIS — Z96652 Presence of left artificial knee joint: Secondary | ICD-10-CM | POA: Diagnosis not present

## 2023-05-03 DIAGNOSIS — Z9181 History of falling: Secondary | ICD-10-CM | POA: Diagnosis not present

## 2023-05-03 DIAGNOSIS — H9190 Unspecified hearing loss, unspecified ear: Secondary | ICD-10-CM | POA: Diagnosis not present

## 2023-05-03 DIAGNOSIS — E785 Hyperlipidemia, unspecified: Secondary | ICD-10-CM | POA: Diagnosis not present

## 2023-05-03 DIAGNOSIS — Z471 Aftercare following joint replacement surgery: Secondary | ICD-10-CM | POA: Diagnosis not present

## 2023-05-03 DIAGNOSIS — Z96651 Presence of right artificial knee joint: Secondary | ICD-10-CM | POA: Diagnosis not present

## 2023-05-03 DIAGNOSIS — Z7982 Long term (current) use of aspirin: Secondary | ICD-10-CM | POA: Diagnosis not present

## 2023-05-03 DIAGNOSIS — R7303 Prediabetes: Secondary | ICD-10-CM | POA: Diagnosis not present

## 2023-05-03 DIAGNOSIS — M47812 Spondylosis without myelopathy or radiculopathy, cervical region: Secondary | ICD-10-CM | POA: Diagnosis not present

## 2023-05-03 DIAGNOSIS — M47816 Spondylosis without myelopathy or radiculopathy, lumbar region: Secondary | ICD-10-CM | POA: Diagnosis not present

## 2023-05-03 DIAGNOSIS — I10 Essential (primary) hypertension: Secondary | ICD-10-CM | POA: Diagnosis not present

## 2023-05-05 DIAGNOSIS — Z9181 History of falling: Secondary | ICD-10-CM | POA: Diagnosis not present

## 2023-05-05 DIAGNOSIS — E785 Hyperlipidemia, unspecified: Secondary | ICD-10-CM | POA: Diagnosis not present

## 2023-05-05 DIAGNOSIS — Z96651 Presence of right artificial knee joint: Secondary | ICD-10-CM | POA: Diagnosis not present

## 2023-05-05 DIAGNOSIS — Z7982 Long term (current) use of aspirin: Secondary | ICD-10-CM | POA: Diagnosis not present

## 2023-05-05 DIAGNOSIS — Z471 Aftercare following joint replacement surgery: Secondary | ICD-10-CM | POA: Diagnosis not present

## 2023-05-05 DIAGNOSIS — Z96652 Presence of left artificial knee joint: Secondary | ICD-10-CM | POA: Diagnosis not present

## 2023-05-05 DIAGNOSIS — R7303 Prediabetes: Secondary | ICD-10-CM | POA: Diagnosis not present

## 2023-05-05 DIAGNOSIS — M48062 Spinal stenosis, lumbar region with neurogenic claudication: Secondary | ICD-10-CM | POA: Diagnosis not present

## 2023-05-05 DIAGNOSIS — M47812 Spondylosis without myelopathy or radiculopathy, cervical region: Secondary | ICD-10-CM | POA: Diagnosis not present

## 2023-05-05 DIAGNOSIS — H9190 Unspecified hearing loss, unspecified ear: Secondary | ICD-10-CM | POA: Diagnosis not present

## 2023-05-05 DIAGNOSIS — Z791 Long term (current) use of non-steroidal anti-inflammatories (NSAID): Secondary | ICD-10-CM | POA: Diagnosis not present

## 2023-05-05 DIAGNOSIS — I10 Essential (primary) hypertension: Secondary | ICD-10-CM | POA: Diagnosis not present

## 2023-05-05 DIAGNOSIS — M47816 Spondylosis without myelopathy or radiculopathy, lumbar region: Secondary | ICD-10-CM | POA: Diagnosis not present

## 2023-05-08 DIAGNOSIS — Z9181 History of falling: Secondary | ICD-10-CM | POA: Diagnosis not present

## 2023-05-08 DIAGNOSIS — R7303 Prediabetes: Secondary | ICD-10-CM | POA: Diagnosis not present

## 2023-05-08 DIAGNOSIS — H9190 Unspecified hearing loss, unspecified ear: Secondary | ICD-10-CM | POA: Diagnosis not present

## 2023-05-08 DIAGNOSIS — Z471 Aftercare following joint replacement surgery: Secondary | ICD-10-CM | POA: Diagnosis not present

## 2023-05-08 DIAGNOSIS — Z791 Long term (current) use of non-steroidal anti-inflammatories (NSAID): Secondary | ICD-10-CM | POA: Diagnosis not present

## 2023-05-08 DIAGNOSIS — Z96652 Presence of left artificial knee joint: Secondary | ICD-10-CM | POA: Diagnosis not present

## 2023-05-08 DIAGNOSIS — Z96651 Presence of right artificial knee joint: Secondary | ICD-10-CM | POA: Diagnosis not present

## 2023-05-08 DIAGNOSIS — E785 Hyperlipidemia, unspecified: Secondary | ICD-10-CM | POA: Diagnosis not present

## 2023-05-08 DIAGNOSIS — Z7982 Long term (current) use of aspirin: Secondary | ICD-10-CM | POA: Diagnosis not present

## 2023-05-08 DIAGNOSIS — M48062 Spinal stenosis, lumbar region with neurogenic claudication: Secondary | ICD-10-CM | POA: Diagnosis not present

## 2023-05-08 DIAGNOSIS — I10 Essential (primary) hypertension: Secondary | ICD-10-CM | POA: Diagnosis not present

## 2023-05-08 DIAGNOSIS — M47812 Spondylosis without myelopathy or radiculopathy, cervical region: Secondary | ICD-10-CM | POA: Diagnosis not present

## 2023-05-08 DIAGNOSIS — M47816 Spondylosis without myelopathy or radiculopathy, lumbar region: Secondary | ICD-10-CM | POA: Diagnosis not present

## 2023-05-09 DIAGNOSIS — M25461 Effusion, right knee: Secondary | ICD-10-CM | POA: Diagnosis not present

## 2023-05-09 DIAGNOSIS — M25561 Pain in right knee: Secondary | ICD-10-CM | POA: Diagnosis not present

## 2023-05-11 DIAGNOSIS — M25561 Pain in right knee: Secondary | ICD-10-CM | POA: Diagnosis not present

## 2023-05-11 DIAGNOSIS — M25461 Effusion, right knee: Secondary | ICD-10-CM | POA: Diagnosis not present

## 2023-05-14 DIAGNOSIS — Z471 Aftercare following joint replacement surgery: Secondary | ICD-10-CM | POA: Diagnosis not present

## 2023-05-16 DIAGNOSIS — M25461 Effusion, right knee: Secondary | ICD-10-CM | POA: Diagnosis not present

## 2023-05-16 DIAGNOSIS — M25561 Pain in right knee: Secondary | ICD-10-CM | POA: Diagnosis not present

## 2023-05-18 DIAGNOSIS — M25461 Effusion, right knee: Secondary | ICD-10-CM | POA: Diagnosis not present

## 2023-05-18 DIAGNOSIS — M25561 Pain in right knee: Secondary | ICD-10-CM | POA: Diagnosis not present

## 2023-05-23 DIAGNOSIS — R2689 Other abnormalities of gait and mobility: Secondary | ICD-10-CM | POA: Diagnosis not present

## 2023-05-23 DIAGNOSIS — R42 Dizziness and giddiness: Secondary | ICD-10-CM | POA: Diagnosis not present

## 2023-05-23 DIAGNOSIS — M25561 Pain in right knee: Secondary | ICD-10-CM | POA: Diagnosis not present

## 2023-05-23 DIAGNOSIS — M25461 Effusion, right knee: Secondary | ICD-10-CM | POA: Diagnosis not present

## 2023-05-23 DIAGNOSIS — G629 Polyneuropathy, unspecified: Secondary | ICD-10-CM | POA: Diagnosis not present

## 2023-05-23 DIAGNOSIS — R262 Difficulty in walking, not elsewhere classified: Secondary | ICD-10-CM | POA: Diagnosis not present

## 2023-05-26 DIAGNOSIS — M25561 Pain in right knee: Secondary | ICD-10-CM | POA: Diagnosis not present

## 2023-05-26 DIAGNOSIS — M25461 Effusion, right knee: Secondary | ICD-10-CM | POA: Diagnosis not present

## 2023-05-29 DIAGNOSIS — M25461 Effusion, right knee: Secondary | ICD-10-CM | POA: Diagnosis not present

## 2023-05-29 DIAGNOSIS — M25561 Pain in right knee: Secondary | ICD-10-CM | POA: Diagnosis not present

## 2023-06-01 DIAGNOSIS — Z96651 Presence of right artificial knee joint: Secondary | ICD-10-CM | POA: Diagnosis not present

## 2023-06-06 DIAGNOSIS — M25661 Stiffness of right knee, not elsewhere classified: Secondary | ICD-10-CM | POA: Diagnosis not present

## 2023-06-06 DIAGNOSIS — Z96651 Presence of right artificial knee joint: Secondary | ICD-10-CM | POA: Diagnosis not present

## 2023-06-07 DIAGNOSIS — I10 Essential (primary) hypertension: Secondary | ICD-10-CM | POA: Diagnosis not present

## 2023-06-07 DIAGNOSIS — R42 Dizziness and giddiness: Secondary | ICD-10-CM | POA: Diagnosis not present

## 2023-06-07 DIAGNOSIS — E782 Mixed hyperlipidemia: Secondary | ICD-10-CM | POA: Diagnosis not present

## 2023-06-12 DIAGNOSIS — H26492 Other secondary cataract, left eye: Secondary | ICD-10-CM | POA: Diagnosis not present

## 2023-06-12 DIAGNOSIS — H40053 Ocular hypertension, bilateral: Secondary | ICD-10-CM | POA: Diagnosis not present

## 2023-06-12 DIAGNOSIS — H43813 Vitreous degeneration, bilateral: Secondary | ICD-10-CM | POA: Diagnosis not present

## 2023-06-13 DIAGNOSIS — R42 Dizziness and giddiness: Secondary | ICD-10-CM | POA: Diagnosis not present

## 2023-07-18 DIAGNOSIS — Z96651 Presence of right artificial knee joint: Secondary | ICD-10-CM | POA: Diagnosis not present

## 2023-07-19 DIAGNOSIS — E782 Mixed hyperlipidemia: Secondary | ICD-10-CM | POA: Diagnosis not present

## 2023-07-19 DIAGNOSIS — I10 Essential (primary) hypertension: Secondary | ICD-10-CM | POA: Diagnosis not present

## 2023-07-19 DIAGNOSIS — R42 Dizziness and giddiness: Secondary | ICD-10-CM | POA: Diagnosis not present

## 2023-07-20 DIAGNOSIS — S99911A Unspecified injury of right ankle, initial encounter: Secondary | ICD-10-CM | POA: Diagnosis not present

## 2023-07-20 DIAGNOSIS — Z87898 Personal history of other specified conditions: Secondary | ICD-10-CM | POA: Diagnosis not present

## 2023-07-20 DIAGNOSIS — S8991XA Unspecified injury of right lower leg, initial encounter: Secondary | ICD-10-CM | POA: Diagnosis not present

## 2023-07-20 DIAGNOSIS — Z96651 Presence of right artificial knee joint: Secondary | ICD-10-CM | POA: Diagnosis not present

## 2023-07-20 DIAGNOSIS — S82831A Other fracture of upper and lower end of right fibula, initial encounter for closed fracture: Secondary | ICD-10-CM | POA: Diagnosis not present

## 2023-07-20 DIAGNOSIS — S82391A Other fracture of lower end of right tibia, initial encounter for closed fracture: Secondary | ICD-10-CM | POA: Diagnosis not present

## 2023-07-27 DIAGNOSIS — S82841A Displaced bimalleolar fracture of right lower leg, initial encounter for closed fracture: Secondary | ICD-10-CM | POA: Diagnosis not present

## 2023-07-27 DIAGNOSIS — M25571 Pain in right ankle and joints of right foot: Secondary | ICD-10-CM | POA: Diagnosis not present

## 2023-08-17 DIAGNOSIS — M25571 Pain in right ankle and joints of right foot: Secondary | ICD-10-CM | POA: Diagnosis not present

## 2023-08-17 DIAGNOSIS — S82841A Displaced bimalleolar fracture of right lower leg, initial encounter for closed fracture: Secondary | ICD-10-CM | POA: Diagnosis not present

## 2023-08-31 ENCOUNTER — Encounter (HOSPITAL_COMMUNITY): Payer: Self-pay

## 2023-08-31 DIAGNOSIS — Z872 Personal history of diseases of the skin and subcutaneous tissue: Secondary | ICD-10-CM | POA: Diagnosis not present

## 2023-08-31 DIAGNOSIS — Z859 Personal history of malignant neoplasm, unspecified: Secondary | ICD-10-CM | POA: Diagnosis not present

## 2023-08-31 DIAGNOSIS — L578 Other skin changes due to chronic exposure to nonionizing radiation: Secondary | ICD-10-CM | POA: Diagnosis not present

## 2023-08-31 DIAGNOSIS — Z86018 Personal history of other benign neoplasm: Secondary | ICD-10-CM | POA: Diagnosis not present

## 2023-08-31 DIAGNOSIS — B372 Candidiasis of skin and nail: Secondary | ICD-10-CM | POA: Diagnosis not present

## 2023-09-04 ENCOUNTER — Encounter (HOSPITAL_COMMUNITY): Payer: Self-pay

## 2023-09-07 ENCOUNTER — Ambulatory Visit

## 2023-09-07 VITALS — BP 132/53 | Ht 72.0 in | Wt 240.0 lb

## 2023-09-07 DIAGNOSIS — M25571 Pain in right ankle and joints of right foot: Secondary | ICD-10-CM | POA: Diagnosis not present

## 2023-09-07 DIAGNOSIS — Z2821 Immunization not carried out because of patient refusal: Secondary | ICD-10-CM

## 2023-09-07 DIAGNOSIS — Z0000 Encounter for general adult medical examination without abnormal findings: Secondary | ICD-10-CM

## 2023-09-07 DIAGNOSIS — S82841A Displaced bimalleolar fracture of right lower leg, initial encounter for closed fracture: Secondary | ICD-10-CM | POA: Diagnosis not present

## 2023-09-07 NOTE — Progress Notes (Signed)
 Because this visit was a virtual/telehealth visit,  certain criteria was not obtained, such a blood pressure, CBG if applicable, and timed get up and go. Any medications not marked as "taking" were not mentioned during the medication reconciliation part of the visit. Any vitals not documented were not able to be obtained due to this being a telehealth visit or patient was unable to self-report a recent blood pressure reading due to a lack of equipment at home via telehealth. Vitals that have been documented are verbally provided by the patient.   This visit was performed by a medical professional under my direct supervision. I was immediately available for consultation/collaboration. I have reviewed and agree with the Annual Wellness Visit documentation.  Subjective:   Evan Giles is a 83 y.o. who presents for a Medicare Wellness preventive visit.  As a reminder, Annual Wellness Visits don't include a physical exam, and some assessments may be limited, especially if this visit is performed virtually. We may recommend an in-person visit if needed.  Visit Complete: Virtual I connected with  Evan Giles on 09/07/23 by a audio enabled telemedicine application and verified that I am speaking with the correct person using two identifiers.  Patient Location: Home  Provider Location: Home Office  I discussed the limitations of evaluation and management by telemedicine. The patient expressed understanding and agreed to proceed.  Vital Signs: Because this visit was a virtual/telehealth visit, some criteria may be missing or patient reported. Any vitals not documented were not able to be obtained and vitals that have been documented are patient reported.  VideoDeclined- This patient declined Librarian, academic. Therefore the visit was completed with audio only.  Persons Participating in Visit: patient  AWV Questionnaire: Yes: Patient Medicare AWV questionnaire  was completed by the patient on 09/06/2023; I have confirmed that all information answered by patient is correct and no changes since this date.  Cardiac Risk Factors include: advanced age (>12men, >36 women);obesity (BMI >30kg/m2);hypertension;dyslipidemia     Objective:     Today's Vitals   09/06/23 1038 09/07/23 0907  BP:  (!) 132/53  Weight:  240 lb (108.9 kg)  Height:  6' (1.829 m)  PainSc: 4     Body mass index is 32.55 kg/m.     09/07/2023    9:16 AM 04/24/2023    3:49 PM 04/05/2023   11:50 AM 04/01/2021   11:32 AM 03/26/2021    2:22 PM 11/28/2019    8:00 AM 11/20/2019    8:10 AM  Advanced Directives  Does Patient Have a Medical Advance Directive? Yes Yes Yes No No Yes Yes  Type of Estate agent of [place];Living will Healthcare Power of [place];Living will    Healthcare Power of [place];Living will Healthcare Power of [place];Living will  Does patient want to make changes to medical advance directive? No - Patient declined No - Patient declined    No - Patient declined   Copy of Healthcare Power of Attorney in Chart? No - copy requested No - copy requested    No - copy requested No - copy requested  Would patient like information on creating a medical advance directive?    No - Patient declined       Current Medications (verified) Outpatient Encounter Medications as of 09/07/2023  Medication Sig   acetaminophen  (TYLENOL ) 500 MG tablet Take 1,000 mg by mouth every 6 (six) hours as needed for moderate pain (pain score 4-6).   aspirin  EC 81 MG  tablet Take 1 tablet (81 mg total) by mouth 2 (two) times daily.   atorvastatin  (LIPITOR) 20 MG tablet TAKE 1 TABLET BY MOUTH DAILY (Patient taking differently: Take 20 mg by mouth every morning.)   Calcium  Citrate-Vitamin D (CALCIUM  + D PO) Take 1 tablet by mouth daily.    fluconazole (DIFLUCAN) 200 MG tablet Take 200 mg by mouth once a week.   hydrocortisone cream 1 % Apply 1 Application topically daily as  needed for itching.   ketoconazole  (NIZORAL ) 2 % cream Apply 1 Application topically daily as needed for irritation.   losartan  (COZAAR ) 50 MG tablet TAKE 1 TABLET BY MOUTH DAILY   Magnesium  250 MG TABS Take 250 mg by mouth daily.    tamsulosin  (FLOMAX ) 0.4 MG CAPS capsule Take 0.8 mg by mouth at bedtime.    traMADol  (ULTRAM ) 50 MG tablet Take 1-2 tablets (50-100 mg total) by mouth every 4 (four) hours as needed for moderate pain (pain score 4-6).   celecoxib  (CELEBREX ) 200 MG capsule Take 1 capsule (200 mg total) by mouth 2 (two) times daily.   oxyCODONE  (OXY IR/ROXICODONE ) 5 MG immediate release tablet Take 1 tablet (5 mg total) by mouth every 4 (four) hours as needed for moderate pain (pain score 4-6) (pain score 4-6). (Patient not taking: Reported on 09/07/2023)   No facility-administered encounter medications on file as of 09/07/2023.    Allergies (verified) Hydrocodone    History: Past Medical History:  Diagnosis Date   Diverticulitis    Hard of hearing    Heart murmur    as a child   Hyperlipidemia    Hypertension    Lumbar stenosis with neurogenic claudication    Osteoarthrosis of knee    Peptic ulcer    years ago   Pre-diabetes    Wears hearing aid in both ears    Past Surgical History:  Procedure Laterality Date   ANTERIOR CERVICAL DECOMP/DISCECTOMY FUSION N/A 11/27/2019   Procedure: Cervical Four-Five Cervical Five-Six Anterior cervical decompression/discectomy/fusion with interbody prosthesis, plate, screws, explore fusion, removal of hardware;  Surgeon: Garry Kansas, MD;  Location: [facility];  Service: Neurosurgery;  Laterality: N/A;  Cervical Four-Five Cervical Five-Six Anterior cervical decompression/discectomy/fusion with interbody prosthesis, plate, screws, ex   CATARACT EXTRACTION W/ INTRAOCULAR LENS IMPLANT Left 11/2010   CATARACT EXTRACTION W/PHACO Right 10/08/2019   Procedure: CATARACT EXTRACTION PHACO AND INTRAOCULAR LENS PLACEMENT (IOC) RIGHT;  Surgeon: Clair Crews, MD;  Location: [facility];  Service: Ophthalmology;  Laterality: Right;  7.85 0:46.3   CHONDROPLASTY Right 04/10/2019   Procedure: CHONDROPLASTY;  Surgeon: Arlyne Lame, MD;  Location: [facility] ORS;  Service: Orthopedics;  Laterality: Right;   colonoscopy  2010   dental implant  08/2015   INGUINAL HERNIA REPAIR Left 05/23/2018   Procedure: LEFT INGUINAL HERNIA REPAIR WITH MESH ERAS PATHWAY;  Surgeon: Caralyn Chandler, MD;  Location: [facility] ORS;  Service: General;  Laterality: Left;   JOINT REPLACEMENT Left 06/2011   Left knee---Dr Hooten   KNEE ARTHROPLASTY Right 04/24/2023   Procedure: COMPUTER ASSISTED TOTAL KNEE ARTHROPLASTY;  Surgeon: Arlyne Lame, MD;  Location: [facility] ORS;  Service: Orthopedics;  Laterality: Right;   KNEE ARTHROSCOPY Right 09/07/2015   Procedure: ARTHROSCOPY KNEE, PARTIAL MEDIAL AND LATERAL MENISECTOMY, chondroplasty of patella femoral.;  Surgeon: Arlyne Lame, MD;  Location: [facility] ORS;  Service: Orthopedics;  Laterality: Right;   KNEE ARTHROSCOPY Right 09/25/2017   Procedure: ARTHROSCOPY KNEE;  Surgeon: Arlyne Lame, MD;  Location: [facility] ORS;  Service: Orthopedics;  Laterality: Right;   KNEE ARTHROSCOPY WITH MEDIAL MENISECTOMY Right 09/25/2017   Procedure: KNEE ARTHROSCOPY WITH MEDIAL MENISECTOMY;  Surgeon: Arlyne Lame, MD;  Location: [facility] ORS;  Service: Orthopedics;  Laterality: Right;   KNEE ARTHROSCOPY WITH MEDIAL MENISECTOMY Right 04/10/2019   Procedure: KNEE ARTHROSCOPY WITH PARTIAL MEDIAL MENISECTOMY;  Surgeon: Arlyne Lame, MD;  Location: [facility] ORS;  Service: Orthopedics;  Laterality: Right;   LUMBAR LAMINECTOMY/DECOMPRESSION MICRODISCECTOMY N/A 06/20/2018   Procedure: Lumbar 3-4, Lumbar 4-5 Laminectomy/Foraminotomy;  Surgeon: Garry Kansas, MD;  Location: [facility] OR;  Service: Neurosurgery;  Laterality: N/A;  Lumbar 3-4, Lumbar 4-5 Laminectomy/Foraminotomy   MENISCUS DEBRIDEMENT Left 05/2010   Dr Sedrick Dada   RETINAL LASER PROCEDURE Left  07/2010   Dr Linard Reno   SHOULDER ARTHROSCOPY WITH SUBACROMIAL DECOMPRESSION, ROTATOR CUFF REPAIR AND BICEP TENDON REPAIR Left 04/01/2021   Procedure: SHOULDER ARTHROSCOPY WITH DEBRIDEMENT, DECOMPRESSION, MINI OPEN ROTATOR CUFF REPAIR.;  Surgeon: Elner Hahn, MD;  Location: [facility] ORS;  Service: Orthopedics;  Laterality: Left;   spinal stenosis surgery  2018, 01/10/18, 06/20/18   Metal plate   TONSILLECTOMY AND ADENOIDECTOMY  1966   Family History  Problem Relation Age of Onset   Heart disease Father    Stroke Father        not certain about this   Cancer Sister        unknown   Diabetes Maternal Grandmother    Obesity Sister    Social History   Socioeconomic History   Marital status: Married    Spouse name: Amalia Badder   Number of children: 2   Years of education: 15   Highest education level: Associate degree: academic program  Occupational History   Occupation: Theatre stage manager    Comment: mostly Medicare advantage  Tobacco Use   Smoking status: Never    Passive exposure: Past   Smokeless tobacco: Never  Vaping Use   Vaping status: Never Used  Substance and Sexual Activity   Alcohol use: No    Alcohol/week: 0.0 standard drinks of alcohol   Drug use: No   Sexual activity: Not on file  Other Topics Concern   Not on file  Social History Narrative   I son and 1 daughter   Has living will   Wife is health care POA --- alternate would be son   Would accept resuscitation---but no prolonged ventilation   Probably would accept feeding tube--at least for a limited time   Lives with wife   Social Drivers of Corporate investment banker Strain: Low Risk  (09/06/2023)   Overall Financial Resource Strain (CARDIA)    Difficulty of Paying Living Expenses: Not hard at all  Food Insecurity: No Food Insecurity (09/06/2023)   Hunger Vital Sign    Worried About Running Out of Food in the Last Year: Never true    Ran Out of Food in the Last Year: Never true  Transportation Needs:  No Transportation Needs (09/06/2023)   PRAPARE - Administrator, Civil Service (Medical): No    Lack of Transportation (Non-Medical): No  Physical Activity: Sufficiently Active (09/06/2023)   Exercise Vital Sign    Days of Exercise per Week: 5 days    Minutes of Exercise per Session: 40 min  Stress: No Stress Concern Present (09/06/2023)   [employer] of Occupational Health - Occupational Stress Questionnaire    Feeling of Stress : Not at all  Social Connections: Socially Integrated (09/06/2023)   Social Connection and Isolation  Panel [NHANES]    Frequency of Communication with Friends and Family: More than three times a week    Frequency of Social Gatherings with Friends and Family: Twice a week    Attends Religious Services: More than 4 times per year    Active Member of [employer] or Organizations: Yes    Attends Banker Meetings: Never    Marital Status: Married    Tobacco Counseling Counseling given: Not Answered    Clinical Intake:  Pre-visit preparation completed: Yes  Pain : 0-10 Pain Score: 4  Pain Type: Chronic pain Pain Location: Leg Pain Orientation: Right Pain Descriptors / Indicators: Cramping, Aching Pain Onset: 1 to 4 weeks ago Pain Frequency: Occasional     BMI - recorded: 32.55 Nutritional Status: BMI > 30  Obese Nutritional Risks: None Diabetes: No  Lab Results  Component Value Date   HGBA1C 5.9 (H) 04/05/2023   HGBA1C 6.2 08/31/2021   HGBA1C 6.3 08/25/2020     How often do you need to have someone help you when you read instructions, pamphlets, or other written materials from your doctor or pharmacy?: 1 - Never What is the last grade level you completed in school?: Some college  Interpreter Needed?: No  Information entered by :: Juliann Ochoa   Activities of Daily Living     09/06/2023   10:38 AM 04/24/2023    3:49 PM  In your present state of health, do you have any difficulty performing the following  activities:  Hearing? 1 0  Comment wears hearing aids   Vision? 0 0  Difficulty concentrating or making decisions? 0 0  Walking or climbing stairs? 0   Dressing or bathing? 0   Doing errands, shopping? 0 0  Preparing Food and eating ? N   Using the Toilet? Y   In the past six months, have you accidently leaked urine? N   Do you have problems with loss of bowel control? N   Managing your Medications? N   Managing your Finances? N   Housekeeping or managing your Housekeeping? N     Patient Care Team: Helaine Llanos, MD as PCP - General (Pediatrics)  Indicate any recent Medical Services you may have received from other than [facility] providers in the past year (date may be approximate).     Assessment:    This is a routine wellness examination for Evan Giles.  Hearing/Vision screen Hearing Screening - Comments:: Patient wears hearing aids  Vision Screening - Comments:: Patient wears glasses   Goals Addressed               This Visit's Progress     Patient Stated (pt-stated)        Patient would like to stay healthy.        Depression Screen     09/07/2023    9:18 AM 09/06/2022    9:27 AM 09/06/2022    9:06 AM 07/28/2022    3:27 PM 08/31/2021    8:42 AM 08/25/2020    8:20 AM 08/23/2019    8:01 AM  PHQ 2/9 Scores  PHQ - 2 Score 0 0 0 0 0 0 0  PHQ- 9 Score 1          Fall Risk     09/06/2023   10:38 AM 09/06/2022    9:27 AM 09/06/2022    9:05 AM 08/31/2021    8:42 AM 08/25/2020    8:21 AM  Fall Risk   Falls in the past  year? 1 0 0 0 0  Number falls in past yr: 0  0    Injury with Fall? 1  0    Risk for fall due to : History of fall(s)      Follow up Falls evaluation completed;Falls prevention discussed        MEDICARE RISK AT HOME:  Medicare Risk at Home Any stairs in or around the home?: (Patient-Rptd) Yes If so, are there any without handrails?: (Patient-Rptd) No Home free of loose throw rugs in walkways, pet beds, electrical cords, etc?: (Patient-Rptd)  Yes Adequate lighting in your home to reduce risk of falls?: (Patient-Rptd) Yes Life alert?: (Patient-Rptd) No Use of a cane, walker or w/c?: Yes (cane and walker) Grab bars in the bathroom?: (Patient-Rptd) Yes Shower chair or bench in shower?: (Patient-Rptd) Yes Elevated toilet seat or a handicapped toilet?: (Patient-Rptd) Yes  TIMED UP AND GO:  Was the test performed?  No  Cognitive Function: 6CIT completed        09/07/2023    9:12 AM  6CIT Screen  What Year? 0 points  What month? 0 points  What time? 0 points  Count back from 20 0 points  Months in reverse 0 points  Repeat phrase 0 points  Total Score 0 points    Immunizations Immunization History  Administered Date(s) Administered   Influenza Split 01/26/2011   Influenza, High Dose Seasonal PF 01/30/2015, 01/25/2016, 03/26/2018, 01/04/2019   Influenza-Unspecified 01/23/2013, 02/09/2014, 01/23/2017, 01/13/2020, 01/08/2021, 01/30/2023   PFIZER(Purple Top)SARS-COV-2 Vaccination 05/10/2019, 06/18/2019   Pneumococcal Conjugate-13 04/07/2014   Pneumococcal Polysaccharide-23 04/25/2010, 04/21/2017   Respiratory Syncytial Virus Vaccine,Recomb Aduvanted(Arexvy) 01/30/2023   Td 08/25/2014   Tdap 04/25/2010, 11/17/2014   Zoster Recombinant(Shingrix) 10/28/2021, 01/18/2022   Zoster, Live 04/25/2009    Screening Tests Health Maintenance  Topic Date Due   COVID-19 Vaccine (3 - Pfizer risk series) 07/16/2019   INFLUENZA VACCINE  11/24/2023   Medicare Annual Wellness (AWV)  09/06/2024   DTaP/Tdap/Td (4 - Td or Tdap) 11/16/2024   Pneumonia Vaccine 35+ Years old  Completed   Zoster Vaccines- Shingrix  Completed   HPV VACCINES  Aged Out   Meningococcal B Vaccine  Aged Out    Health Maintenance  Health Maintenance Due  Topic Date Due   COVID-19 Vaccine (3 - Pfizer risk series) 07/16/2019   Health Maintenance Items Addressed:declined vaccination  Additional Screening:  Vision Screening: Recommended annual  ophthalmology exams for early detection of glaucoma and other disorders of the eye.  Dental Screening: Recommended annual dental exams for proper oral hygiene  Community Resource Referral / Chronic Care Management: CRR required this visit?  no  CCM required this visit?  No   Plan:    I have personally reviewed and noted the following in the patient's chart:   Medical and social history Use of alcohol, tobacco or illicit drugs  Current medications and supplements including opioid prescriptions. Patient is not currently taking opioid prescriptions. Functional ability and status Nutritional status Physical activity Advanced directives List of other physicians Hospitalizations, surgeries, and ER visits in previous 12 months Vitals Screenings to include cognitive, depression, and falls Referrals and appointments  In addition, I have reviewed and discussed with patient certain preventive protocols, quality metrics, and best practice recommendations. A written personalized care plan for preventive services as well as general preventive health recommendations were provided to patient.   Freeda Jerry, [place]   09/07/2023   After Visit Summary: (MyChart) Due to this being a telephonic visit,  the after visit summary with patients personalized plan was offered to patient via MyChart   Notes: Nothing significant to report at this time.

## 2023-09-07 NOTE — Patient Instructions (Addendum)
 Evan Giles , Thank you for taking time out of your busy schedule to complete your Annual Wellness Visit with me. I enjoyed our conversation and look forward to speaking with you again next year. I, as well as your care team,  appreciate your ongoing commitment to your health goals. Please review the following plan we discussed and let me know if I can assist you in the future. Your Game plan/ To Do List    Referrals: If you haven't heard from the office you've been referred to, please reach out to them at the phone provided.   Follow up Visits: Next Medicare AWV with our clinical staff: 09/10/2024   Have you seen your provider in the last 6 months (3 months if uncontrolled diabetes)? Yes Next Office Visit with your provider: 09/11/2023  Clinician Recommendations:  Aim for 30 minutes of exercise or brisk walking, 6-8 glasses of water, and 5 servings of fruits and vegetables each day.       This is a list of the screening recommended for you and due dates:  Health Maintenance  Topic Date Due   COVID-19 Vaccine (3 - Pfizer risk series) 07/16/2019   Flu Shot  11/24/2023   Medicare Annual Wellness Visit  09/06/2024   DTaP/Tdap/Td vaccine (4 - Td or Tdap) 11/16/2024   Pneumonia Vaccine  Completed   Zoster (Shingles) Vaccine  Completed   HPV Vaccine  Aged Out   Meningitis B Vaccine  Aged Out    Advanced directives: (Declined) Advance directive discussed with you today. Even though you declined this today, please call our office should you change your mind, and we can give you the proper paperwork for you to fill out. Advance Care Planning is important because it:  [x]  Makes sure you receive the medical care that is consistent with your values, goals, and preferences  [x]  It provides guidance to your family and loved ones and reduces their decisional burden about whether or not they are making the right decisions based on your wishes.  Follow the link provided in your after visit summary  or read over the paperwork we have mailed to you to help you started getting your Advance Directives in place. If you need assistance in completing these, please reach out to us  so that we can help you!  See attachments for Preventive Care and Fall Prevention Tips.

## 2023-09-08 ENCOUNTER — Encounter: Payer: Medicare Other | Admitting: Internal Medicine

## 2023-09-11 ENCOUNTER — Encounter: Payer: Self-pay | Admitting: Internal Medicine

## 2023-09-11 ENCOUNTER — Ambulatory Visit: Admitting: Internal Medicine

## 2023-09-11 VITALS — BP 122/80 | HR 62 | Temp 97.7°F | Ht 71.0 in | Wt 242.0 lb

## 2023-09-11 DIAGNOSIS — E785 Hyperlipidemia, unspecified: Secondary | ICD-10-CM | POA: Diagnosis not present

## 2023-09-11 DIAGNOSIS — N401 Enlarged prostate with lower urinary tract symptoms: Secondary | ICD-10-CM | POA: Diagnosis not present

## 2023-09-11 DIAGNOSIS — Z0000 Encounter for general adult medical examination without abnormal findings: Secondary | ICD-10-CM

## 2023-09-11 DIAGNOSIS — I10 Essential (primary) hypertension: Secondary | ICD-10-CM

## 2023-09-11 DIAGNOSIS — N138 Other obstructive and reflux uropathy: Secondary | ICD-10-CM

## 2023-09-11 NOTE — Assessment & Plan Note (Signed)
 Recovering from surgery--then ankle fracture Getting back to exercise--therapy No cancer screening due to age Flu vaccine in the fall--prefers no COVID

## 2023-09-11 NOTE — Progress Notes (Signed)
 Subjective:    Patient ID: Evan Giles, male    DOB: 1941-01-08, 83 y.o.   MRN: [id number]  HPI Here for physical  Had right total knee done in December Then fell or twisted it going down steps---and broke the right ankle Now back to just walking with a cane---done with the boot  Neck is okay Back pain is overall improved  Just went into gym for the first time in months Will be starting PT now  Current Outpatient Medications on File Prior to Visit  Medication Sig Dispense Refill   acetaminophen  (TYLENOL ) 500 MG tablet Take 1,000 mg by mouth every 6 (six) hours as needed for moderate pain (pain score 4-6).     aspirin  EC 81 MG tablet Take 1 tablet (81 mg total) by mouth 2 (two) times daily.     atorvastatin  (LIPITOR) 20 MG tablet TAKE 1 TABLET BY MOUTH DAILY (Patient taking differently: Take 20 mg by mouth every morning.) 90 tablet 3   Calcium  Citrate-Vitamin D (CALCIUM  + D PO) Take 1 tablet by mouth daily.      fluconazole (DIFLUCAN) 200 MG tablet Take 200 mg by mouth once a week.     hydrocortisone cream 1 % Apply 1 Application topically daily as needed for itching.     ketoconazole  (NIZORAL ) 2 % cream Apply 1 Application topically daily as needed for irritation.     losartan  (COZAAR ) 50 MG tablet TAKE 1 TABLET BY MOUTH DAILY 90 tablet 3   Magnesium  250 MG TABS Take 250 mg by mouth daily.      tamsulosin  (FLOMAX ) 0.4 MG CAPS capsule Take 0.8 mg by mouth at bedtime.      No current facility-administered medications on file prior to visit.    Allergies  Allergen Reactions   Hydrocodone  Itching    Past Medical History:  Diagnosis Date   Diverticulitis    Hard of hearing    Heart murmur    as a child   Hyperlipidemia    Hypertension    Lumbar stenosis with neurogenic claudication    Osteoarthrosis of knee    Peptic ulcer    years ago   Pre-diabetes    Wears hearing aid in both ears     Past Surgical History:  Procedure Laterality Date   ANTERIOR CERVICAL  DECOMP/DISCECTOMY FUSION N/A 11/27/2019   Procedure: Cervical Four-Five Cervical Five-Six Anterior cervical decompression/discectomy/fusion with interbody prosthesis, plate, screws, explore fusion, removal of hardware;  Surgeon: Garry Kansas, MD;  Location: [facility] OR;  Service: Neurosurgery;  Laterality: N/A;  Cervical Four-Five Cervical Five-Six Anterior cervical decompression/discectomy/fusion with interbody prosthesis, plate, screws, ex   CATARACT EXTRACTION W/ INTRAOCULAR LENS IMPLANT Left 11/2010   CATARACT EXTRACTION W/PHACO Right 10/08/2019   Procedure: CATARACT EXTRACTION PHACO AND INTRAOCULAR LENS PLACEMENT (IOC) RIGHT;  Surgeon: Clair Crews, MD;  Location: [facility];  Service: Ophthalmology;  Laterality: Right;  7.85 0:46.3   CHONDROPLASTY Right 04/10/2019   Procedure: CHONDROPLASTY;  Surgeon: Arlyne Lame, MD;  Location: [facility] ORS;  Service: Orthopedics;  Laterality: Right;   colonoscopy  2010   dental implant  08/2015   INGUINAL HERNIA REPAIR Left 05/23/2018   Procedure: LEFT INGUINAL HERNIA REPAIR WITH MESH ERAS PATHWAY;  Surgeon: Caralyn Chandler, MD;  Location: [facility] ORS;  Service: General;  Laterality: Left;   JOINT REPLACEMENT Left 06/2011   Left knee---Dr Hooten   KNEE ARTHROPLASTY Right 04/24/2023   Procedure: COMPUTER ASSISTED TOTAL KNEE ARTHROPLASTY;  Surgeon: Arlyne Lame,  MD;  Location: [facility] ORS;  Service: Orthopedics;  Laterality: Right;   KNEE ARTHROSCOPY Right 09/07/2015   Procedure: ARTHROSCOPY KNEE, PARTIAL MEDIAL AND LATERAL MENISECTOMY, chondroplasty of patella femoral.;  Surgeon: Arlyne Lame, MD;  Location: [facility];  Service: Orthopedics;  Laterality: Right;   KNEE ARTHROSCOPY Right 09/25/2017   Procedure: ARTHROSCOPY KNEE;  Surgeon: Arlyne Lame, MD;  Location: [facility];  Service: Orthopedics;  Laterality: Right;   KNEE ARTHROSCOPY WITH MEDIAL MENISECTOMY Right 09/25/2017   Procedure: KNEE ARTHROSCOPY WITH MEDIAL MENISECTOMY;  Surgeon: Arlyne Lame, MD;  Location: [facility];  Service: Orthopedics;  Laterality: Right;   KNEE ARTHROSCOPY WITH MEDIAL MENISECTOMY Right 04/10/2019   Procedure: KNEE ARTHROSCOPY WITH PARTIAL MEDIAL MENISECTOMY;  Surgeon: Arlyne Lame, MD;  Location: [facility];  Service: Orthopedics;  Laterality: Right;   LUMBAR LAMINECTOMY/DECOMPRESSION MICRODISCECTOMY N/A 06/20/2018   Procedure: Lumbar 3-4, Lumbar 4-5 Laminectomy/Foraminotomy;  Surgeon: Garry Kansas, MD;  Location: [facility] OR;  Service: Neurosurgery;  Laterality: N/A;  Lumbar 3-4, Lumbar 4-5 Laminectomy/Foraminotomy   MENISCUS DEBRIDEMENT Left 05/2010   Dr Sedrick Dada   RETINAL LASER PROCEDURE Left 07/2010   Dr Linard Reno   SHOULDER ARTHROSCOPY WITH SUBACROMIAL DECOMPRESSION, ROTATOR CUFF REPAIR AND BICEP TENDON REPAIR Left 04/01/2021   Procedure: SHOULDER ARTHROSCOPY WITH DEBRIDEMENT, DECOMPRESSION, MINI OPEN ROTATOR CUFF REPAIR.;  Surgeon: Elner Hahn, MD;  Location: [facility];  Service: Orthopedics;  Laterality: Left;   spinal stenosis surgery  2018, 01/10/18, 06/20/18   Metal plate   TONSILLECTOMY AND ADENOIDECTOMY  1966    Family History  Problem Relation Age of Onset   Heart disease Father    Stroke Father        not certain about this   Cancer Sister        unknown   Diabetes Maternal Grandmother    Obesity Sister     Social History   Socioeconomic History   Marital status: Married    Spouse name: Amalia Badder   Number of children: 2   Years of education: 15   Highest education level: Associate degree: academic program  Occupational History   Occupation: Theatre stage manager    Comment: mostly Medicare advantage  Tobacco Use   Smoking status: Never    Passive exposure: Past   Smokeless tobacco: Never  Vaping Use   Vaping status: Never Used  Substance and Sexual Activity   Alcohol use: No    Alcohol/week: 0.0 standard drinks of alcohol   Drug use: No   Sexual activity: Not on file  Other Topics Concern   Not on file  Social  History Narrative   I son and 1 daughter   Has living will   Wife is health care POA --- alternate would be son   Would accept resuscitation---but no prolonged ventilation   Probably would accept feeding tube--at least for a limited time   Lives with wife   Social Drivers of Corporate investment banker Strain: Low Risk  (09/06/2023)   Overall Financial Resource Strain (CARDIA)    Difficulty of Paying Living Expenses: Not hard at all  Food Insecurity: No Food Insecurity (09/06/2023)   Hunger Vital Sign    Worried About Running Out of Food in the Last Year: Never true    Ran Out of Food in the Last Year: Never true  Transportation Needs: No Transportation Needs (09/06/2023)   PRAPARE - Administrator, Civil Service (Medical): No    Lack of Transportation (Non-Medical): No  Physical Activity: Sufficiently Active (09/06/2023)   Exercise Vital Sign    Days of Exercise per Week: 5 days    Minutes of Exercise per Session: 40 min  Stress: No Stress Concern Present (09/06/2023)   [employer] of Occupational Health - Occupational Stress Questionnaire    Feeling of Stress : Not at all  Social Connections: Socially Integrated (09/06/2023)   Social Connection and Isolation Panel [NHANES]    Frequency of Communication with Friends and Family: More than three times a week    Frequency of Social Gatherings with Friends and Family: Twice a week    Attends Religious Services: More than 4 times per year    Active Member of [employer] or Organizations: Yes    Attends Banker Meetings: Never    Marital Status: Married  Catering manager Violence: Not At Risk (09/07/2023)   Humiliation, Afraid, Rape, and Kick questionnaire    Fear of Current or Ex-Partner: No    Emotionally Abused: No    Physically Abused: No    Sexually Abused: No   Review of Systems  Constitutional:  Negative for fatigue and unexpected weight change.       Wears seat belt  HENT:  Positive for hearing loss.         Hearing aides help Keeps up with dentist  Eyes:  Negative for visual disturbance.       No diplopia or unilateral vision loss  Respiratory:  Negative for cough, chest tightness and shortness of breath.   Cardiovascular:  Negative for chest pain and palpitations.  Gastrointestinal:  Positive for constipation. Negative for blood in stool.       Uses stool softener since surgery  Endocrine: Negative for polydipsia and polyuria.  Genitourinary:  Negative for urgency.       Some dribbling--is on flomax  Nocturia x 2 usually  Musculoskeletal:  Positive for arthralgias. Negative for joint swelling.  Skin:        Rash better with cream from derm No suspicious skin lesions  Allergic/Immunologic: Negative for environmental allergies and immunocompromised state.  Neurological:  Positive for dizziness. Negative for syncope, light-headedness and headaches.  Hematological:  Negative for adenopathy. Does not bruise/bleed easily.  Psychiatric/Behavioral:  Negative for dysphoric mood and sleep disturbance. The patient is not nervous/anxious.        Objective:   Physical Exam Constitutional:      Appearance: Normal appearance.  HENT:     Mouth/Throat:     Pharynx: No oropharyngeal exudate or posterior oropharyngeal erythema.  Eyes:     Conjunctiva/sclera: Conjunctivae normal.     Pupils: Pupils are equal, round, and reactive to light.  Cardiovascular:     Rate and Rhythm: Normal rate and regular rhythm.     Pulses: Normal pulses.     Heart sounds: No murmur heard.    No gallop.  Pulmonary:     Effort: Pulmonary effort is normal.     Breath sounds: Normal breath sounds. No wheezing or rales.  Abdominal:     Palpations: Abdomen is soft.     Tenderness: There is no abdominal tenderness.  Musculoskeletal:     Cervical back: Neck supple.     Left lower leg: No edema.     Comments: 1+ right ankle edema  Lymphadenopathy:     Cervical: No cervical adenopathy.  Skin:    Findings: No  lesion or rash.  Neurological:     General: No focal deficit present.     Mental Status:  He is alert and oriented to person, place, and time.  Psychiatric:        Mood and Affect: Mood normal.        Behavior: Behavior normal.            Assessment & Plan:

## 2023-09-11 NOTE — Assessment & Plan Note (Signed)
No problems with primary prevention with atorvastatin 20 ?

## 2023-09-11 NOTE — Assessment & Plan Note (Signed)
 BP Readings from Last 3 Encounters:  09/11/23 122/80  09/07/23 (!) 132/53  04/25/23 (!) 132/53   Controlled with losartan  50 daily

## 2023-09-11 NOTE — Assessment & Plan Note (Signed)
 Mild problems despite tamsulosin 

## 2023-09-12 ENCOUNTER — Encounter (INDEPENDENT_AMBULATORY_CARE_PROVIDER_SITE_OTHER): Payer: Self-pay

## 2023-09-26 DIAGNOSIS — M6281 Muscle weakness (generalized): Secondary | ICD-10-CM | POA: Diagnosis not present

## 2023-09-26 DIAGNOSIS — S82841A Displaced bimalleolar fracture of right lower leg, initial encounter for closed fracture: Secondary | ICD-10-CM | POA: Diagnosis not present

## 2023-09-26 DIAGNOSIS — M25571 Pain in right ankle and joints of right foot: Secondary | ICD-10-CM | POA: Diagnosis not present

## 2023-09-26 DIAGNOSIS — M25671 Stiffness of right ankle, not elsewhere classified: Secondary | ICD-10-CM | POA: Diagnosis not present

## 2023-10-02 DIAGNOSIS — M9902 Segmental and somatic dysfunction of thoracic region: Secondary | ICD-10-CM | POA: Diagnosis not present

## 2023-10-02 DIAGNOSIS — M5136 Other intervertebral disc degeneration, lumbar region with discogenic back pain only: Secondary | ICD-10-CM | POA: Diagnosis not present

## 2023-10-02 DIAGNOSIS — M9901 Segmental and somatic dysfunction of cervical region: Secondary | ICD-10-CM | POA: Diagnosis not present

## 2023-10-02 DIAGNOSIS — M9903 Segmental and somatic dysfunction of lumbar region: Secondary | ICD-10-CM | POA: Diagnosis not present

## 2023-10-11 DIAGNOSIS — M9903 Segmental and somatic dysfunction of lumbar region: Secondary | ICD-10-CM | POA: Diagnosis not present

## 2023-10-11 DIAGNOSIS — M9901 Segmental and somatic dysfunction of cervical region: Secondary | ICD-10-CM | POA: Diagnosis not present

## 2023-10-11 DIAGNOSIS — M5136 Other intervertebral disc degeneration, lumbar region with discogenic back pain only: Secondary | ICD-10-CM | POA: Diagnosis not present

## 2023-10-11 DIAGNOSIS — M9902 Segmental and somatic dysfunction of thoracic region: Secondary | ICD-10-CM | POA: Diagnosis not present

## 2023-10-17 DIAGNOSIS — S82841D Displaced bimalleolar fracture of right lower leg, subsequent encounter for closed fracture with routine healing: Secondary | ICD-10-CM | POA: Diagnosis not present

## 2023-10-18 DIAGNOSIS — M9903 Segmental and somatic dysfunction of lumbar region: Secondary | ICD-10-CM | POA: Diagnosis not present

## 2023-10-18 DIAGNOSIS — M9901 Segmental and somatic dysfunction of cervical region: Secondary | ICD-10-CM | POA: Diagnosis not present

## 2023-10-18 DIAGNOSIS — M9902 Segmental and somatic dysfunction of thoracic region: Secondary | ICD-10-CM | POA: Diagnosis not present

## 2023-10-18 DIAGNOSIS — M5136 Other intervertebral disc degeneration, lumbar region with discogenic back pain only: Secondary | ICD-10-CM | POA: Diagnosis not present

## 2023-10-24 DIAGNOSIS — R300 Dysuria: Secondary | ICD-10-CM | POA: Diagnosis not present

## 2023-10-24 DIAGNOSIS — R3914 Feeling of incomplete bladder emptying: Secondary | ICD-10-CM | POA: Diagnosis not present

## 2023-10-25 DIAGNOSIS — M9902 Segmental and somatic dysfunction of thoracic region: Secondary | ICD-10-CM | POA: Diagnosis not present

## 2023-10-25 DIAGNOSIS — M9901 Segmental and somatic dysfunction of cervical region: Secondary | ICD-10-CM | POA: Diagnosis not present

## 2023-10-25 DIAGNOSIS — M9903 Segmental and somatic dysfunction of lumbar region: Secondary | ICD-10-CM | POA: Diagnosis not present

## 2023-10-25 DIAGNOSIS — M5136 Other intervertebral disc degeneration, lumbar region with discogenic back pain only: Secondary | ICD-10-CM | POA: Diagnosis not present

## 2023-10-25 DIAGNOSIS — L853 Xerosis cutis: Secondary | ICD-10-CM | POA: Diagnosis not present

## 2023-11-08 DIAGNOSIS — M9901 Segmental and somatic dysfunction of cervical region: Secondary | ICD-10-CM | POA: Diagnosis not present

## 2023-11-08 DIAGNOSIS — R3914 Feeling of incomplete bladder emptying: Secondary | ICD-10-CM | POA: Diagnosis not present

## 2023-11-08 DIAGNOSIS — M5136 Other intervertebral disc degeneration, lumbar region with discogenic back pain only: Secondary | ICD-10-CM | POA: Diagnosis not present

## 2023-11-08 DIAGNOSIS — M9902 Segmental and somatic dysfunction of thoracic region: Secondary | ICD-10-CM | POA: Diagnosis not present

## 2023-11-08 DIAGNOSIS — M9903 Segmental and somatic dysfunction of lumbar region: Secondary | ICD-10-CM | POA: Diagnosis not present

## 2023-11-22 DIAGNOSIS — M5136 Other intervertebral disc degeneration, lumbar region with discogenic back pain only: Secondary | ICD-10-CM | POA: Diagnosis not present

## 2023-11-22 DIAGNOSIS — M9901 Segmental and somatic dysfunction of cervical region: Secondary | ICD-10-CM | POA: Diagnosis not present

## 2023-11-22 DIAGNOSIS — M9902 Segmental and somatic dysfunction of thoracic region: Secondary | ICD-10-CM | POA: Diagnosis not present

## 2023-11-22 DIAGNOSIS — M9903 Segmental and somatic dysfunction of lumbar region: Secondary | ICD-10-CM | POA: Diagnosis not present

## 2023-12-12 DIAGNOSIS — M9901 Segmental and somatic dysfunction of cervical region: Secondary | ICD-10-CM | POA: Diagnosis not present

## 2023-12-12 DIAGNOSIS — M9902 Segmental and somatic dysfunction of thoracic region: Secondary | ICD-10-CM | POA: Diagnosis not present

## 2023-12-12 DIAGNOSIS — M5136 Other intervertebral disc degeneration, lumbar region with discogenic back pain only: Secondary | ICD-10-CM | POA: Diagnosis not present

## 2023-12-12 DIAGNOSIS — M9903 Segmental and somatic dysfunction of lumbar region: Secondary | ICD-10-CM | POA: Diagnosis not present

## 2023-12-13 ENCOUNTER — Other Ambulatory Visit: Payer: Self-pay | Admitting: Internal Medicine

## 2023-12-14 DIAGNOSIS — I10 Essential (primary) hypertension: Secondary | ICD-10-CM | POA: Diagnosis not present

## 2023-12-14 DIAGNOSIS — R42 Dizziness and giddiness: Secondary | ICD-10-CM | POA: Diagnosis not present

## 2023-12-14 DIAGNOSIS — E782 Mixed hyperlipidemia: Secondary | ICD-10-CM | POA: Diagnosis not present

## 2023-12-14 NOTE — Progress Notes (Signed)
 New Patient Visit   Chief Complaint: Chief Complaint  Patient presents with  . Follow-up  . Dizziness   Date of Service: 12/14/2023 Date of Birth: July 13, 1940 PCP: Evan Charlie FERNS, MD [address] [place] [address]  History of Present Illness:   [name] is a 83 y.o.male patient with past medical history of hypertension and hyperlipidemia who is here for a follow up visit.  History of Present Illness  Evan Giles is an 83 year old male with hypertension who presents with dizziness.  He experiences dizziness, particularly when standing, accompanied by a significant increase in heart rate. He consumes approximately 36 to 48 ounces of water daily.  He is on losartan  for hypertension and has recently reduced the dosage due to low blood pressure, which has helped to some extent. No chest pain or shortness of breath. He continues to take baby aspirin  since his ankle fracture.  He has a history of a broken left ankle, his first fracture at the age of 60, and experiences a sensation of a pinched nerve in the leg, which occasionally gives way. The leg is more swollen compared to the other one.  Past Medical and Surgical History  Past Medical History Past Medical History:  Diagnosis Date  . Chickenpox   . Hyperlipidemia   . Hypertension     Past Surgical History He has a past surgical history that includes Tonsillectomy (1966); Left total knee arthroplasty (07/06/2011); Left knee arthroscopy (06/02/2010); Right knee arthroscopy, partial medial and lateral meniscectomies, and chondroplasty (09/07/2015); Right knee arthroscopy, partial medial and lateral meniscectomies, and chondroplasty (09/25/2017); L3-4, L4-5 laminectomies, foraminotomies (06/20/2018); Left inguinal hernia repair with mesh (05/23/2018); Cervical surgery (12/2017); Right knee arthroscopy, partial medial meniscectomy, and chondroplasty (04/10/2019);  Extensive arthroscopic debridement, arthroscopic  subacromial decompression, and mini-open rotator cuff repair, left shoulder (Left, 04/01/2021); and Right total knee arthroplasty using computer-assisted navigation (04/24/2023).   Medications and Allergies  Current Medications  Current Outpatient Medications on File Prior to Visit  Medication Sig Dispense Refill  . acetaminophen  (TYLENOL ) 500 MG tablet Take 1,000 mg by mouth at bedtime    . aspirin  81 MG EC tablet Take 81 mg by mouth once daily    . atorvastatin  (LIPITOR) 20 MG tablet TAKE ONE TABLET BY MOUTH EVERY DAY    . calcium  citrate-vitamin D3 (CITRACAL+D) 315-200 mg-unit tablet Take 1 tablet by mouth once daily      . hydrocortisone 2.5 % ointment     . ketoconazole  (NIZORAL ) 2 % cream     . magnesium  chloride, bulk, Crys Take 1 tablet by mouth once daily      . tamsulosin  (FLOMAX ) 0.4 mg capsule Take 0.8 mg by mouth nightly    . celecoxib  (CELEBREX ) 200 MG capsule Take 200 mg by mouth 2 (two) times daily (Patient not taking: Reported on 12/14/2023)    . fluconazole (DIFLUCAN) 200 MG tablet  (Patient not taking: Reported on 12/14/2023)     No current facility-administered medications on file prior to visit.    Allergies: Hydrocodone   Social and Family History  Social History  reports that he has never smoked. He has never used smokeless tobacco. He reports that he does not drink alcohol and does not use drugs.  Family History Family History  Problem Relation Name Age of Onset  . Heart failure Father      Review of Systems   Review of Systems  Neurological:  Positive for dizziness.      Physical  Examination   Vitals:BP 110/70 (BP Location: Left upper arm, Patient Position: Standing)   Pulse 100   Ht 182.9 cm (6')   Wt (!) 112.9 kg (249 lb)   SpO2 97%   BMI 33.77 kg/m  Ht:182.9 cm (6') Wt:(!) 112.9 kg (249 lb) ADJ:Anib surface area is 2.39 meters squared. Body mass index is 33.77 kg/m.  Physical Exam Vitals reviewed.  HENT:     Head: Normocephalic and  atraumatic.  Cardiovascular:     Rate and Rhythm: Normal rate and regular rhythm.     Pulses: Normal pulses.     Heart sounds: Normal heart sounds. No murmur heard. Pulmonary:     Effort: Pulmonary effort is normal.     Breath sounds: Normal breath sounds.  Abdominal:     General: Bowel sounds are normal.  Musculoskeletal:     Right lower leg: No edema.     Left lower leg: No edema.  Skin:    General: Skin is warm and dry.  Neurological:     Mental Status: He is alert.       Data & Results   No results for input(s): CHOLTOTAL, HDL, LDLCALC, LDLDIRECT, LDL, VLDL, TRIG in the last 73719 hours.  No results for input(s): NA, K, BUN, CREATININE, CO2, GLUCOSE, GLUF, ALT, AST, TBILI, ALB in the last 73719 hours.  No results for input(s): WBC, HGB, HCT, MCV, PLT in the last 73719 hours.  Recent Labs    01/13/23 1151  HGBA1C 6.3*        Assessment   83 y.o. male with  Encounter Diagnoses  Name Primary?  . HTN, goal below 130/80 Yes  . Mixed hyperlipidemia   . Dizziness      06/13/2023 ECHO: NORMAL LEFT VENTRICULAR SYSTOLIC FUNCTION WITH MILD LVH ESTIMATED EF: >55% NORMAL LA PRESSURES WITH DIASTOLIC DYSFUNCTION (GRADE 1) NORMAL RIGHT VENTRICULAR SYSTOLIC FUNCTION VALVULAR REGURGITATION: No AR, MILD MR, No PR, MILD TR NO VALVULAR STENOSIS    Plan   No orders of the defined types were placed in this encounter.  Continue current cardiac medications. Encourage low-sodium diet, less than 2000 mg daily.  Return in about 6 months (around 06/15/2024). Assessment & Plan  Dizziness and orthostatic symptoms Dizziness persists, likely exacerbated by orthostatic hypotension. Heart rate increases significantly upon standing. Inadequate hydration may contribute to symptoms, especially in hot weather. - Encourage increased fluid intake to at least 80 ounces daily. - Seek immediate care if dizziness worsens or heart rate increases  significantly.  Essential hypertension Blood pressure is currently on the lower end. Reduction in losartan  dosage has been beneficial. Further reduction in dosage is considered safe and appropriate. - Reduce losartan  dosage to 12.5 mg by cutting current tablets in half. - Send updated prescription for losartan .  Left lower leg fracture, status post healing Left lower leg fracture has healed. Occasional pinched nerve sensation noted, possibly related to previous injury.  Left lower extremity swelling Persistent swelling in the left lower extremity, likely related to previous knee and ankle surgeries. Swelling is expected and not unusual post-surgery.   Attestation Statement:   I personally performed the service, non-incident to. (WP)   SABINA CUSTOVIC, DO    Sabina Custovic, DO

## 2023-12-15 ENCOUNTER — Encounter: Payer: Self-pay | Admitting: Pharmacist

## 2023-12-15 NOTE — Progress Notes (Signed)
 Pharmacy Quality Measure Review  This patient is appearing on a report for being at risk of failing the adherence measure for hypertension (ACEi/ARB) medications this calendar year.   Medication: losartan  25 mg Last fill date: 10/13/23 for 30 day supply (30 tablets for 30 day supply)  Insurance report was not up to date. No action needed at this time.  Medication has been refilled as of 12/14/23 though this time 15 tablets for a 30 day supply.  Adherence may be lower 2/2 taking lower dose previously.  Reminder set for 1 month to ensure refill.

## 2023-12-18 DIAGNOSIS — M5136 Other intervertebral disc degeneration, lumbar region with discogenic back pain only: Secondary | ICD-10-CM | POA: Diagnosis not present

## 2023-12-18 DIAGNOSIS — M9903 Segmental and somatic dysfunction of lumbar region: Secondary | ICD-10-CM | POA: Diagnosis not present

## 2023-12-18 DIAGNOSIS — M9901 Segmental and somatic dysfunction of cervical region: Secondary | ICD-10-CM | POA: Diagnosis not present

## 2023-12-18 DIAGNOSIS — M9902 Segmental and somatic dysfunction of thoracic region: Secondary | ICD-10-CM | POA: Diagnosis not present

## 2023-12-26 DIAGNOSIS — M9903 Segmental and somatic dysfunction of lumbar region: Secondary | ICD-10-CM | POA: Diagnosis not present

## 2023-12-26 DIAGNOSIS — M5136 Other intervertebral disc degeneration, lumbar region with discogenic back pain only: Secondary | ICD-10-CM | POA: Diagnosis not present

## 2023-12-26 DIAGNOSIS — M9902 Segmental and somatic dysfunction of thoracic region: Secondary | ICD-10-CM | POA: Diagnosis not present

## 2023-12-26 DIAGNOSIS — M9901 Segmental and somatic dysfunction of cervical region: Secondary | ICD-10-CM | POA: Diagnosis not present

## 2024-01-01 DIAGNOSIS — M9903 Segmental and somatic dysfunction of lumbar region: Secondary | ICD-10-CM | POA: Diagnosis not present

## 2024-01-01 DIAGNOSIS — M5136 Other intervertebral disc degeneration, lumbar region with discogenic back pain only: Secondary | ICD-10-CM | POA: Diagnosis not present

## 2024-01-01 DIAGNOSIS — M9902 Segmental and somatic dysfunction of thoracic region: Secondary | ICD-10-CM | POA: Diagnosis not present

## 2024-01-01 DIAGNOSIS — M9901 Segmental and somatic dysfunction of cervical region: Secondary | ICD-10-CM | POA: Diagnosis not present

## 2024-01-25 DIAGNOSIS — M9902 Segmental and somatic dysfunction of thoracic region: Secondary | ICD-10-CM | POA: Diagnosis not present

## 2024-01-25 DIAGNOSIS — M5136 Other intervertebral disc degeneration, lumbar region with discogenic back pain only: Secondary | ICD-10-CM | POA: Diagnosis not present

## 2024-01-25 DIAGNOSIS — M9903 Segmental and somatic dysfunction of lumbar region: Secondary | ICD-10-CM | POA: Diagnosis not present

## 2024-01-25 DIAGNOSIS — M9901 Segmental and somatic dysfunction of cervical region: Secondary | ICD-10-CM | POA: Diagnosis not present

## 2024-02-26 ENCOUNTER — Encounter: Payer: Self-pay | Admitting: Pharmacist

## 2024-02-26 NOTE — Progress Notes (Signed)
 Pharmacy Quality Measure Review  This patient is appearing on a report for being at risk of failing the adherence measure for cholesterol (statin) and hypertension (ACEi/ARB) medications this calendar year.   Medication: atorvastatin  20 mg Last fill date: 01/17/24 for 30 day supply  Medication: losartan  25 mg Last fill date: 01/17/24 for 30 day supply  Insurance report was not up to date. No action needed at this time.  Both medications have been refilled as of 02/16/24 x30ds.   Next refill due 11/23. Reminder set.  Losartan  10 RF remaining.  Atorvastatin  1 RF remaining.  TOC scheduled May 2026.

## 2024-03-19 ENCOUNTER — Encounter: Payer: Self-pay | Admitting: Pharmacist

## 2024-03-19 NOTE — Progress Notes (Signed)
 Pharmacy Quality Measure Review  This patient is appearing on a report for being at risk of failing the adherence measure for cholesterol (statin) and hypertension (ACEi/ARB) medications this calendar year.   Medication: Atorvastatin , 20mg , losartan  25 mg  Insurance report was not up to date. No action needed at this time.  Confirmed, both medications have been filled as of 03/13/24 x30 ds. >80% for both.

## 2024-05-20 ENCOUNTER — Telehealth: Payer: Self-pay | Admitting: Internal Medicine

## 2024-05-20 NOTE — Telephone Encounter (Signed)
 Copied from CRM [id number]. Topic: Referral - Question >> May 17, 2024  3:17 PM Emylou G wrote: Reason for CRM: Patient called.. says he has several specialists appts coming up and needs the referral approvals to uploaded into the [facility] portal for insurance to approve. Has about 7 different places he sees.. Pls review/contact patient for update

## 2024-06-14 ENCOUNTER — Encounter

## 2024-09-10 ENCOUNTER — Ambulatory Visit

## 2024-09-12 ENCOUNTER — Encounter

## 2024-09-20 ENCOUNTER — Ambulatory Visit
